# Patient Record
Sex: Female | Born: 1965 | Race: Black or African American | Hispanic: No | Marital: Single | Smoking: Never smoker
Health system: Southern US, Community
[De-identification: ages and names within clinical notes are randomized; demographics above are authoritative.]

## PROBLEM LIST (undated history)

## (undated) DIAGNOSIS — F319 Bipolar disorder, unspecified: Secondary | ICD-10-CM

## (undated) DIAGNOSIS — I10 Essential (primary) hypertension: Secondary | ICD-10-CM

## (undated) DIAGNOSIS — I639 Cerebral infarction, unspecified: Secondary | ICD-10-CM

## (undated) DIAGNOSIS — K922 Gastrointestinal hemorrhage, unspecified: Secondary | ICD-10-CM

## (undated) HISTORY — PX: DILATION AND CURETTAGE OF UTERUS: SHX78

---

## 2007-04-09 ENCOUNTER — Emergency Department: Payer: Self-pay | Admitting: Internal Medicine

## 2007-07-13 ENCOUNTER — Other Ambulatory Visit: Payer: Self-pay

## 2007-07-13 ENCOUNTER — Emergency Department: Payer: Self-pay | Admitting: Emergency Medicine

## 2007-07-30 ENCOUNTER — Emergency Department: Payer: Self-pay | Admitting: Emergency Medicine

## 2007-12-21 ENCOUNTER — Emergency Department: Payer: Self-pay | Admitting: Internal Medicine

## 2007-12-21 ENCOUNTER — Emergency Department: Payer: Self-pay | Admitting: Emergency Medicine

## 2008-08-12 ENCOUNTER — Emergency Department: Payer: Self-pay | Admitting: Emergency Medicine

## 2009-02-15 ENCOUNTER — Emergency Department: Payer: Self-pay | Admitting: Internal Medicine

## 2009-02-16 ENCOUNTER — Emergency Department: Payer: Self-pay | Admitting: Internal Medicine

## 2009-02-17 ENCOUNTER — Emergency Department: Payer: Self-pay | Admitting: Emergency Medicine

## 2009-02-18 ENCOUNTER — Emergency Department: Payer: Self-pay | Admitting: Unknown Physician Specialty

## 2010-04-05 ENCOUNTER — Emergency Department: Payer: Self-pay | Admitting: Internal Medicine

## 2012-05-24 ENCOUNTER — Emergency Department: Payer: Self-pay | Admitting: Emergency Medicine

## 2012-05-24 LAB — URINALYSIS, COMPLETE
Bilirubin,UR: NEGATIVE
Glucose,UR: NEGATIVE mg/dL (ref 0–75)
Ph: 6 (ref 4.5–8.0)
RBC,UR: 4 /HPF (ref 0–5)
Specific Gravity: 1.023 (ref 1.003–1.030)
Squamous Epithelial: 13

## 2012-09-12 ENCOUNTER — Emergency Department: Payer: Self-pay | Admitting: Emergency Medicine

## 2012-10-18 ENCOUNTER — Emergency Department: Payer: Self-pay | Admitting: Emergency Medicine

## 2012-12-12 ENCOUNTER — Emergency Department: Payer: Self-pay | Admitting: Emergency Medicine

## 2012-12-12 LAB — URINALYSIS, COMPLETE
Blood: NEGATIVE
Glucose,UR: NEGATIVE mg/dL (ref 0–75)
Nitrite: NEGATIVE
Protein: NEGATIVE
RBC,UR: 3 /HPF (ref 0–5)
Specific Gravity: 1.02 (ref 1.003–1.030)
WBC UR: 7 /HPF (ref 0–5)

## 2012-12-12 LAB — COMPREHENSIVE METABOLIC PANEL
Alkaline Phosphatase: 69 U/L (ref 50–136)
BUN: 10 mg/dL (ref 7–18)
Calcium, Total: 9.2 mg/dL (ref 8.5–10.1)
Chloride: 103 mmol/L (ref 98–107)
Creatinine: 0.85 mg/dL (ref 0.60–1.30)
Glucose: 129 mg/dL — ABNORMAL HIGH (ref 65–99)
SGPT (ALT): 26 U/L (ref 12–78)
Sodium: 135 mmol/L — ABNORMAL LOW (ref 136–145)
Total Protein: 7.4 g/dL (ref 6.4–8.2)

## 2012-12-12 LAB — CBC
HCT: 40.5 % (ref 35.0–47.0)
MCH: 29.9 pg (ref 26.0–34.0)
MCHC: 33.8 g/dL (ref 32.0–36.0)
RBC: 4.57 10*6/uL (ref 3.80–5.20)

## 2012-12-12 LAB — PREGNANCY, URINE: Pregnancy Test, Urine: NEGATIVE m[IU]/mL

## 2012-12-12 LAB — LIPASE, BLOOD: Lipase: 119 U/L (ref 73–393)

## 2013-06-05 ENCOUNTER — Emergency Department: Payer: Self-pay | Admitting: Internal Medicine

## 2013-06-05 LAB — COMPREHENSIVE METABOLIC PANEL
Albumin: 3.9 g/dL (ref 3.4–5.0)
Anion Gap: 5 — ABNORMAL LOW (ref 7–16)
BUN: 7 mg/dL (ref 7–18)
Bilirubin,Total: 0.8 mg/dL (ref 0.2–1.0)
Calcium, Total: 9 mg/dL (ref 8.5–10.1)
Co2: 28 mmol/L (ref 21–32)
Creatinine: 0.86 mg/dL (ref 0.60–1.30)
EGFR (African American): >60
Glucose: 100 mg/dL — ABNORMAL HIGH (ref 65–99)
Osmolality: 270 (ref 275–301)
SGOT(AST): 30 U/L (ref 15–37)
Sodium: 136 mmol/L (ref 136–145)
Total Protein: 7.5 g/dL (ref 6.4–8.2)

## 2013-06-05 LAB — CBC
HCT: 39.8 % (ref 35.0–47.0)
HGB: 13.7 g/dL (ref 12.0–16.0)
MCH: 30.6 pg (ref 26.0–34.0)
MCHC: 34.4 g/dL (ref 32.0–36.0)
MCV: 89 fL (ref 80–100)
RBC: 4.47 10*6/uL (ref 3.80–5.20)
RDW: 15.1 % — ABNORMAL HIGH (ref 11.5–14.5)
WBC: 7.8 10*3/uL (ref 3.6–11.0)

## 2013-06-05 LAB — TROPONIN I: Troponin-I: <0.02 ng/mL

## 2013-09-08 ENCOUNTER — Emergency Department: Payer: Self-pay | Admitting: Emergency Medicine

## 2013-09-08 LAB — CBC WITH DIFFERENTIAL/PLATELET
Basophil #: 0.1 x10 3/mm 3
Basophil %: 1.2 %
Eosinophil #: 0.2 x10 3/mm 3
Eosinophil %: 2.6 %
HCT: 36.5 %
HGB: 12.3 g/dL
Lymphocyte %: 37 %
Lymphs Abs: 2.4 x10 3/mm 3
MCH: 31 pg
MCHC: 33.8 g/dL
MCV: 92 fL
Monocyte #: 0.4 "x10 3/mm "
Monocyte %: 5.8 %
Neutrophil #: 3.5 x10 3/mm 3
Neutrophil %: 53.4 %
Platelet: 288 x10 3/mm 3
RBC: 3.98 X10 6/mm 3
RDW: 15 % — ABNORMAL HIGH
WBC: 6.5 x10 3/mm 3

## 2013-09-08 LAB — BASIC METABOLIC PANEL WITH GFR
Anion Gap: 4 — ABNORMAL LOW
BUN: 13 mg/dL
Calcium, Total: 8.7 mg/dL
Chloride: 106 mmol/L
Co2: 29 mmol/L
Creatinine: 1.16 mg/dL
EGFR (African American): >60
EGFR (Non-African Amer.): 56 — ABNORMAL LOW
Glucose: 100 mg/dL — ABNORMAL HIGH
Osmolality: 278
Potassium: 3.4 mmol/L — ABNORMAL LOW
Sodium: 139 mmol/L

## 2013-10-01 ENCOUNTER — Emergency Department: Payer: Self-pay | Admitting: Emergency Medicine

## 2014-01-29 ENCOUNTER — Emergency Department: Payer: Self-pay | Admitting: Emergency Medicine

## 2014-01-31 ENCOUNTER — Emergency Department: Payer: Self-pay | Admitting: Emergency Medicine

## 2014-08-24 ENCOUNTER — Emergency Department: Payer: Self-pay | Admitting: Emergency Medicine

## 2014-08-24 LAB — TROPONIN I

## 2014-08-24 LAB — COMPREHENSIVE METABOLIC PANEL
ANION GAP: 6 — AB (ref 7–16)
AST: 24 U/L (ref 15–37)
Albumin: 3.6 g/dL (ref 3.4–5.0)
Alkaline Phosphatase: 58 U/L
BILIRUBIN TOTAL: 0.7 mg/dL (ref 0.2–1.0)
BUN: 10 mg/dL (ref 7–18)
CHLORIDE: 104 mmol/L (ref 98–107)
CREATININE: 0.85 mg/dL (ref 0.60–1.30)
Calcium, Total: 8.6 mg/dL (ref 8.5–10.1)
Co2: 27 mmol/L (ref 21–32)
EGFR (African American): >60
EGFR (Non-African Amer.): >60
Glucose: 115 mg/dL — ABNORMAL HIGH (ref 65–99)
Osmolality: 274 (ref 275–301)
POTASSIUM: 3.2 mmol/L — AB (ref 3.5–5.1)
SGPT (ALT): 32 U/L
SODIUM: 137 mmol/L (ref 136–145)
TOTAL PROTEIN: 6.9 g/dL (ref 6.4–8.2)

## 2014-08-24 LAB — CBC
HCT: 40.5 % (ref 35.0–47.0)
HGB: 13.5 g/dL (ref 12.0–16.0)
MCH: 30.7 pg (ref 26.0–34.0)
MCHC: 33.3 g/dL (ref 32.0–36.0)
MCV: 92 fL (ref 80–100)
Platelet: 347 10*3/uL (ref 150–440)
RBC: 4.4 10*6/uL (ref 3.80–5.20)
RDW: 14.4 % (ref 11.5–14.5)
WBC: 8.2 10*3/uL (ref 3.6–11.0)

## 2014-08-24 IMAGING — CR DG CHEST 1V PORT
1 series · 1 of 1 positions shown · non-contrast
Comparison: [DATE].

CLINICAL DATA: Pt states she has had a cough for a month now,
states about 2 weeks ago she started feeling pain in her chest,
hurts with movement, states the pain is sharp and concerning.

EXAM:
PORTABLE CHEST - 1 VIEW

[ap]
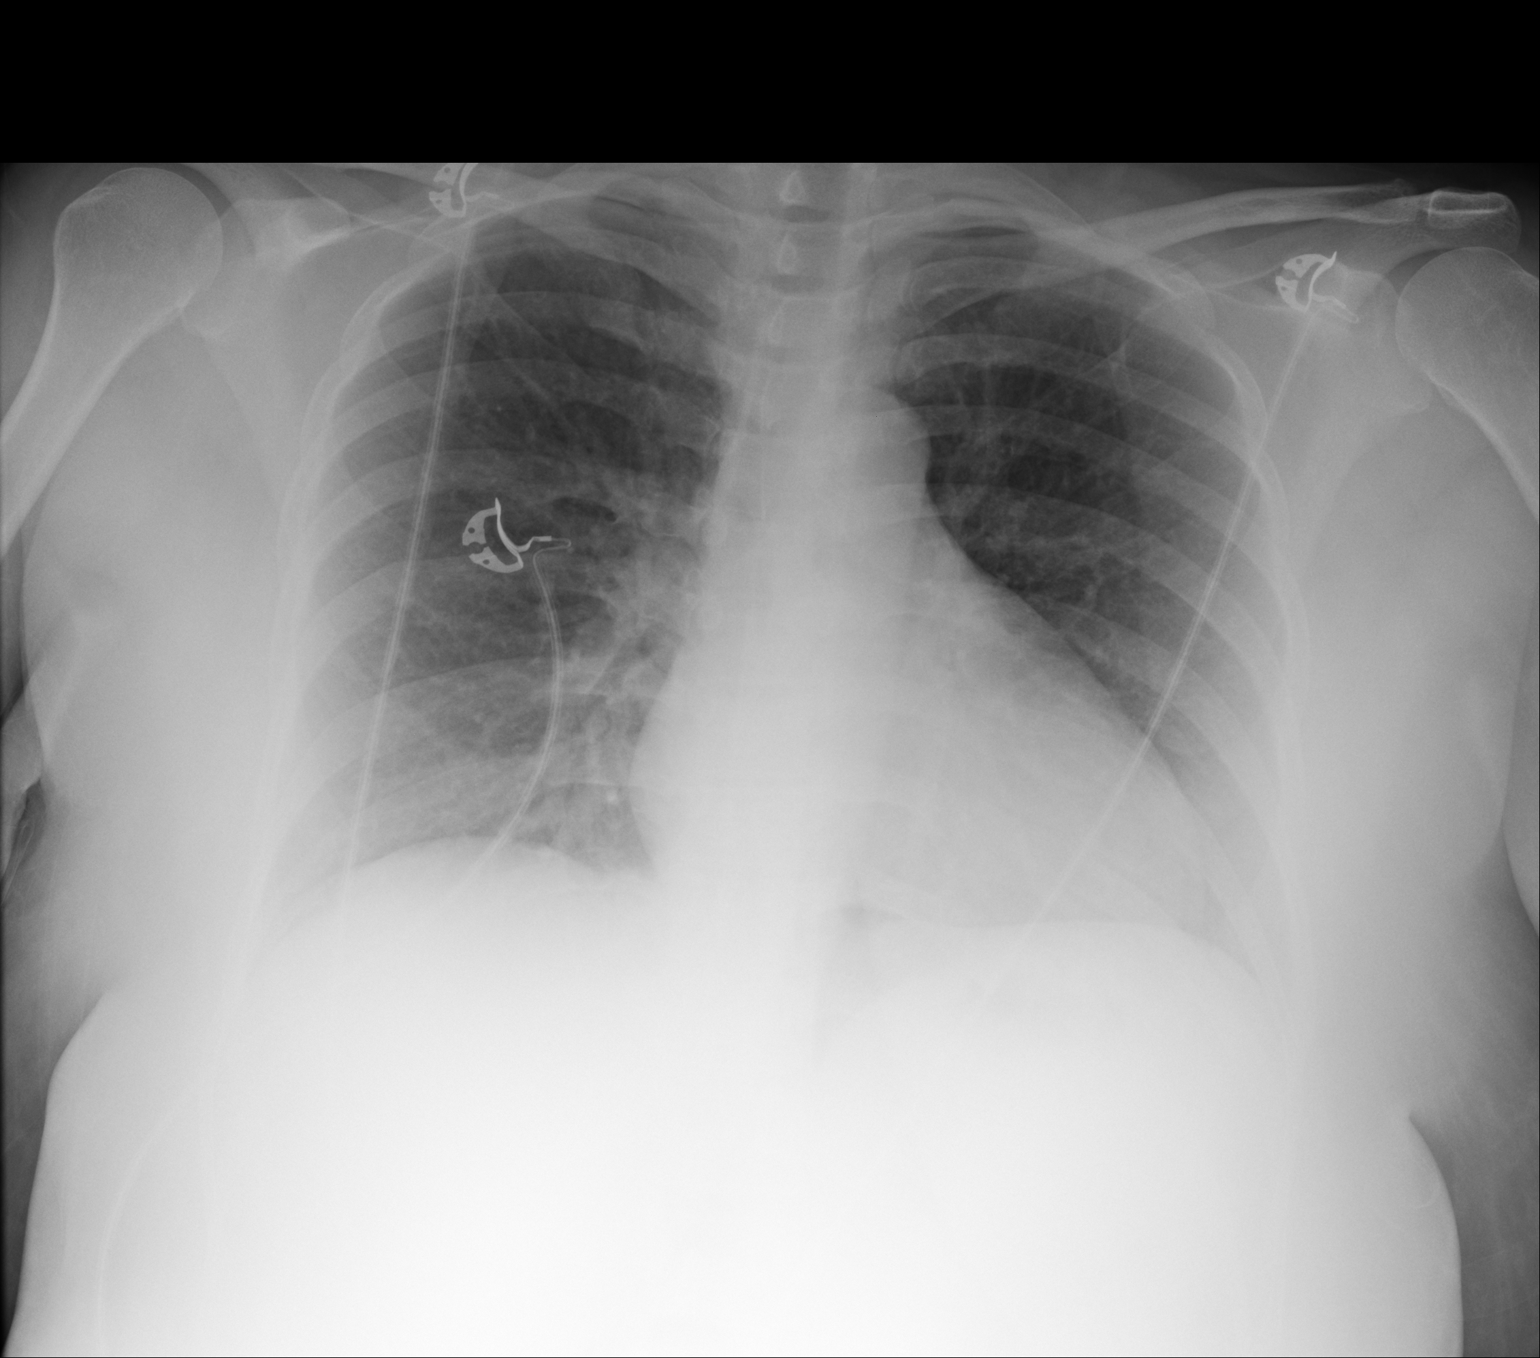

[1 of 1 positions shown; findings below may reference images not displayed]

FINDINGS: The cardiac silhouette remains borderline enlarged. The lungs remain
clear with stable mildly prominent pulmonary vasculature. No pleural
fluid. Unremarkable bones.
IMPRESSION: Stable borderline cardiomegaly and mild pulmonary vascular
congestion. No acute abnormality.

## 2014-12-26 LAB — CBC AND DIFFERENTIAL
HCT: 43 % (ref 36–46)
Hemoglobin: 14.5 g/dL (ref 12.0–16.0)
Neutrophils Absolute: 57 /uL
PLATELETS: 413 10*3/uL — AB (ref 150–399)
WBC: 6 10^3/mL

## 2014-12-26 LAB — LIPID PANEL
CHOLESTEROL: 242 mg/dL — AB (ref 0–200)
HDL: 46 mg/dL (ref 35–70)
LDL Cholesterol: 180 mg/dL
LDL/HDL RATIO: 3.9
Triglycerides: 82 mg/dL (ref 40–160)

## 2014-12-26 LAB — HEPATIC FUNCTION PANEL
ALK PHOS: 67 U/L (ref 25–125)
ALT: 21 U/L (ref 7–35)
AST: 19 U/L (ref 13–35)

## 2014-12-26 LAB — BASIC METABOLIC PANEL
BUN: 11 mg/dL (ref 4–21)
Creatinine: 0.8 mg/dL (ref <=1.1)
Glucose: 99 mg/dL
Potassium: 3.7 mmol/L (ref 3.4–5.3)
Sodium: 140 mmol/L (ref 137–147)

## 2014-12-26 LAB — TSH: TSH: 0.86 u[IU]/mL (ref <=5.90)

## 2015-01-14 DIAGNOSIS — F419 Anxiety disorder, unspecified: Secondary | ICD-10-CM

## 2015-01-14 DIAGNOSIS — E669 Obesity, unspecified: Secondary | ICD-10-CM | POA: Insufficient documentation

## 2015-01-14 DIAGNOSIS — I10 Essential (primary) hypertension: Secondary | ICD-10-CM | POA: Insufficient documentation

## 2015-01-14 DIAGNOSIS — F329 Major depressive disorder, single episode, unspecified: Secondary | ICD-10-CM | POA: Insufficient documentation

## 2015-01-14 DIAGNOSIS — K219 Gastro-esophageal reflux disease without esophagitis: Secondary | ICD-10-CM | POA: Insufficient documentation

## 2015-01-21 ENCOUNTER — Ambulatory Visit: Payer: Self-pay | Admitting: Family Medicine

## 2015-05-30 ENCOUNTER — Emergency Department: Payer: No Typology Code available for payment source

## 2015-05-30 ENCOUNTER — Emergency Department
Admission: EM | Admit: 2015-05-30 | Discharge: 2015-05-30 | Disposition: A | Discharge status: Home / Self Care | Payer: No Typology Code available for payment source | Attending: Student | Admitting: Student

## 2015-05-30 DIAGNOSIS — I10 Essential (primary) hypertension: Secondary | ICD-10-CM | POA: Diagnosis not present

## 2015-05-30 DIAGNOSIS — Y998 Other external cause status: Secondary | ICD-10-CM | POA: Insufficient documentation

## 2015-05-30 DIAGNOSIS — Y9389 Activity, other specified: Secondary | ICD-10-CM | POA: Insufficient documentation

## 2015-05-30 DIAGNOSIS — S8001XA Contusion of right knee, initial encounter: Secondary | ICD-10-CM | POA: Insufficient documentation

## 2015-05-30 DIAGNOSIS — Z79899 Other long term (current) drug therapy: Secondary | ICD-10-CM | POA: Diagnosis not present

## 2015-05-30 DIAGNOSIS — Y92410 Unspecified street and highway as the place of occurrence of the external cause: Secondary | ICD-10-CM | POA: Insufficient documentation

## 2015-05-30 DIAGNOSIS — S8991XA Unspecified injury of right lower leg, initial encounter: Secondary | ICD-10-CM | POA: Diagnosis present

## 2015-05-30 HISTORY — DX: Essential (primary) hypertension: I10

## 2015-05-30 HISTORY — DX: Bipolar disorder, unspecified: F31.9

## 2015-05-30 MED ORDER — HYDROCODONE-ACETAMINOPHEN 5-325 MG PO TABS
1.0000 | ORAL_TABLET | ORAL | Status: AC
  Administered 2015-05-30: 1 via ORAL
  Filled 2015-05-30: qty 1

## 2015-05-30 MED ORDER — MELOXICAM 15 MG PO TABS: 15.0000 mg | ORAL_TABLET | Freq: Every day | ORAL | Status: DC

## 2015-05-30 MED ORDER — IBUPROFEN 800 MG PO TABS
800.0000 mg | ORAL_TABLET | Freq: Once | ORAL | Status: AC
  Administered 2015-05-30: 800 mg via ORAL

## 2015-05-30 MED ORDER — IBUPROFEN 800 MG PO TABS
ORAL_TABLET | ORAL | Status: AC
  Administered 2015-05-30: 800 mg via ORAL
  Filled 2015-05-30: qty 1

## 2015-05-30 NOTE — Discharge Instructions (Signed)
Contusion °A contusion is a deep bruise. Contusions are the result of a blunt injury to tissues and muscle fibers under the skin. The injury causes bleeding under the skin. The skin overlying the contusion may turn blue, purple, or yellow. Minor injuries will give you a painless contusion, but more severe contusions may stay painful and swollen for a few weeks.  °CAUSES  °This condition is usually caused by a blow, trauma, or direct force to an area of the body. °SYMPTOMS  °Symptoms of this condition include: °· Swelling of the injured area. °· Pain and tenderness in the injured area. °· Discoloration. The area may have redness and then turn blue, purple, or yellow. °DIAGNOSIS  °This condition is diagnosed based on a physical exam and medical history. An X-ray, CT scan, or MRI may be needed to determine if there are any associated injuries, such as broken bones (fractures). °TREATMENT  °Specific treatment for this condition depends on what area of the body was injured. In general, the best treatment for a contusion is resting, icing, applying pressure to (compression), and elevating the injured area. This is often called the RICE strategy. Over-the-counter anti-inflammatory medicines may also be recommended for pain control.  °HOME CARE INSTRUCTIONS  °· Rest the injured area. °· If directed, apply ice to the injured area: °· Put ice in a plastic bag. °· Place a towel between your skin and the bag. °· Leave the ice on for 20 minutes, 2-3 times per day. °· If directed, apply light compression to the injured area using an elastic bandage. Make sure the bandage is not wrapped too tightly. Remove and reapply the bandage as directed by your health care provider. °· If possible, raise (elevate) the injured area above the level of your heart while you are sitting or lying down. °· Take over-the-counter and prescription medicines only as told by your health care provider. °SEEK MEDICAL CARE IF: °· Your symptoms do not  improve after several days of treatment. °· Your symptoms get worse. °· You have difficulty moving the injured area. °SEEK IMMEDIATE MEDICAL CARE IF:  °· You have severe pain. °· You have numbness in a hand or foot. °· Your hand or foot turns pale or cold. °  °This information is not intended to replace advice given to you by your health care provider. Make sure you discuss any questions you have with your health care provider. °  °Document Released: 05/05/2005 Document Revised: 04/16/2015 Document Reviewed: 12/11/2014 °Elsevier Interactive Patient Education ©2016 [employer] ° °Cryotherapy °Cryotherapy means treatment with cold. Ice or gel packs can be used to reduce both pain and swelling. Ice is the most helpful within the first 24 to 48 hours after an injury or flare-up from overusing a muscle or joint. Sprains, strains, spasms, burning pain, shooting pain, and aches can all be eased with ice. Ice can also be used when recovering from surgery. Ice is effective, has very few side effects, and is safe for most people to use. °PRECAUTIONS  °Ice is not a safe treatment option for people with: °· Raynaud phenomenon. This is a condition affecting small blood vessels in the extremities. Exposure to cold may cause your problems to return. °· Cold hypersensitivity. There are many forms of cold hypersensitivity, including: °¨ Cold urticaria. Red, itchy hives appear on the skin when the tissues begin to warm after being iced. °¨ Cold erythema. This is a red, itchy rash caused by exposure to cold. °¨ Cold hemoglobinuria. Red blood cells   break down when the tissues begin to warm after being iced. The hemoglobin that carry oxygen are passed into the urine because they cannot combine with blood proteins fast enough. °· Numbness or altered sensitivity in the area being iced. °If you have any of the following conditions, do not use ice until you have discussed cryotherapy with your caregiver: °· Heart conditions, such as  arrhythmia, angina, or chronic heart disease. °· High blood pressure. °· Healing wounds or open skin in the area being iced. °· Current infections. °· Rheumatoid arthritis. °· Poor circulation. °· Diabetes. °Ice slows the blood flow in the region it is applied. This is beneficial when trying to stop inflamed tissues from spreading irritating chemicals to surrounding tissues. However, if you expose your skin to cold temperatures for too long or without the proper protection, you can damage your skin or nerves. Watch for signs of skin damage due to cold. °HOME CARE INSTRUCTIONS °Follow these tips to use ice and cold packs safely. °· Place a dry or damp towel between the ice and skin. A damp towel will cool the skin more quickly, so you may need to shorten the time that the ice is used. °· For a more rapid response, add gentle compression to the ice. °· Ice for no more than 10 to 20 minutes at a time. The bonier the area you are icing, the less time it will take to get the benefits of ice. °· Check your skin after 5 minutes to make sure there are no signs of a poor response to cold or skin damage. °· Rest 20 minutes or more between uses. °· Once your skin is numb, you can end your treatment. You can test numbness by very lightly touching your skin. The touch should be so light that you do not see the skin dimple from the pressure of your fingertip. When using ice, most people will feel these normal sensations in this order: cold, burning, aching, and numbness. °· Do not use ice on someone who cannot communicate their responses to pain, such as small children or people with dementia. °HOW TO MAKE AN ICE PACK °Ice packs are the most common way to use ice therapy. Other methods include ice massage, ice baths, and cryosprays. Muscle creams that cause a cold, tingly feeling do not offer the same benefits that ice offers and should not be used as a substitute unless recommended by your caregiver. °To make an ice pack, do one  of the following: °· Place crushed ice or a bag of frozen vegetables in a sealable plastic bag. Squeeze out the excess air. Place this bag inside another plastic bag. Slide the bag into a pillowcase or place a damp towel between your skin and the bag. °· Mix 3 parts water with 1 part rubbing alcohol. Freeze the mixture in a sealable plastic bag. When you remove the mixture from the freezer, it will be slushy. Squeeze out the excess air. Place this bag inside another plastic bag. Slide the bag into a pillowcase or place a damp towel between your skin and the bag. °SEEK MEDICAL CARE IF: °· You develop white spots on your skin. This may give the skin a blotchy (mottled) appearance. °· Your skin turns blue or pale. °· Your skin becomes waxy or hard. °· Your swelling gets worse. °MAKE SURE YOU:  °· Understand these instructions. °· Will watch your condition. °· Will get help right away if you are not doing well or get worse. °  °  This information is not intended to replace advice given to you by your health care provider. Make sure you discuss any questions you have with your health care provider. °  °Document Released: 03/22/2011 Document Revised: 08/16/2014 Document Reviewed: 03/22/2011 °Elsevier Interactive Patient Education ©2016 Elsevier Inc. ° °
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
## 2016-03-31 ENCOUNTER — Encounter: Payer: Self-pay | Admitting: Emergency Medicine

## 2016-03-31 DIAGNOSIS — F419 Anxiety disorder, unspecified: Secondary | ICD-10-CM | POA: Insufficient documentation

## 2016-03-31 DIAGNOSIS — K8010 Calculus of gallbladder with chronic cholecystitis without obstruction: Principal | ICD-10-CM | POA: Insufficient documentation

## 2016-03-31 DIAGNOSIS — F319 Bipolar disorder, unspecified: Secondary | ICD-10-CM | POA: Insufficient documentation

## 2016-03-31 DIAGNOSIS — I10 Essential (primary) hypertension: Secondary | ICD-10-CM | POA: Insufficient documentation

## 2016-03-31 DIAGNOSIS — Z6829 Body mass index (BMI) 29.0-29.9, adult: Secondary | ICD-10-CM | POA: Insufficient documentation

## 2016-03-31 DIAGNOSIS — K219 Gastro-esophageal reflux disease without esophagitis: Secondary | ICD-10-CM | POA: Insufficient documentation

## 2016-03-31 DIAGNOSIS — E669 Obesity, unspecified: Secondary | ICD-10-CM | POA: Insufficient documentation

## 2016-03-31 LAB — CBC
HCT: 40 % (ref 35.0–47.0)
HEMOGLOBIN: 14.2 g/dL (ref 12.0–16.0)
MCH: 31.7 pg (ref 26.0–34.0)
MCHC: 35.4 g/dL (ref 32.0–36.0)
MCV: 89.6 fL (ref 80.0–100.0)
PLATELETS: 334 10*3/uL (ref 150–440)
RBC: 4.47 MIL/uL (ref 3.80–5.20)
RDW: 14.7 % — ABNORMAL HIGH (ref 11.5–14.5)
WBC: 7.6 10*3/uL (ref 3.6–11.0)

## 2016-03-31 LAB — COMPREHENSIVE METABOLIC PANEL
ALK PHOS: 57 U/L (ref 38–126)
ALT: 18 U/L (ref 14–54)
ANION GAP: 7 (ref 5–15)
AST: 22 U/L (ref 15–41)
Albumin: 4.4 g/dL (ref 3.5–5.0)
BILIRUBIN TOTAL: 0.9 mg/dL (ref 0.3–1.2)
BUN: 10 mg/dL (ref 6–20)
CALCIUM: 9.2 mg/dL (ref 8.9–10.3)
CO2: 28 mmol/L (ref 22–32)
CREATININE: 0.99 mg/dL (ref 0.44–1.00)
Chloride: 104 mmol/L (ref 101–111)
Glucose, Bld: 95 mg/dL (ref 65–99)
Potassium: 3.4 mmol/L — ABNORMAL LOW (ref 3.5–5.1)
SODIUM: 139 mmol/L (ref 135–145)
TOTAL PROTEIN: 7.4 g/dL (ref 6.5–8.1)

## 2016-03-31 LAB — LIPASE, BLOOD: Lipase: 21 U/L (ref 11–51)

## 2016-03-31 LAB — URINALYSIS COMPLETE WITH MICROSCOPIC (ARMC ONLY)
Bacteria, UA: NONE SEEN
Bilirubin Urine: NEGATIVE
Glucose, UA: NEGATIVE mg/dL
HGB URINE DIPSTICK: NEGATIVE
NITRITE: NEGATIVE
PROTEIN: 30 mg/dL — AB
SPECIFIC GRAVITY, URINE: 1.025 (ref 1.005–1.030)
pH: 6 (ref 5.0–8.0)

## 2016-03-31 NOTE — ED Triage Notes (Signed)
Pt presents to ED with c/o mid abdominal pain and epigastric pain for the past 3-4 months, states pain has increased in the last 3 days. Pt reports PCP dx 3 months ago with acid reflux, was given Ranitidine, states is not relieving symptoms. Pt has hx of HTN states did not take blood pressure medicine due to pain. Pt denies chest pain or shortness of breath, denies urinary symptoms. Reports diarrhea for the past 3 days. Pt alert and oriented x 4, no increased work in breathing noted.

## 2016-03-31 NOTE — ED Notes (Signed)
POC urine preg results -NEGATIVE

## 2016-04-01 ENCOUNTER — Observation Stay
Admission: EM | Admit: 2016-04-01 | Discharge: 2016-04-03 | Disposition: A | Discharge status: Home / Self Care | Payer: Self-pay | Attending: Surgery | Admitting: Surgery

## 2016-04-01 ENCOUNTER — Emergency Department: Payer: Self-pay

## 2016-04-01 DIAGNOSIS — R1011 Right upper quadrant pain: Secondary | ICD-10-CM

## 2016-04-01 DIAGNOSIS — K8000 Calculus of gallbladder with acute cholecystitis without obstruction: Secondary | ICD-10-CM

## 2016-04-01 DIAGNOSIS — K810 Acute cholecystitis: Secondary | ICD-10-CM

## 2016-04-01 LAB — COMPREHENSIVE METABOLIC PANEL
ALK PHOS: 55 U/L (ref 38–126)
ALT: 18 U/L (ref 14–54)
ANION GAP: 6 (ref 5–15)
AST: 20 U/L (ref 15–41)
Albumin: 4.2 g/dL (ref 3.5–5.0)
BUN: 12 mg/dL (ref 6–20)
CALCIUM: 9.1 mg/dL (ref 8.9–10.3)
CHLORIDE: 104 mmol/L (ref 101–111)
CO2: 31 mmol/L (ref 22–32)
CREATININE: 0.85 mg/dL (ref 0.44–1.00)
Glucose, Bld: 90 mg/dL (ref 65–99)
Potassium: 3.3 mmol/L — ABNORMAL LOW (ref 3.5–5.1)
SODIUM: 141 mmol/L (ref 135–145)
Total Bilirubin: 0.9 mg/dL (ref 0.3–1.2)
Total Protein: 7.4 g/dL (ref 6.5–8.1)

## 2016-04-01 LAB — CBC
HCT: 40.9 % (ref 35.0–47.0)
HEMOGLOBIN: 13.9 g/dL (ref 12.0–16.0)
MCH: 31.1 pg (ref 26.0–34.0)
MCHC: 33.9 g/dL (ref 32.0–36.0)
MCV: 91.7 fL (ref 80.0–100.0)
PLATELETS: 306 10*3/uL (ref 150–440)
RBC: 4.46 MIL/uL (ref 3.80–5.20)
RDW: 14.8 % — AB (ref 11.5–14.5)
WBC: 7.5 10*3/uL (ref 3.6–11.0)

## 2016-04-01 LAB — SURGICAL PCR SCREEN
MRSA, PCR: NEGATIVE
Staphylococcus aureus: NEGATIVE

## 2016-04-01 LAB — POCT PREGNANCY, URINE: Preg Test, Ur: NEGATIVE

## 2016-04-01 MED ORDER — HYDROCODONE-ACETAMINOPHEN 5-325 MG PO TABS
1.0000 | ORAL_TABLET | ORAL | Status: DC | PRN
  Administered 2016-04-01: 1 via ORAL
  Administered 2016-04-02: 2 via ORAL
  Administered 2016-04-02: 1 via ORAL
  Administered 2016-04-03 (×2): 2 via ORAL
  Filled 2016-04-01: qty 2
  Filled 2016-04-01 (×2): qty 1
  Filled 2016-04-01 (×2): qty 2

## 2016-04-01 MED ORDER — HYDRALAZINE HCL 20 MG/ML IJ SOLN
10.0000 mg | Freq: Four times a day (QID) | INTRAMUSCULAR | Status: DC | PRN
  Administered 2016-04-01 – 2016-04-03 (×2): 10 mg via INTRAVENOUS
  Filled 2016-04-01 (×2): qty 1

## 2016-04-01 MED ORDER — VENLAFAXINE HCL ER 75 MG PO CP24
75.0000 mg | ORAL_CAPSULE | Freq: Every day | ORAL | Status: DC
  Administered 2016-04-01 – 2016-04-03 (×2): 75 mg via ORAL
  Filled 2016-04-01 (×3): qty 1

## 2016-04-01 MED ORDER — LISINOPRIL 10 MG PO TABS
10.0000 mg | ORAL_TABLET | Freq: Every day | ORAL | Status: DC
  Administered 2016-04-01 – 2016-04-03 (×2): 10 mg via ORAL
  Filled 2016-04-01 (×2): qty 1

## 2016-04-01 MED ORDER — CEFAZOLIN IN D5W 1 GM/50ML IV SOLN
1.0000 g | Freq: Four times a day (QID) | INTRAVENOUS | Status: DC
  Administered 2016-04-01 – 2016-04-03 (×9): 1 g via INTRAVENOUS
  Filled 2016-04-01 (×12): qty 50

## 2016-04-01 MED ORDER — ONDANSETRON HCL 4 MG/2ML IJ SOLN: 4.0000 mg | Freq: Four times a day (QID) | INTRAMUSCULAR | Status: DC | PRN

## 2016-04-01 MED ORDER — ONDANSETRON HCL 4 MG/2ML IJ SOLN
4.0000 mg | Freq: Once | INTRAMUSCULAR | Status: AC
  Administered 2016-04-01: 4 mg via INTRAVENOUS
  Filled 2016-04-01: qty 2

## 2016-04-01 MED ORDER — ACETAMINOPHEN 325 MG PO TABS
650.0000 mg | ORAL_TABLET | Freq: Four times a day (QID) | ORAL | Status: DC | PRN
  Administered 2016-04-01: 650 mg via ORAL
  Filled 2016-04-01: qty 2

## 2016-04-01 MED ORDER — LISINOPRIL-HYDROCHLOROTHIAZIDE 10-12.5 MG PO TABS: 1.0000 | ORAL_TABLET | Freq: Every day | ORAL | Status: DC

## 2016-04-01 MED ORDER — HYDROCHLOROTHIAZIDE 12.5 MG PO CAPS
12.5000 mg | ORAL_CAPSULE | Freq: Every day | ORAL | Status: DC
  Administered 2016-04-01 – 2016-04-03 (×2): 12.5 mg via ORAL
  Filled 2016-04-01 (×2): qty 1

## 2016-04-01 MED ORDER — LACTATED RINGERS IV SOLN
INTRAVENOUS | Status: DC
  Administered 2016-04-01 – 2016-04-03 (×6): via INTRAVENOUS

## 2016-04-01 MED ORDER — HEPARIN SODIUM (PORCINE) 5000 UNIT/ML IJ SOLN
5000.0000 [IU] | Freq: Three times a day (TID) | INTRAMUSCULAR | Status: DC
  Administered 2016-04-01 – 2016-04-03 (×7): 5000 [IU] via SUBCUTANEOUS
  Filled 2016-04-01 (×7): qty 1

## 2016-04-01 MED ORDER — MORPHINE SULFATE (PF) 2 MG/ML IV SOLN
2.0000 mg | INTRAVENOUS | Status: DC | PRN
  Administered 2016-04-01 – 2016-04-03 (×9): 2 mg via INTRAVENOUS
  Filled 2016-04-01 (×9): qty 1

## 2016-04-01 MED ORDER — MORPHINE SULFATE (PF) 2 MG/ML IV SOLN
2.0000 mg | Freq: Once | INTRAVENOUS | Status: AC
  Administered 2016-04-01: 2 mg via INTRAVENOUS
  Filled 2016-04-01: qty 1

## 2016-04-01 MED ORDER — ONDANSETRON HCL 4 MG PO TABS: 4.0000 mg | ORAL_TABLET | Freq: Four times a day (QID) | ORAL | Status: DC | PRN

## 2016-04-01 NOTE — Progress Notes (Signed)
Admitted for acute cholecystitis, still having pain for lap chole in am D./w the pt in detail about the procedure, risks and benefits and she agrees. May have clears

## 2016-04-01 NOTE — Care Management (Addendum)
Patient presented with abdominal pain.  Had been treated by her PCP for reflux.  She is diagnosed with acute cholecystitis.  She is scheduled for lap chole 8/25.  She is not employed (looking for a job) and does not have insurance.   Lives with her fiancee who receives disability.  She says she does not have any reason to pursue a disability application.  has been seen by Development worker, community.  Discussed possible medication needs at discharge:  pain med -  usually inexpensive; antibiotic- if needed have physician consider the [facility] generic list; medication management clinic.  Provided application.   Patient  with hypertension and on blood pressure medication   prior to admission . States she has no  dme and no issues accessing medical care [facility]- Dr Rosanna Randy)  , obtaining medications  or with transportation.

## 2016-04-01 NOTE — ED Notes (Signed)
Transporting to room 208-2C

## 2016-04-01 NOTE — ED Notes (Addendum)
Pt states 3-4 months ago she was dx with reflux, started Ranitidine 150mg . States that she eats spicy foods and drinks alcohol occasionally. States "I don't think it's reflux." Pt states pain worse after eating. Pt points to epigastric region when asked where pain is. States she feels burning and will taste food again like it's coming up. States last 3-4  Days pain worse and diarrhea 4x daily. Denies vomiting or fevers. Pt states hx HTN but hasn't taken meds recently.

## 2016-04-01 NOTE — H&P (Signed)
Rachael Aguilar is an 50 y.o. female.    Chief Complaint: Right upper quadrant pain  HPI: This patient with right upper quadrant pain that has been bothering her for 3 months but over the last 3 or 4 days it's been considerably worse and is associated fatty food intolerance but now it is unrelenting and she's having nausea and vomiting no fevers or chills no jaundice or acholic stools. Workup in the emergency room suggest acute cholecystitis  She does not work does not smoke and rarely drinks alcohol Family history is noncontributory  Past Medical History:  Diagnosis Date  . Bipolar depression ([place])   . Hypertension     Past Surgical History:  Procedure Laterality Date  . DILATION AND CURETTAGE OF UTERUS      Family History  Problem Relation Age of Onset  . Ovarian cancer Mother   . Throat cancer Mother   . Hypertension Mother   . Lupus Maternal Grandmother   . Rheum arthritis Maternal Aunt    Social History:  reports that she has never smoked. She has never used smokeless tobacco. She reports that she drinks about 6.0 oz of alcohol per week . Her drug history is not on file.  Allergies: No Known Allergies   (Not in a hospital admission)   Review of Systems  Constitutional: Negative for chills and fever.  HENT: Negative.   Eyes: Negative.   Respiratory: Negative.   Cardiovascular: Negative.   Gastrointestinal: Positive for abdominal pain, nausea and vomiting. Negative for blood in stool, constipation, diarrhea, heartburn and melena.  Genitourinary: Negative.   Musculoskeletal: Negative.   Skin: Negative.   Neurological: Negative.   Endo/Heme/Allergies: Negative.   Psychiatric/Behavioral: Negative.      Physical Exam:  BP (!) 160/107   Pulse 64   Temp 98.3 F (36.8 C) (Oral)   Resp 18   Ht 5' 4"  (1.626 m)   Wt 171 lb (77.6 kg)   LMP 03/14/2016 (Within Weeks)   SpO2 100%   BMI 29.35 kg/m   Physical Exam  Constitutional: She is oriented to person, place,  and time and well-developed, well-nourished, and in no distress. No distress.  HENT:  Head: Normocephalic and atraumatic.  Eyes: Right eye exhibits no discharge. Left eye exhibits no discharge. No scleral icterus.  Neck: Normal range of motion.  Cardiovascular: Normal rate, regular rhythm and normal heart sounds.   Pulmonary/Chest: Effort normal and breath sounds normal. No respiratory distress. She has no wheezes. She has no rales.  Abdominal: Soft. She exhibits no distension. There is tenderness. There is no rebound and no guarding.  Positive Murphy sign  Musculoskeletal: Normal range of motion. She exhibits no edema or tenderness.  Lymphadenopathy:    She has no cervical adenopathy.  Neurological: She is alert and oriented to person, place, and time.  Skin: Skin is warm and dry. No rash noted. She is not diaphoretic. No erythema.  Psychiatric: Mood and affect normal.  Vitals reviewed.       Results for orders placed or performed during the hospital encounter of 04/01/16 (from the past 48 hour(s))  Lipase, blood     Status: None   Collection Time: 03/31/16 10:50 PM  Result Value Ref Range   Lipase 21 11 - 51 U/L  Comprehensive metabolic panel     Status: Abnormal   Collection Time: 03/31/16 10:50 PM  Result Value Ref Range   Sodium 139 135 - 145 mmol/L   Potassium 3.4 (L) 3.5 - 5.1  mmol/L   Chloride 104 101 - 111 mmol/L   CO2 28 22 - 32 mmol/L   Glucose, Bld 95 65 - 99 mg/dL   BUN 10 6 - 20 mg/dL   Creatinine, Ser 0.99 0.44 - 1.00 mg/dL   Calcium 9.2 8.9 - 10.3 mg/dL   Total Protein 7.4 6.5 - 8.1 g/dL   Albumin 4.4 3.5 - 5.0 g/dL   AST 22 15 - 41 U/L   ALT 18 14 - 54 U/L   Alkaline Phosphatase 57 38 - 126 U/L   Total Bilirubin 0.9 0.3 - 1.2 mg/dL   GFR calc non Af Amer >60 >60 mL/min   GFR calc Af Amer >60 >60 mL/min    Comment: (NOTE) The eGFR has been calculated using the CKD EPI equation. This calculation has not been validated in all clinical situations. eGFR's  persistently <60 mL/min signify possible Chronic Kidney Disease.    Anion gap 7 5 - 15  CBC     Status: Abnormal   Collection Time: 03/31/16 10:50 PM  Result Value Ref Range   WBC 7.6 3.6 - 11.0 K/uL   RBC 4.47 3.80 - 5.20 MIL/uL   Hemoglobin 14.2 12.0 - 16.0 g/dL   HCT 40.0 35.0 - 47.0 %   MCV 89.6 80.0 - 100.0 fL   MCH 31.7 26.0 - 34.0 pg   MCHC 35.4 32.0 - 36.0 g/dL   RDW 14.7 (H) 11.5 - 14.5 %   Platelets 334 150 - 440 K/uL  Urinalysis complete, with microscopic     Status: Abnormal   Collection Time: 03/31/16 10:51 PM  Result Value Ref Range   Color, Urine YELLOW (A) YELLOW   APPearance HAZY (A) CLEAR   Glucose, UA NEGATIVE NEGATIVE mg/dL   Bilirubin Urine NEGATIVE NEGATIVE   Ketones, ur TRACE (A) NEGATIVE mg/dL   Specific Gravity, Urine 1.025 1.005 - 1.030   Hgb urine dipstick NEGATIVE NEGATIVE   pH 6.0 5.0 - 8.0   Protein, ur 30 (A) NEGATIVE mg/dL   Nitrite NEGATIVE NEGATIVE   Leukocytes, UA 3+ (A) NEGATIVE   RBC / HPF 6-30 0 - 5 RBC/hpf   WBC, UA TOO NUMEROUS TO COUNT 0 - 5 WBC/hpf   Bacteria, UA NONE SEEN NONE SEEN   Squamous Epithelial / LPF 6-30 (A) NONE SEEN   US Abdomen Limited Ruq  Result Date: 04/01/2016 CLINICAL DATA:  Right upper quadrant pain. Pain for months, worse over the past 3 days. EXAM: US ABDOMEN LIMITED - RIGHT UPPER QUADRANT COMPARISON:  None. FINDINGS: Gallbladder: Physiologically distended. Multiple small shadowing gallstones. There is gallbladder wall thickening measuring 3-4 mm. A positive sonographic Murphy sign noted by sonographer with tenderness scanning over the gallbladder. Common bile duct: Diameter: 4.3 mm Liver: No focal lesion identified. Within normal limits in parenchymal echogenicity. Normal directional flow in the main portal vein. IMPRESSION: Cholelithiasis with gallbladder wall thickening. In conjunction with a positive sonographic Murphy sign, findings are concerning for acute cholecystitis. No biliary dilatation. Electronically  Signed   By: Jeb Levering M.D.   On: 04/01/2016 02:28     Assessment/Plan  Labs and ultrasound personally reviewed. Agree with the diagnosis of acute cholecystitis. Discussed with the patient and her family member the options of observation and the rationale for admission to the hospital starting IV antibiotics and planning surgery in the next 24-36 hours. This is all reviewed for her I also discussed with her with her the risks of bleeding infection recurrence of symptoms failure to  resolve her symptoms (procedure bile duct damage bile duct leak any of which could require further surgery and that Dr. Perrin Maltese would be performing her surgery when OR time became available she understood and agreed to proceed  Florene Glen, MD, FACS

## 2016-04-01 NOTE — ED Provider Notes (Signed)
[facility] Emergency Department Provider Note  ____________________________________________   First MD Initiated Contact with Patient 04/01/16 0134     (approximate)  I have reviewed the triage vital signs and the nursing notes.   HISTORY  Chief Complaint Abdominal Pain    HPI Rachael Aguilar is a 50 y.o. female presents with 3-4 month history of intermittent upper abdominal pain associated with nausea. Patient states over the past 3 days however pain has worsened unrelieved with ranitidine which was prescribed by her PCP. Patient states current pain score is 10 out of 10. She denies any fever no diarrhea and. Patient does however admit to urinary frequency and urgency.   Past Medical History:  Diagnosis Date  . Bipolar depression ([place])   . Hypertension     Patient Active Problem List   Diagnosis Date Noted  . Anxiety and depression 01/14/2015  . Gastro-esophageal reflux disease without esophagitis 01/14/2015  . BP (high blood pressure) 01/14/2015  . Adiposity 01/14/2015    Past Surgical History:  Procedure Laterality Date  . DILATION AND CURETTAGE OF UTERUS      Prior to Admission medications   Medication Sig Start Date End Date Taking? Authorizing Provider  lisinopril-hydrochlorothiazide (PRINZIDE,ZESTORETIC) 10-12.5 MG per tablet Take by mouth. 12/26/14   Historical Provider, MD  meloxicam (MOBIC) 15 MG tablet Take 1 tablet (15 mg total) by mouth daily. 05/30/15   Duanne Guess, PA-C  ranitidine (ZANTAC) 150 MG capsule Take by mouth. 12/26/14   Historical Provider, MD  venlafaxine XR (EFFEXOR-XR) 75 MG 24 hr capsule Take by mouth. 12/26/14   Historical Provider, MD    Allergies No known drug allergies  Family History  Problem Relation Age of Onset  . Ovarian cancer Mother   . Throat cancer Mother   . Hypertension Mother   . Lupus Maternal Grandmother   . Rheum arthritis Maternal Aunt     Social History Social History  Substance  Use Topics  . Smoking status: Never Smoker  . Smokeless tobacco: Never Used  . Alcohol use 6.0 oz/week    10 Shots of liquor per week    Review of Systems Constitutional: No fever/chills Eyes: No visual changes. ENT: No sore throat. Cardiovascular: Denies chest pain. Respiratory: Denies shortness of breath. Gastrointestinal: No abdominal pain.  No nausea, no vomiting.  No diarrhea.  No constipation. Genitourinary: Negative for dysuria. Musculoskeletal: Negative for back pain. Skin: Negative for rash. Neurological: Negative for headaches, focal weakness or numbness.  10-point ROS otherwise negative.  ____________________________________________   PHYSICAL EXAM:  VITAL SIGNS: ED Triage Vitals  Enc Vitals Group     BP 03/31/16 2245 (!) 165/102     Pulse Rate 03/31/16 2245 78     Resp 03/31/16 2245 18     Temp 03/31/16 2245 98.3 F (36.8 C)     Temp Source 03/31/16 2245 Oral     SpO2 03/31/16 2245 98 %     Weight 03/31/16 2246 171 lb (77.6 kg)     Height 03/31/16 2246 5\' 4"  (1.626 m)     Head Circumference --      Peak Flow --      Pain Score 03/31/16 2247 10     Pain Loc --      Pain Edu? --      Excl. in [place]? --     Constitutional: Alert and oriented. Well appearing and in no acute distress. Eyes: Conjunctivae are normal. PERRL. EOMI. Head: Atraumatic. Mouth/Throat: Mucous  membranes are moist.  Oropharynx non-erythematous. Neck: No stridor.  No meningeal signs.   Cardiovascular: Normal rate, regular rhythm. Good peripheral circulation. Grossly normal heart sounds. Respiratory: Normal respiratory effort.  No retractions. Lungs CTAB. Gastrointestinal: Soft and nontender. No distention.  Musculoskeletal: No lower extremity tenderness nor edema. No gross deformities of extremities. Neurologic:  Normal speech and language. No gross focal neurologic deficits are appreciated.  Skin:  Skin is warm, dry and intact. No rash noted. Psychiatric: Mood and affect are normal.  Speech and behavior are normal.  ____________________________________________   LABS (all labs ordered are listed, but only abnormal results are displayed)  Labs Reviewed  COMPREHENSIVE METABOLIC PANEL - Abnormal; Notable for the following:       Result Value   Potassium 3.4 (*)    All other components within normal limits  CBC - Abnormal; Notable for the following:    RDW 14.7 (*)    All other components within normal limits  URINALYSIS COMPLETEWITH MICROSCOPIC ([facility] ONLY) - Abnormal; Notable for the following:    Color, Urine YELLOW (*)    APPearance HAZY (*)    Ketones, ur TRACE (*)    Protein, ur 30 (*)    Leukocytes, UA 3+ (*)    Squamous Epithelial / LPF 6-30 (*)    All other components within normal limits  LIPASE, BLOOD  POC URINE PREG, ED    RADIOLOGY I, Sherrard N BROWN, personally viewed and evaluated these images (plain radiographs) as part of my medical decision making, as well as reviewing the written report by the radiologist.  US Abdomen Limited Ruq  Result Date: 04/01/2016 CLINICAL DATA:  Right upper quadrant pain. Pain for months, worse over the past 3 days. EXAM: US ABDOMEN LIMITED - RIGHT UPPER QUADRANT COMPARISON:  None. FINDINGS: Gallbladder: Physiologically distended. Multiple small shadowing gallstones. There is gallbladder wall thickening measuring 3-4 mm. A positive sonographic Murphy sign noted by sonographer with tenderness scanning over the gallbladder. Common bile duct: Diameter: 4.3 mm Liver: No focal lesion identified. Within normal limits in parenchymal echogenicity. Normal directional flow in the main portal vein. IMPRESSION: Cholelithiasis with gallbladder wall thickening. In conjunction with a positive sonographic Murphy sign, findings are concerning for acute cholecystitis. No biliary dilatation. Electronically Signed   By: Jeb Levering M.D.   On: 04/01/2016 02:28     Procedures     INITIAL IMPRESSION / ASSESSMENT AND PLAN / ED  COURSE  Pertinent labs & imaging results that were available during my care of the patient were reviewed by me and considered in my medical decision making (see chart for details).  History, physical exam and ultrasound findings consistent with possible acute cholecystitis as such patient discussed with Dr. Burt Knack general surgeon on call who evaluated the patient with plan for admission for further management.   Clinical Course    ____________________________________________  FINAL CLINICAL IMPRESSION(S) / ED DIAGNOSES  Final diagnoses:  Acute cholecystitis  Calculus of gallbladder with acute cholecystitis without obstruction     MEDICATIONS GIVEN DURING THIS VISIT:  Medications  morphine 2 MG/ML injection 2 mg (2 mg Intravenous Given 04/01/16 0321)  ondansetron (ZOFRAN) injection 4 mg (4 mg Intravenous Given 04/01/16 0321)     NEW OUTPATIENT MEDICATIONS STARTED DURING THIS VISIT:  New Prescriptions   No medications on file      Note:  This document was prepared using Dragon voice recognition software and may include unintentional dictation errors.    Gregor Hams, MD 04/01/16 0330

## 2016-04-02 ENCOUNTER — Observation Stay: Payer: Self-pay | Admitting: Anesthesiology

## 2016-04-02 ENCOUNTER — Encounter
Admission: EM | Disposition: A | Discharge status: Home / Self Care | Payer: Self-pay | Source: Home / Self Care | Attending: Emergency Medicine

## 2016-04-02 ENCOUNTER — Encounter: Payer: Self-pay | Admitting: Anesthesiology

## 2016-04-02 DIAGNOSIS — K810 Acute cholecystitis: Secondary | ICD-10-CM

## 2016-04-02 HISTORY — PX: CHOLECYSTECTOMY: SHX55

## 2016-04-02 SURGERY — LAPAROSCOPIC CHOLECYSTECTOMY
Anesthesia: General | Site: Abdomen | Wound class: Clean

## 2016-04-02 MED ORDER — MIDAZOLAM HCL 5 MG/5ML IJ SOLN
INTRAMUSCULAR | Status: DC | PRN
  Administered 2016-04-02: 2 mg via INTRAVENOUS

## 2016-04-02 MED ORDER — FENTANYL CITRATE (PF) 100 MCG/2ML IJ SOLN
INTRAMUSCULAR | Status: DC | PRN
  Administered 2016-04-02 (×2): 50 ug via INTRAVENOUS
  Administered 2016-04-02: 100 ug via INTRAVENOUS

## 2016-04-02 MED ORDER — ONDANSETRON HCL 4 MG/2ML IJ SOLN
INTRAMUSCULAR | Status: DC | PRN
  Administered 2016-04-02: 4 mg via INTRAVENOUS

## 2016-04-02 MED ORDER — DEXAMETHASONE SODIUM PHOSPHATE 10 MG/ML IJ SOLN
INTRAMUSCULAR | Status: DC | PRN
  Administered 2016-04-02: 10 mg via INTRAVENOUS

## 2016-04-02 MED ORDER — CHLORHEXIDINE GLUCONATE CLOTH 2 % EX PADS: 6.0000 | MEDICATED_PAD | Freq: Once | CUTANEOUS | Status: DC

## 2016-04-02 MED ORDER — ACETAMINOPHEN 10 MG/ML IV SOLN
INTRAVENOUS | Status: DC | PRN
  Administered 2016-04-02: 1000 mg via INTRAVENOUS

## 2016-04-02 MED ORDER — FENTANYL CITRATE (PF) 100 MCG/2ML IJ SOLN
25.0000 ug | INTRAMUSCULAR | Status: DC | PRN
  Administered 2016-04-02 (×4): 25 ug via INTRAVENOUS

## 2016-04-02 MED ORDER — PROPOFOL 10 MG/ML IV BOLUS
INTRAVENOUS | Status: DC | PRN
  Administered 2016-04-02: 170 mg via INTRAVENOUS

## 2016-04-02 MED ORDER — SUCCINYLCHOLINE CHLORIDE 20 MG/ML IJ SOLN
INTRAMUSCULAR | Status: DC | PRN
  Administered 2016-04-02: 120 mg via INTRAVENOUS

## 2016-04-02 MED ORDER — ROCURONIUM BROMIDE 100 MG/10ML IV SOLN
INTRAVENOUS | Status: DC | PRN
  Administered 2016-04-02: 20 mg via INTRAVENOUS
  Administered 2016-04-02: 10 mg via INTRAVENOUS

## 2016-04-02 MED ORDER — OXYCODONE HCL 5 MG/5ML PO SOLN
5.0000 mg | Freq: Once | ORAL | Status: DC | PRN
Start: 2016-04-02 — End: 2016-04-02

## 2016-04-02 MED ORDER — FENTANYL CITRATE (PF) 100 MCG/2ML IJ SOLN
INTRAMUSCULAR | Status: AC
  Administered 2016-04-02: 25 ug via INTRAVENOUS
  Filled 2016-04-02: qty 2

## 2016-04-02 MED ORDER — SUGAMMADEX SODIUM 200 MG/2ML IV SOLN
INTRAVENOUS | Status: DC | PRN
  Administered 2016-04-02: 160 mg via INTRAVENOUS

## 2016-04-02 MED ORDER — BUPIVACAINE-EPINEPHRINE 0.25% -1:200000 IJ SOLN
INTRAMUSCULAR | Status: DC | PRN
  Administered 2016-04-02: 30 mL

## 2016-04-02 MED ORDER — LIDOCAINE HCL (CARDIAC) 20 MG/ML IV SOLN
INTRAVENOUS | Status: DC | PRN
  Administered 2016-04-02: 60 mg via INTRAVENOUS

## 2016-04-02 MED ORDER — KETOROLAC TROMETHAMINE 30 MG/ML IJ SOLN
INTRAMUSCULAR | Status: DC | PRN
  Administered 2016-04-02: 30 mg via INTRAVENOUS

## 2016-04-02 MED ORDER — OXYCODONE HCL 5 MG PO TABS: 5.0000 mg | ORAL_TABLET | Freq: Once | ORAL | Status: DC | PRN

## 2016-04-02 SURGICAL SUPPLY — 44 items
APPLICATOR COTTON TIP 6IN STRL (MISCELLANEOUS) ×3 IMPLANT
APPLIER CLIP 5 13 M/L LIGAMAX5 (MISCELLANEOUS) ×3
BLADE SURG 15 STRL LF DISP TIS (BLADE) ×1 IMPLANT
BLADE SURG 15 STRL SS (BLADE) ×2
CANISTER SUCT 1200ML W/VALVE (MISCELLANEOUS) ×3 IMPLANT
CHLORAPREP W/TINT 26ML (MISCELLANEOUS) ×3 IMPLANT
CHOLANGIOGRAM CATH TAUT (CATHETERS) IMPLANT
CLEANER CAUTERY TIP 5X5 PAD (MISCELLANEOUS) ×1 IMPLANT
CLIP APPLIE 5 13 M/L LIGAMAX5 (MISCELLANEOUS) ×1 IMPLANT
DECANTER SPIKE VIAL GLASS SM (MISCELLANEOUS) ×6 IMPLANT
DEVICE TROCAR PUNCTURE CLOSURE (ENDOMECHANICALS) IMPLANT
DRAPE C-ARM XRAY 36X54 (DRAPES) ×3 IMPLANT
ELECT REM PT RETURN 9FT ADLT (ELECTROSURGICAL) ×3
ELECTRODE REM PT RTRN 9FT ADLT (ELECTROSURGICAL) ×1 IMPLANT
ENDOPOUCH RETRIEVER 10 (MISCELLANEOUS) ×3 IMPLANT
GLOVE BIO SURGEON STRL SZ7 (GLOVE) ×3 IMPLANT
GOWN STRL REUS W/ TWL LRG LVL3 (GOWN DISPOSABLE) ×3 IMPLANT
GOWN STRL REUS W/TWL LRG LVL3 (GOWN DISPOSABLE) ×6
IRRIGATION STRYKERFLOW (MISCELLANEOUS) ×1 IMPLANT
IRRIGATOR STRYKERFLOW (MISCELLANEOUS) ×3
IV CATH ANGIO 12GX3 LT BLUE (NEEDLE) ×3 IMPLANT
IV NS 1000ML (IV SOLUTION) ×2
IV NS 1000ML BAXH (IV SOLUTION) ×1 IMPLANT
L-HOOK LAP DISP 36CM (ELECTROSURGICAL) ×3
LHOOK LAP DISP 36CM (ELECTROSURGICAL) ×1 IMPLANT
LIQUID BAND (GAUZE/BANDAGES/DRESSINGS) ×3 IMPLANT
NEEDLE HYPO 22GX1.5 SAFETY (NEEDLE) ×3 IMPLANT
PACK LAP CHOLECYSTECTOMY (MISCELLANEOUS) ×3 IMPLANT
PAD CLEANER CAUTERY TIP 5X5 (MISCELLANEOUS) ×2
PENCIL ELECTRO HAND CTR (MISCELLANEOUS) ×3 IMPLANT
SCISSORS METZENBAUM CVD 33 (INSTRUMENTS) ×3 IMPLANT
SLEEVE ENDOPATH XCEL 5M (ENDOMECHANICALS) ×6 IMPLANT
SOL ANTI-FOG 6CC FOG-OUT (MISCELLANEOUS) ×1 IMPLANT
SOL FOG-OUT ANTI-FOG 6CC (MISCELLANEOUS) ×2
STOPCOCK 3 WAY  REPLAC (MISCELLANEOUS) IMPLANT
SUT ETHIBOND 0 MO6 C/R (SUTURE) IMPLANT
SUT MNCRL AB 4-0 PS2 18 (SUTURE) ×3 IMPLANT
SUT VIC AB 0 CT2 27 (SUTURE) IMPLANT
SUT VICRYL 0 AB UR-6 (SUTURE) ×6 IMPLANT
SYR 20CC LL (SYRINGE) ×3 IMPLANT
TROCAR XCEL BLUNT TIP 100MML (ENDOMECHANICALS) ×3 IMPLANT
TROCAR XCEL NON-BLD 5MMX100MML (ENDOMECHANICALS) ×3 IMPLANT
TUBING INSUFFLATOR HI FLOW (MISCELLANEOUS) ×3 IMPLANT
WATER STERILE IRR 1000ML POUR (IV SOLUTION) ×3 IMPLANT

## 2016-04-02 NOTE — Transfer of Care (Signed)
Immediate Anesthesia Transfer of Care Note  Patient: Rachael Aguilar  Procedure(s) Performed: Procedure(s): LAPAROSCOPIC CHOLECYSTECTOMY (N/A)  Patient Location: PACU  Anesthesia Type:General  Level of Consciousness: sedated  Airway & Oxygen Therapy: Patient Spontanous Breathing and Patient connected to face mask oxygen  Post-op Assessment: Report given to RN and Post -op Vital signs reviewed and stable  Post vital signs: Reviewed and stable  Last Vitals:  Vitals:   04/02/16 1004 04/02/16 1125  BP: (!) 161/105 138/86  Pulse: 62 96  Resp: 16 20  Temp: 36.4 C 36.3 C    Last Pain:  Vitals:   04/02/16 1125  TempSrc:   PainSc: Asleep      Patients Stated Pain Goal: 0 ([id number])  Complications: No apparent anesthesia complications

## 2016-04-02 NOTE — Anesthesia Preprocedure Evaluation (Signed)
Anesthesia Evaluation  Patient identified by MRN, date of birth, ID band Patient awake    Reviewed: Allergy & Precautions, H&P , NPO status , Patient's Chart, lab work & pertinent test results  History of Anesthesia Complications Negative for: history of anesthetic complications  Airway Mallampati: III  TM Distance: >3 FB Neck ROM: full    Dental no notable dental hx. (+) Poor Dentition, Chipped   Pulmonary neg pulmonary ROS, neg shortness of breath,    Pulmonary exam normal breath sounds clear to auscultation       Cardiovascular Exercise Tolerance: Good hypertension, (-) angina(-) Past MI and (-) DOE Normal cardiovascular exam Rhythm:regular Rate:Normal     Neuro/Psych PSYCHIATRIC DISORDERS Depression Bipolar Disorder negative neurological ROS     GI/Hepatic Neg liver ROS, GERD  Controlled,  Endo/Other  negative endocrine ROS  Renal/GU      Musculoskeletal   Abdominal   Peds  Hematology negative hematology ROS (+)   Anesthesia Other Findings Past Medical History: No date: Bipolar depression ([place]) No date: Hypertension  Past Surgical History: No date: DILATION AND CURETTAGE OF UTERUS  BMI    Body Mass Index:  29.13 kg/m      Reproductive/Obstetrics negative OB ROS                            Anesthesia Physical Anesthesia Plan  ASA: III  Anesthesia Plan: General ETT   Post-op Pain Management:    Induction:   Airway Management Planned:   Additional Equipment:   Intra-op Plan:   Post-operative Plan:   Informed Consent: I have reviewed the patients History and Physical, chart, labs and discussed the procedure including the risks, benefits and alternatives for the proposed anesthesia with the patient or authorized representative who has indicated his/her understanding and acceptance.     Plan Discussed with: Anesthesiologist, CRNA and Surgeon  Anesthesia Plan  Comments:         Anesthesia Quick Evaluation

## 2016-04-02 NOTE — Anesthesia Procedure Notes (Signed)
Procedure Name: Intubation Date/Time: 04/02/2016 10:30 AM Performed by: Dionne Bucy Pre-anesthesia Checklist: Patient identified, Patient being monitored, Timeout performed, Emergency Drugs available and Suction available Patient Re-evaluated:Patient Re-evaluated prior to inductionOxygen Delivery Method: Circle system utilized Preoxygenation: Pre-oxygenation with 100% oxygen Intubation Type: IV induction Ventilation: Mask ventilation without difficulty Laryngoscope Size: Mac and 3 Grade View: Grade I Tube type: Oral Tube size: 7.0 mm Number of attempts: 1 Airway Equipment and Method: Stylet Placement Confirmation: ETT inserted through vocal cords under direct vision,  positive ETCO2 and breath sounds checked- equal and bilateral Secured at: 21 cm Tube secured with: Tape Dental Injury: Teeth and Oropharynx as per pre-operative assessment

## 2016-04-02 NOTE — Progress Notes (Signed)
Seen and examined for lap chole today for cholecystitis. D/W the pt in detail. She wishes to proceed

## 2016-04-02 NOTE — Op Note (Addendum)
Laparoscopic Cholecystectomy  Pre-operative Diagnosis: Acute cholecystitis  Post-operative Diagnosis: Same  Procedure: Laparoscopic cholecystectomy  Surgeon: Caroleen Hamman, MD FACS  Anesthesia: Gen. with endotracheal tube   Findings: Mild Acute Cholecystitis   Estimated Blood Loss: 5cc         Drains: none         Specimens: Gallbladder           Complications: none   Procedure Details  The patient was seen again in the Holding Room. The benefits, complications, treatment options, and expected outcomes were discussed with the patient. The risks of bleeding, infection, recurrence of symptoms, failure to resolve symptoms, bile duct damage, bile duct leak, retained common bile duct stone, bowel injury, any of which could require further surgery and/or ERCP, stent, or papillotomy were reviewed with the patient. The likelihood of improving the patient's symptoms with return to their baseline status is good.  The patient and/or family concurred with the proposed plan, giving informed consent.  The patient was taken to Operating Room, identified as RAMSHA COLLER and the procedure verified as Laparoscopic Cholecystectomy.  A Time Out was held and the above information confirmed.  Prior to the induction of general anesthesia, antibiotic prophylaxis was administered. VTE prophylaxis was in place. General endotracheal anesthesia was then administered and tolerated well. After the induction, the abdomen was prepped with Chloraprep and draped in the sterile fashion. The patient was positioned in the supine position.  Local anesthetic  was injected into the skin near the umbilicus and an incision made. Cut down technique was used to enter the abdominal cavity and a Hasson trochar was placed after two vicryl stitches were anchored to the fascia. Pneumoperitoneum was then created with CO2 and tolerated well without any adverse changes in the patient's vital signs.  Three 5-mm ports were placed in the  right upper quadrant all under direct vision. All skin incisions  were infiltrated with a local anesthetic agent before making the incision and placing the trocars.   The patient was positioned  in reverse Trendelenburg, tilted slightly to the patient's left.  The gallbladder was identified, the fundus grasped and retracted cephalad. Adhesions were lysed bluntly. The infundibulum was grasped and retracted laterally, exposing the peritoneum overlying the triangle of Calot. This was then divided and exposed in a blunt fashion. An extended critical view of the cystic duct and cystic artery was obtained.  The cystic duct was clearly identified and bluntly dissected.   Artery and duct were double clipped and divided.  The gallbladder was taken from the gallbladder fossa in a retrograde fashion with the electrocautery. The gallbladder was removed and placed in an Endocatch bag. The liver bed was irrigated and inspected. Hemostasis was achieved with the electrocautery. Copious irrigation was utilized and was repeatedly aspirated until clear.  The gallbladder and Endocatch sac were then removed through the umbilical port site.    Inspection of the right upper quadrant was performed. No bleeding, bile duct injury or leak, or bowel injury was noted. Pneumoperitoneum was released.  The periumbilical port site was closed with figure-of-eight 0 Vicryl sutures. 4-0 subcuticular Monocryl was used to close the skin. Dermabond was  applied.  The patient was then extubated and brought to the recovery room in stable condition. Sponge, lap, and needle counts were correct at closure and at the conclusion of the case.               Caroleen Hamman, MD, FACS

## 2016-04-02 NOTE — Anesthesia Postprocedure Evaluation (Signed)
Anesthesia Post Note  Patient: Rachael Aguilar  Procedure(s) Performed: Procedure(s) (LRB): LAPAROSCOPIC CHOLECYSTECTOMY (N/A)  Patient location during evaluation: PACU Anesthesia Type: General Level of consciousness: awake and alert Pain management: pain level controlled Vital Signs Assessment: post-procedure vital signs reviewed and stable Respiratory status: spontaneous breathing, nonlabored ventilation, respiratory function stable and patient connected to nasal cannula oxygen Cardiovascular status: blood pressure returned to baseline and stable Postop Assessment: no signs of nausea or vomiting Anesthetic complications: no    Last Vitals:  Vitals:   04/02/16 1241 04/02/16 1321  BP: (!) 155/87 (!) 146/80  Pulse: 72 71  Resp: 16   Temp: 36.7 C 36.5 C    Last Pain:  Vitals:   04/02/16 1400  TempSrc:   PainSc: 9                  Pier Laux K Ehsan Corvin

## 2016-04-03 MED ORDER — HYDROCODONE-ACETAMINOPHEN 5-325 MG PO TABS: 1.0000 | ORAL_TABLET | ORAL | 0 refills | Status: DC | PRN

## 2016-04-03 NOTE — Progress Notes (Signed)
Discharge  Pt was able to participate with discharge teaching and receive discharge instructions without questions. PIV removed and pt able to dress independently. Pt had a pain level of 6/10 after receiving pain medication.

## 2016-04-03 NOTE — Discharge Summary (Signed)
  Patient ID: Rachael Aguilar MRN: [id number] DOB/AGE: 02-28-1966 50 y.o.  Admit date: 04/01/2016 Discharge date: 04/03/2016   Discharge Diagnoses:  Active Problems:   Acute cholecystitis   Procedures:lap chole  Hospital Course: 50 yo admitted for acute cholecystitis. Initially treated with antibiotics and had an unevenftull lap chole. She was kept overnight and did well. At DC she was ambulating, tolerating diet and her VSS. HEr exam showed a female in NAD, alert, Abd: soft, incisions c/d/i, no peritonitis or infection. Ext: well perfused w/o edema. Condition at DC is stble   Disposition: 01-Home or Self Care  Discharge Instructions    Call MD for:  difficulty breathing, headache or visual disturbances    Complete by:  As directed   Call MD for:  extreme fatigue    Complete by:  As directed   Call MD for:  hives    Complete by:  As directed   Call MD for:  persistant dizziness or light-headedness    Complete by:  As directed   Call MD for:  persistant nausea and vomiting    Complete by:  As directed   Call MD for:  redness, tenderness, or signs of infection (pain, swelling, redness, odor or green/yellow discharge around incision site)    Complete by:  As directed   Call MD for:  severe uncontrolled pain    Complete by:  As directed   Call MD for:  temperature >100.4    Complete by:  As directed   Diet - low sodium heart healthy    Complete by:  As directed   Discharge instructions    Complete by:  As directed   May shower In am, apply ice packs to incisions   Increase activity slowly    Complete by:  As directed   Lifting restrictions    Complete by:  As directed   20 lbs x 6 weeks   Other Restrictions    Complete by:  As directed   Please provide work excuse until F/U appt next week       Medication List    TAKE these medications   HYDROcodone-acetaminophen 5-325 MG tablet Commonly known as:  NORCO/VICODIN Take 1-2 tablets by mouth every 4 (four) hours as needed for moderate  pain.   lisinopril-hydrochlorothiazide 10-12.5 MG tablet Commonly known as:  PRINZIDE,ZESTORETIC Take 1 tablet by mouth daily.   ranitidine 150 MG capsule Commonly known as:  ZANTAC Take by mouth.      Follow-up Information    Jules Husbands, MD Follow up on 04/09/2016.   Specialty:  General Surgery Contact information: [place] [address] [phone]            Caroleen Hamman, MD FACS

## 2016-04-05 ENCOUNTER — Encounter: Payer: Self-pay | Admitting: Surgery

## 2016-04-05 LAB — SURGICAL PATHOLOGY

## 2016-04-09 ENCOUNTER — Encounter: Payer: Self-pay | Admitting: Surgery

## 2016-05-31 ENCOUNTER — Other Ambulatory Visit: Payer: Self-pay

## 2016-05-31 MED ORDER — LISINOPRIL-HYDROCHLOROTHIAZIDE 10-12.5 MG PO TABS: 1.0000 | ORAL_TABLET | Freq: Every day | ORAL | 0 refills | Status: DC

## 2016-05-31 MED ORDER — VENLAFAXINE HCL ER 75 MG PO CP24: 75.0000 mg | ORAL_CAPSULE | Freq: Every day | ORAL | 0 refills | Status: DC

## 2016-06-04 ENCOUNTER — Encounter: Payer: Self-pay | Admitting: Emergency Medicine

## 2016-06-04 ENCOUNTER — Emergency Department
Admission: EM | Admit: 2016-06-04 | Discharge: 2016-06-04 | Disposition: A | Discharge status: Home / Self Care | Payer: Self-pay | Attending: Emergency Medicine | Admitting: Emergency Medicine

## 2016-06-04 DIAGNOSIS — I10 Essential (primary) hypertension: Secondary | ICD-10-CM | POA: Insufficient documentation

## 2016-06-04 DIAGNOSIS — Z79899 Other long term (current) drug therapy: Secondary | ICD-10-CM | POA: Insufficient documentation

## 2016-06-04 DIAGNOSIS — K611 Rectal abscess: Secondary | ICD-10-CM | POA: Insufficient documentation

## 2016-06-04 DIAGNOSIS — Z5321 Procedure and treatment not carried out due to patient leaving prior to being seen by health care provider: Secondary | ICD-10-CM | POA: Insufficient documentation

## 2016-06-04 NOTE — ED Triage Notes (Signed)
Abscess to rectal area

## 2016-06-10 ENCOUNTER — Ambulatory Visit: Payer: Self-pay | Admitting: Family Medicine

## 2017-06-01 ENCOUNTER — Other Ambulatory Visit: Payer: Self-pay

## 2017-06-01 MED ORDER — VENLAFAXINE HCL ER 75 MG PO CP24: 75.0000 mg | ORAL_CAPSULE | Freq: Every day | ORAL | 0 refills | Status: DC

## 2020-11-09 ENCOUNTER — Inpatient Hospital Stay: Payer: Self-pay | Admitting: Anesthesiology

## 2020-11-09 ENCOUNTER — Emergency Department: Payer: Self-pay

## 2020-11-09 ENCOUNTER — Inpatient Hospital Stay
Admission: EM | Admit: 2020-11-09 | Discharge: 2020-11-11 | DRG: 377 | Disposition: A | Discharge status: Home / Self Care | Payer: Self-pay | Attending: Family Medicine | Admitting: Family Medicine

## 2020-11-09 ENCOUNTER — Encounter: Payer: Self-pay | Admitting: Emergency Medicine

## 2020-11-09 ENCOUNTER — Encounter
Admission: EM | Disposition: A | Discharge status: Home / Self Care | Payer: Self-pay | Source: Home / Self Care | Attending: Internal Medicine

## 2020-11-09 ENCOUNTER — Other Ambulatory Visit: Payer: Self-pay

## 2020-11-09 DIAGNOSIS — K254 Chronic or unspecified gastric ulcer with hemorrhage: Secondary | ICD-10-CM | POA: Diagnosis present

## 2020-11-09 DIAGNOSIS — K5790 Diverticulosis of intestine, part unspecified, without perforation or abscess without bleeding: Secondary | ICD-10-CM

## 2020-11-09 DIAGNOSIS — K2971 Gastritis, unspecified, with bleeding: Secondary | ICD-10-CM | POA: Diagnosis present

## 2020-11-09 DIAGNOSIS — I10 Essential (primary) hypertension: Secondary | ICD-10-CM | POA: Diagnosis present

## 2020-11-09 DIAGNOSIS — R55 Syncope and collapse: Secondary | ICD-10-CM | POA: Diagnosis present

## 2020-11-09 DIAGNOSIS — Z8249 Family history of ischemic heart disease and other diseases of the circulatory system: Secondary | ICD-10-CM

## 2020-11-09 DIAGNOSIS — K219 Gastro-esophageal reflux disease without esophagitis: Secondary | ICD-10-CM | POA: Diagnosis present

## 2020-11-09 DIAGNOSIS — K922 Gastrointestinal hemorrhage, unspecified: Secondary | ICD-10-CM | POA: Diagnosis present

## 2020-11-09 DIAGNOSIS — R571 Hypovolemic shock: Secondary | ICD-10-CM | POA: Diagnosis present

## 2020-11-09 DIAGNOSIS — D62 Acute posthemorrhagic anemia: Secondary | ICD-10-CM | POA: Diagnosis present

## 2020-11-09 DIAGNOSIS — Z20822 Contact with and (suspected) exposure to covid-19: Secondary | ICD-10-CM | POA: Diagnosis present

## 2020-11-09 DIAGNOSIS — F3130 Bipolar disorder, current episode depressed, mild or moderate severity, unspecified: Secondary | ICD-10-CM | POA: Diagnosis present

## 2020-11-09 DIAGNOSIS — Z79899 Other long term (current) drug therapy: Secondary | ICD-10-CM

## 2020-11-09 DIAGNOSIS — F419 Anxiety disorder, unspecified: Secondary | ICD-10-CM | POA: Diagnosis present

## 2020-11-09 DIAGNOSIS — E876 Hypokalemia: Secondary | ICD-10-CM | POA: Diagnosis present

## 2020-11-09 DIAGNOSIS — K5731 Diverticulosis of large intestine without perforation or abscess with bleeding: Principal | ICD-10-CM | POA: Diagnosis present

## 2020-11-09 DIAGNOSIS — R578 Other shock: Secondary | ICD-10-CM | POA: Diagnosis present

## 2020-11-09 HISTORY — PX: FLEXIBLE SIGMOIDOSCOPY: SHX5431

## 2020-11-09 HISTORY — PX: ESOPHAGOGASTRODUODENOSCOPY: SHX5428

## 2020-11-09 HISTORY — DX: Gastrointestinal hemorrhage, unspecified: K92.2

## 2020-11-09 LAB — COMPREHENSIVE METABOLIC PANEL
ALT: 12 U/L (ref 0–44)
AST: 17 U/L (ref 15–41)
Albumin: 3.5 g/dL (ref 3.5–5.0)
Alkaline Phosphatase: 42 U/L (ref 38–126)
Anion gap: 8 (ref 5–15)
BUN: 11 mg/dL (ref 6–20)
CO2: 23 mmol/L (ref 22–32)
Calcium: 8.4 mg/dL — ABNORMAL LOW (ref 8.9–10.3)
Chloride: 108 mmol/L (ref 98–111)
Creatinine, Ser: 0.75 mg/dL (ref 0.44–1.00)
GFR, Estimated: >60 mL/min (ref >60)
Glucose, Bld: 151 mg/dL — ABNORMAL HIGH (ref 70–99)
Potassium: 2.8 mmol/L — ABNORMAL LOW (ref 3.5–5.1)
Sodium: 139 mmol/L (ref 135–145)
Total Bilirubin: 0.7 mg/dL (ref 0.3–1.2)
Total Protein: 6 g/dL — ABNORMAL LOW (ref 6.5–8.1)

## 2020-11-09 LAB — HEMOGLOBIN AND HEMATOCRIT, BLOOD
HCT: 20.6 % — ABNORMAL LOW (ref 36.0–46.0)
HCT: 31.9 % — ABNORMAL LOW (ref 36.0–46.0)
Hemoglobin: 6.8 g/dL — ABNORMAL LOW (ref 12.0–15.0)
Hemoglobin: 10.8 g/dL — ABNORMAL LOW (ref 12.0–15.0)

## 2020-11-09 LAB — GLUCOSE, CAPILLARY: Glucose-Capillary: 106 mg/dL — ABNORMAL HIGH (ref 70–99)

## 2020-11-09 LAB — CBC
HCT: 24.5 % — ABNORMAL LOW (ref 36.0–46.0)
Hemoglobin: 8.1 g/dL — ABNORMAL LOW (ref 12.0–15.0)
MCH: 28.9 pg (ref 26.0–34.0)
MCHC: 33.1 g/dL (ref 30.0–36.0)
MCV: 87.5 fL (ref 80.0–100.0)
Platelets: 313 10*3/uL (ref 150–400)
RBC: 2.8 MIL/uL — ABNORMAL LOW (ref 3.87–5.11)
RDW: 16.8 % — ABNORMAL HIGH (ref 11.5–15.5)
WBC: 10.4 10*3/uL (ref 4.0–10.5)
nRBC: 0 % (ref 0.0–0.2)

## 2020-11-09 LAB — TYPE AND SCREEN
Unit division: 0
Unit division: 0

## 2020-11-09 LAB — MAGNESIUM: Magnesium: 1.6 mg/dL — ABNORMAL LOW (ref 1.7–2.4)

## 2020-11-09 LAB — ABO/RH: ABO/RH(D): O POS

## 2020-11-09 LAB — PROTIME-INR
INR: 1 (ref 0.8–1.2)
Prothrombin Time: 13.1 seconds (ref 11.4–15.2)

## 2020-11-09 LAB — HCG, QUANTITATIVE, PREGNANCY: hCG, Beta Chain, Quant, S: 1 m[IU]/mL (ref <5)

## 2020-11-09 LAB — RESP PANEL BY RT-PCR (FLU A&B, COVID) ARPGX2
Influenza A by PCR: NEGATIVE
Influenza B by PCR: NEGATIVE
SARS Coronavirus 2 by RT PCR: NEGATIVE

## 2020-11-09 LAB — APTT: aPTT: 26 seconds (ref 24–36)

## 2020-11-09 SURGERY — EGD (ESOPHAGOGASTRODUODENOSCOPY)
Anesthesia: General

## 2020-11-09 MED ORDER — POLYETHYLENE GLYCOL 3350 17 G PO PACK: 17.0000 g | PACK | Freq: Every day | ORAL | Status: DC | PRN

## 2020-11-09 MED ORDER — LACTATED RINGERS IV SOLN: INTRAVENOUS | Status: AC

## 2020-11-09 MED ORDER — LACTATED RINGERS IV SOLN: INTRAVENOUS | Status: DC | PRN

## 2020-11-09 MED ORDER — IOHEXOL 350 MG/ML SOLN
100.0000 mL | Freq: Once | INTRAVENOUS | Status: AC | PRN
  Administered 2020-11-09: 100 mL via INTRAVENOUS

## 2020-11-09 MED ORDER — DEXAMETHASONE SODIUM PHOSPHATE 10 MG/ML IJ SOLN
INTRAMUSCULAR | Status: DC | PRN
  Administered 2020-11-09: 10 mg via INTRAVENOUS

## 2020-11-09 MED ORDER — FENTANYL CITRATE (PF) 100 MCG/2ML IJ SOLN
INTRAMUSCULAR | Status: AC
  Filled 2020-11-09: qty 2

## 2020-11-09 MED ORDER — SUCCINYLCHOLINE CHLORIDE 20 MG/ML IJ SOLN
INTRAMUSCULAR | Status: DC | PRN
  Administered 2020-11-09: 120 mg via INTRAVENOUS

## 2020-11-09 MED ORDER — SODIUM CHLORIDE 0.9 % IV BOLUS
500.0000 mL | Freq: Once | INTRAVENOUS | Status: AC
  Administered 2020-11-09: 500 mL via INTRAVENOUS

## 2020-11-09 MED ORDER — SODIUM CHLORIDE 0.9 % IV SOLN
10.0000 mL/h | Freq: Once | INTRAVENOUS | Status: AC
  Administered 2020-11-09: 10 mL/h via INTRAVENOUS

## 2020-11-09 MED ORDER — IOHEXOL 300 MG/ML  SOLN: 100.0000 mL | Freq: Once | INTRAMUSCULAR | Status: DC | PRN

## 2020-11-09 MED ORDER — PHENYLEPHRINE HCL (PRESSORS) 10 MG/ML IV SOLN
INTRAVENOUS | Status: DC | PRN
  Administered 2020-11-09: 100 ug via INTRAVENOUS

## 2020-11-09 MED ORDER — LIDOCAINE HCL (CARDIAC) PF 100 MG/5ML IV SOSY
PREFILLED_SYRINGE | INTRAVENOUS | Status: DC | PRN
  Administered 2020-11-09: 100 mg via INTRAVENOUS

## 2020-11-09 MED ORDER — SODIUM CHLORIDE 0.9 % IV SOLN
80.0000 mg | Freq: Once | INTRAVENOUS | Status: DC
  Filled 2020-11-09: qty 80

## 2020-11-09 MED ORDER — DOCUSATE SODIUM 100 MG PO CAPS: 100.0000 mg | ORAL_CAPSULE | Freq: Two times a day (BID) | ORAL | Status: DC | PRN

## 2020-11-09 MED ORDER — PANTOPRAZOLE SODIUM 40 MG IV SOLR
40.0000 mg | Freq: Once | INTRAVENOUS | Status: AC
  Administered 2020-11-09: 40 mg via INTRAVENOUS
  Filled 2020-11-09: qty 40

## 2020-11-09 MED ORDER — VENLAFAXINE HCL ER 75 MG PO CP24
75.0000 mg | ORAL_CAPSULE | Freq: Every day | ORAL | Status: DC
  Administered 2020-11-10 – 2020-11-11 (×2): 75 mg via ORAL
  Filled 2020-11-09 (×2): qty 1

## 2020-11-09 MED ORDER — PROPOFOL 10 MG/ML IV BOLUS
INTRAVENOUS | Status: AC
  Filled 2020-11-09: qty 40

## 2020-11-09 MED ORDER — ONDANSETRON HCL 4 MG/2ML IJ SOLN
INTRAMUSCULAR | Status: DC | PRN
  Administered 2020-11-09: 4 mg via INTRAVENOUS

## 2020-11-09 MED ORDER — PANTOPRAZOLE SODIUM 40 MG IV SOLR: 40.0000 mg | Freq: Two times a day (BID) | INTRAVENOUS | Status: DC

## 2020-11-09 MED ORDER — PROPOFOL 10 MG/ML IV BOLUS
INTRAVENOUS | Status: DC | PRN
  Administered 2020-11-09: 120 mg via INTRAVENOUS

## 2020-11-09 MED ORDER — POTASSIUM CHLORIDE 10 MEQ/100ML IV SOLN
10.0000 meq | INTRAVENOUS | Status: AC
  Administered 2020-11-09: 10 meq via INTRAVENOUS
  Filled 2020-11-09: qty 100

## 2020-11-09 MED ORDER — MAGNESIUM SULFATE 2 GM/50ML IV SOLN
2.0000 g | Freq: Once | INTRAVENOUS | Status: AC
  Administered 2020-11-09: 2 g via INTRAVENOUS
  Filled 2020-11-09: qty 50

## 2020-11-09 MED ORDER — FENTANYL CITRATE (PF) 100 MCG/2ML IJ SOLN
INTRAMUSCULAR | Status: DC | PRN
  Administered 2020-11-09 (×4): 25 ug via INTRAVENOUS

## 2020-11-09 MED ORDER — ONDANSETRON HCL 4 MG/2ML IJ SOLN
4.0000 mg | Freq: Once | INTRAMUSCULAR | Status: AC
  Administered 2020-11-09: 4 mg via INTRAVENOUS
  Filled 2020-11-09: qty 2

## 2020-11-09 MED ORDER — SODIUM CHLORIDE 0.9 % IV BOLUS
1000.0000 mL | Freq: Once | INTRAVENOUS | Status: AC
  Administered 2020-11-09: 1000 mL via INTRAVENOUS

## 2020-11-09 MED ORDER — SODIUM CHLORIDE 0.9 % IV SOLN: 8.0000 mg/h | INTRAVENOUS | Status: DC

## 2020-11-09 NOTE — Op Note (Signed)
[facility]astroenterology Patient Name: Rachael Aguilar Procedure Date: 11/09/2020 8:35 PM MRN: [id number] Account #: [id number] Date of Birth: [birth year] Admit Type: Outpatient Age: 55 Room: [facility] ENDO ROOM 4 Gender: Female Note Status: Finalized Procedure:             Flexible Sigmoidoscopy Indications:           Hematochezia, Acute post hemorrhagic anemia Providers:             Benay Pike. Alice Reichert MD, MD Referring MD:          No Local Md, MD (Referring MD) Medicines:             Propofol per Anesthesia Complications:         No immediate complications. Estimated blood loss:                         Minimal. Procedure:             Pre-Anesthesia Assessment:                        - The risks and benefits of the procedure and the                         sedation options and risks were discussed with the                         patient. All questions were answered and informed                         consent was obtained.                        - Patient identification and proposed procedure were                         verified prior to the procedure by the nurse. The                         procedure was verified in the procedure room.                        - ASA Grade Assessment: III - A patient with severe                         systemic disease.                        - After reviewing the risks and benefits, the patient                         was deemed in satisfactory condition to undergo the                         procedure.                        After obtaining informed consent, the scope was passed                         under direct vision. The Colonoscope  was introduced                         through the anus and advanced to the the left                         transverse colon. The flexible sigmoidoscopy was                         somewhat difficult due to poor endoscopic                         visualization. Successful completion of the procedure                          was aided by performing the maneuvers documented                         (below) in this report. The quality of the bowel                         preparation was unprepped. Findings:      The perianal and digital rectal examinations were normal. Pertinent       negatives include normal sphincter tone and no palpable rectal lesions.      The mid transverse colon appeared normal.      There is no endoscopic evidence of bleeding, erythema, inflammation,       mass or polyps in the descending colon and in the transverse colon.      Red blood was found in the sigmoid colon. Lavage of the area was       performed using a large amount of sterile water, resulting in clearance       with adequate visualization. Estimated blood loss was minimal.      A few small-mouthed diverticula were found in the sigmoid colon.      I repeatedly irrigated all segments of the sigmoid colon that contained       blood contents, though I could identify no actively bleeding       diverticulum or mucosal abnormality. I was able to discern that no       active bleeding was noted after repeated irrigation.      Retroflexed examination in the rectum revealed no ulcerations,       significant hemorrhoids or other mucosal abnormalities. Impression:            - The mid transverse colon is normal.                        - Blood in the sigmoid colon.                        - Diverticulosis in the sigmoid colon.                        - No specimens collected.                        - The exam was suboptimal due to patient preparation.                        -  Based upon findings above, I suspect patient has a                         sigmoid colon bleed, either from vascular ectasia                         and/or sigmoid diverticulum. No active bleeding is                         noted at the time of the examination despite repeated                         'to and fro' evaluation of the sigmoid and  left colon. Recommendation:        - Observe patient in ICU until patient is stable.                        - Clear liquid diet.                        - Use Protonix (pantoprazole) 40 mg IV BID.                        - Serial H/H, Serial exams                        - Check H pylori stool Ag to evaluate pre-pyloric                         ulcer noted on EGD, also performed this evening.                        - GI service will continue to follow the patient's                         progress and closely observe for any changes in                         clinical status.                        - The findings and recommendations were discussed with                         the patient and their family. Procedure Code(s):     --- Professional ---                        [id number], Sigmoidoscopy, flexible; diagnostic, including                         collection of specimen(s) by brushing or washing, when                         performed (separate procedure) Diagnosis Code(s):     --- Professional ---                        K57.30, Diverticulosis of large intestine without  perforation or abscess without bleeding                        D62, Acute posthemorrhagic anemia                        K92.1, Melena (includes Hematochezia)                        K92.2, Gastrointestinal hemorrhage, unspecified CPT copyright 2019 [employer] Medical Association. All rights reserved. The codes documented in this report are preliminary and upon coder review may  be revised to meet current compliance requirements. [name] MD, MD 11/09/2020 9:22:07 PM This report has been signed electronically. Number of Addenda: 0 Note Initiated On: 11/09/2020 8:35 PM Total Procedure Duration: 0 hours 19 minutes 39 seconds  Estimated Blood Loss:  Estimated blood loss was minimal.      [facility]

## 2020-11-09 NOTE — ED Notes (Signed)
Patient taken to OR at this time by OR team

## 2020-11-09 NOTE — ED Notes (Signed)
Provider assisted with pelvic and rectal exam at this time. Hemoccult positive for blood in stool. Gross red blood with blood clots noted in rectum by provider.

## 2020-11-09 NOTE — ED Notes (Signed)
Pt to ct 

## 2020-11-09 NOTE — ED Notes (Signed)
First unit of blood complete.  

## 2020-11-09 NOTE — ED Triage Notes (Signed)
Pt via POV from home. Pt c/o rectal bleeding for 3 days. Blood is dark red. Pt states when she uses the restroom room it is painful in her lower abdomen. Pt does states that she is more weak and fatigues that normal. Denies NV. Denies fever. Pt is A&Ox4 but tearful during triage.

## 2020-11-09 NOTE — Consult Note (Signed)
PHARMACY CONSULT NOTE - FOLLOW UP  Pharmacy Consult for Electrolyte Monitoring and Replacement   Recent Labs: Potassium (mmol/L)  Date Value  11/09/2020 2.8 (L)  08/24/2014 3.2 (L)   Magnesium (mg/dL)  Date Value  11/09/2020 1.6 (L)   Calcium (mg/dL)  Date Value  11/09/2020 8.4 (L)   Calcium, Total (mg/dL)  Date Value  08/24/2014 8.6   Albumin (g/dL)  Date Value  11/09/2020 3.5  08/24/2014 3.6   Sodium (mmol/L)  Date Value  11/09/2020 139  12/26/2014 140  08/24/2014 137     Assessment: 55 y/o female presented with rectal bleeding x 3 days and syncopal episode. S/p 3 units pRBC in ED. Pharmacy has been consulted for electrolyte monitor and replacement as needed  Hypokalemia:  K 2.8 on admission  4 x 10 mEq KCL IV ordered   Hypocalcemia:  Ca 8.4  Monitor for hypocalcemia given transfusions   Goal of Therapy:  electrolytes WNL  Plan:   4 x 10 mEq KCL IV ordered  Recheck K+ 1 hour post last infusion (11/10/20 0000)  Re-check all other labs with AM labs   Dorothe Pea ,PharmD, BCPS Clinical Pharmacist 11/09/2020 7:23 PM

## 2020-11-09 NOTE — Anesthesia Preprocedure Evaluation (Addendum)
Anesthesia Evaluation  Patient identified by MRN, date of birth, ID band Patient awake    Reviewed: Allergy & Precautions, H&P , NPO status , Patient's Chart, lab work & pertinent test results  History of Anesthesia Complications Negative for: history of anesthetic complications  Airway Mallampati: III  TM Distance: >3 FB Neck ROM: full    Dental no notable dental hx. (+) Poor Dentition, Chipped   Pulmonary neg pulmonary ROS, neg shortness of breath,    Pulmonary exam normal breath sounds clear to auscultation       Cardiovascular Exercise Tolerance: Good hypertension, (-) angina(-) Past MI and (-) DOE Normal cardiovascular exam Rhythm:regular Rate:Normal     Neuro/Psych PSYCHIATRIC DISORDERS Anxiety Depression Bipolar Disorder negative neurological ROS     GI/Hepatic Neg liver ROS, GERD  Controlled,  Endo/Other  negative endocrine ROS  Renal/GU      Musculoskeletal   Abdominal   Peds  Hematology negative hematology ROS (+)   Anesthesia Other Findings Past Medical History: No date: Bipolar depression ([place]) No date: Hypertension  Past Surgical History: No date: DILATION AND CURETTAGE OF UTERUS  BMI    Body Mass Index:  29.13 kg/m      Reproductive/Obstetrics negative OB ROS                             Anesthesia Physical  Anesthesia Plan  ASA: III and emergent  Anesthesia Plan: General   Post-op Pain Management:    Induction: Intravenous, Rapid sequence and Cricoid pressure planned  PONV Risk Score and Plan:   Airway Management Planned: Oral ETT  Additional Equipment:   Intra-op Plan:   Post-operative Plan: Extubation in OR and Possible Post-op intubation/ventilation  Informed Consent: I have reviewed the patients History and Physical, chart, labs and discussed the procedure including the risks, benefits and alternatives for the proposed anesthesia with the patient  or authorized representative who has indicated his/her understanding and acceptance.     Dental advisory given  Plan Discussed with: Anesthesiologist, CRNA and Surgeon  Anesthesia Plan Comments: (K+ is 2.8 , but the case dictates emergent status.)       Anesthesia Quick Evaluation

## 2020-11-09 NOTE — H&P (Addendum)
NAME:  Rachael Aguilar, MRN:  [id number], DOB:  1965/08/14, LOS: 0 ADMISSION DATE:  11/09/2020, CONSULTATION DATE:  11/09/2020 REFERRING MD:  Dr. Jacqualine Code, CHIEF COMPLAINT:  Acute GI bleed  Brief History   55 y.o female presenting with acute rectal bleeding and multiple syncopal episodes.  History of present illness   55 y.o female with significant PMH as below presenting to [facility] ED today with chief complaints of rectal bleeding since Friday.  Patient report that she had 3 large bloody stools on Friday followed by a syncopal episode. She does not recall how long she was on the floor or if she hit her head. Denies associated symptoms preceding the syncope of nausea and vomiting, feeling cold or clammy, visual auras or blurry vision, palpitations shortness of breath or chest pain. She denies loss of bladder control or tongue biting.  Patient states that she went to bed and woke up the next morning with cramping abdominal pain. She did not have further episodes of rectal bleeding on Saturday however she report that when she wiped her rectum, there was blood on the tissue paper. Patient had two more episodes today which prompted her to come the ED for further evaluation. Denies use of over the counter NSAIDs, BC powder or alcohol use. She has hx of GERD but denies any recent worsening symptoms or hematemesis.   On arrival to the ED, he was afebrile with blood pressure 163/110 mm Hg and pulse rate 140 beats/min. Bp dropped to 83/50 mm Hg, RR 32. There were no focal neurological deficits; she was alert and oriented x4, and he did not demonstrate any memory deficits.  She got up to use the bathroom and had another witness syncopal episode in the ED. She was noted to have another large pool of blood from what she stated was coming from her rectum. Initial labs revealed K+ 2.8, mag 1.6, Hgb 8.1 down form baseline 13.9. CT head and CTabdomen /pelvis ordered and pending. PCCM consulted for admission.  Past Medical  History  Anxiety and Depression Hypertension Bipolar Disorder GERD Cholecystitis s/p cholecystectomy  Significant Hospital Events   11/09/2020: Admitted to PCCM service with massive GI Bleed  Consults:  General Surgery GI  Procedures:  None  Significant Diagnostic Tests:  11/09/2020: CT Abdomen and Pelvis 1. No evidence of abdominal aortic aneurysm or dissection. No focal source of active bleeding is identified. 2. Diffuse colonic wall thickening with mural edema and submucosal fat deposition. Changes may represent infectious or inflammatory colitis. Pseudomembranous colitis possible but less likely. 3. Marked enlargement of the uterus consistent with large fibroids. 4. Fluid distends the vagina.  11/09/2020: CT head> Negative for acute intracranial abnormality  Micro Data:  None  Antimicrobials:  n/a  Objective   Blood pressure (!) 145/90, pulse (!) 124, temperature 98.2 F (36.8 C), temperature source Oral, resp. rate (!) 22, height 5\' 4"  (1.626 m), weight 76.7 kg, last menstrual period 10/13/2020, SpO2 100 %.       No intake or output data in the 24 hours ending 11/09/20 1843 Filed Weights   11/09/20 1639  Weight: 76.7 kg    Physical Examination  GENERAL: 55 year-old patient lying in the bed with no acute distress.  EYES: Pupils equal, round, reactive to light and accommodation. No scleral icterus. Extraocular muscles intact.  HEENT: Head atraumatic, normocephalic. Oropharynx and nasopharynx clear.  NECK:  Supple, no jugular venous distention. No thyroid enlargement, no tenderness.  LUNGS: Normal breath sounds bilaterally, no wheezing, rales,rhonchi  or crepitation. No use of accessory muscles of respiration.  CARDIOVASCULAR: S1, S2 normal. No murmurs, rubs, or gallops.  ABDOMEN: Soft, nontender, nondistended. Bowel sounds present. No organomegaly or mass.  EXTREMITIES: No pedal edema, cyanosis, or clubbing.  NEUROLOGIC: Cranial nerves II through XII are intact.  Except speech is mildly garbled but comprehensible. Muscle strength 5/5 in all extremities. Sensation intact. Gait not checked.  PSYCHIATRIC: The patient is alert and oriented x 3.  SKIN: No obvious rash, lesion, or ulcer.   Resolved Hospital Problem list   none  Assessment & Plan:   Massive Acute Lower GI Bleed with evidence of hemodynamic instability Hemorrhagic shock Suspected causes for Lower GI: Diverticulosis, AVM, malignancy, hemorrhoids Hgb on admission 8.1 s/p 3 units PRBCs Massive Transfusion Protocol  Initiated by EDP with option for empiric FFP+Platelets if needing more than 2-4 units PRBC - Admit to ICU - At least 2x IV access, 18 gauge or larger - IVF resuscitation to maintain MAP>65 - H&H monitoring q6h -Transfuse PRN Hgb<7 - Pantoprazole 80mg  IV x1 then gtt 8mg /hr - Avoid hypothermia - NPO for pending endoscopy - GI Consult for EGD/Colonoscopy - CT angio to isolate source  - Consider vascular Consult for embolization pending CT Angio  - General surgery consult, following  - Hold NSAIDs, steroids, ASA - Helicobacter pylori Ab + stool Ag.  If either positive, treat   Syncope and Collapse? Vasovagal ? Orthostatic in the setting of massive GI bleed with hemorraghic shock - Treat acute blood loss as above - Review Electrolytes - EKG - Telemetry + Pulse Ox monitoring - Blood sugar QAC/HS - Follow H&H - CT head pending   Hypokalemia Hypomagnesium - Monitor I&O's / urinary output - Follow BMP - Replace electrolytes as indicated   Hypertension- Now hypotensive in the setting of acute blood loss - Hold BP meds, will resume once BP stabilized - IVFs  Anxiety and Depression Hx: Bipolar Disorder - Continue Venlafaxine XR   Best practice:  Diet: NPO Pain/Anxiety/Delirium protocol (if indicated): NO VAP protocol (if indicated): N/A DVT prophylaxis: Contraindicated GI prophylaxis: PPI Glucose control: SSI Mobility: Bedrest Code Status: FULL Family  Communication: Daughter at bedside Disposition: ICU  Labs   CBC: Recent Labs  Lab 11/09/20 1641 11/09/20 1813  WBC 10.4  --   HGB 8.1* 6.8*  HCT 24.5* 20.6*  MCV 87.5  --   PLT 313  --     Basic Metabolic Panel: Recent Labs  Lab 11/09/20 1641  NA 139  K 2.8*  CL 108  CO2 23  GLUCOSE 151*  BUN 11  CREATININE 0.75  CALCIUM 8.4*  MG 1.6*   GFR: Estimated Creatinine Clearance: 80.6 mL/min (by C-G formula based on SCr of 0.75 mg/dL). Recent Labs  Lab 11/09/20 1641  WBC 10.4    Liver Function Tests: Recent Labs  Lab 11/09/20 1641  AST 17  ALT 12  ALKPHOS 42  BILITOT 0.7  PROT 6.0*  ALBUMIN 3.5   No results for input(s): LIPASE, AMYLASE in the last 168 hours. No results for input(s): AMMONIA in the last 168 hours.  ABG No results found for: PHART, PCO2ART, PO2ART, HCO3, TCO2, ACIDBASEDEF, O2SAT   Coagulation Profile: Recent Labs  Lab 11/09/20 1641  INR 1.0    Cardiac Enzymes: No results for input(s): CKTOTAL, CKMB, CKMBINDEX, TROPONINI in the last 168 hours.  HbA1C: No results found for: HGBA1C  CBG: No results for input(s): GLUCAP in the last 168 hours.  Review of Systems:  Review of Systems  Constitutional: Negative for chills, diaphoresis, fever, malaise/fatigue and weight loss.  HENT: Negative.   Eyes: Negative.   Respiratory: Negative.  Negative for cough, hemoptysis and shortness of breath.   Cardiovascular: Negative.  Negative for chest pain and palpitations.  Gastrointestinal: Positive for abdominal pain and blood in stool. Negative for diarrhea, nausea and vomiting.  Genitourinary: Negative.   Musculoskeletal: Negative.   Skin: Negative.   Neurological: Positive for dizziness.  Endo/Heme/Allergies: Negative.   Psychiatric/Behavioral: Positive for depression. The patient is nervous/anxious.     Past Medical History  She,  has a past medical history of Bipolar depression ([place]) and Hypertension.   Surgical History    Past  Surgical History:  Procedure Laterality Date  . CHOLECYSTECTOMY N/A 04/02/2016   Procedure: LAPAROSCOPIC CHOLECYSTECTOMY;  Surgeon: Jules Husbands, MD;  Location: [facility] ORS;  Service: General;  Laterality: N/A;  . DILATION AND CURETTAGE OF UTERUS       Social History   reports that she has never smoked. She has never used smokeless tobacco. She reports current alcohol use of about 10.0 standard drinks of alcohol per week.   Family History   Her family history includes Hypertension in her mother; Lupus in her maternal grandmother; Ovarian cancer in her mother; Rheum arthritis in her maternal aunt; Throat cancer in her mother.   Allergies No Known Allergies   Home Medications  Prior to Admission medications   Medication Sig Start Date End Date Taking? Authorizing Provider  HYDROcodone-acetaminophen (NORCO/VICODIN) 5-325 MG tablet Take 1-2 tablets by mouth every 4 (four) hours as needed for moderate pain. 04/03/16   Pabon, Diego F, MD  lisinopril-hydrochlorothiazide (PRINZIDE,ZESTORETIC) 10-12.5 MG tablet Take 1 tablet by mouth daily. 05/31/16   [name], MD  ranitidine (ZANTAC) 150 MG capsule Take by mouth. 12/26/14   [provider]  venlafaxine XR (EFFEXOR XR) 75 MG 24 hr capsule Take 1 capsule (75 mg total) by mouth daily with breakfast. 06/01/17   [name], MD     Critical care time: 26       . [name], DNP, CCRN, FNP-C, AGACNP-BC Acute Care Nurse Practitioner  [facility] Pager: [phone] [place] at [facility]

## 2020-11-09 NOTE — Anesthesia Procedure Notes (Signed)
Procedure Name: Intubation Performed by: Hedda Slade, CRNA Pre-anesthesia Checklist: Patient identified, Patient being monitored, Timeout performed, Emergency Drugs available and Suction available Patient Re-evaluated:Patient Re-evaluated prior to induction Oxygen Delivery Method: Circle system utilized Preoxygenation: Pre-oxygenation with 100% oxygen Induction Type: IV induction, Rapid sequence and Cricoid Pressure applied Laryngoscope Size: 3 and McGraph Grade View: Grade I Tube type: Oral Tube size: 7.0 mm Number of attempts: 1 Airway Equipment and Method: Stylet Placement Confirmation: ETT inserted through vocal cords under direct vision,  positive ETCO2 and breath sounds checked- equal and bilateral Secured at: 21 cm Tube secured with: Tape Dental Injury: Teeth and Oropharynx as per pre-operative assessment  Comments: Oropharynx clear during DL

## 2020-11-09 NOTE — ED Notes (Signed)
Pt disoriented after syncopal episode, this rn unable to consent for blood. Dr Jacqualine Code ordered for emergency blood

## 2020-11-09 NOTE — ED Notes (Signed)
Dr Jacqualine Code at bedside to insert central line

## 2020-11-09 NOTE — ED Notes (Signed)
OR team at bedside to take patient for emergent procedure. Belongings given to daughter at this time. Consent not witnessed by this RN, will be completed by OR team.

## 2020-11-09 NOTE — Consult Note (Signed)
GI Inpatient Consult Note  Reason for Consult: Acute post hemorrhagic anemia, active GI bleed   Attending Requesting Consult: Dr. Delman Kitten, MD  History of Present Illness: Rachael Aguilar is a 55 y.o. female seen for evaluation of acute post hemorrhagic anemia 2/2 hematochezia at the request of ED physician - Dr. Delman Kitten. Pt has a PMH of HTN, Bipolar depression, GERD without esophagitis, and s/p cholecystectomy 2017. She presented to the EMS this afternoon for chief complaint of hematochezia. She reports symptoms started Friday night when she had BRBPR with associated syncopal episode. She denies hitting her head. She did not seek medical attention at this time. She then proceeded to spend the whole next day in the bed. No hematochezia episodes Saturday. She reports she had another painless hematochezia episode this morning with a lot of bright red blood and dark red blood in the toilet bowl. She had her cousin drop her off in the ED. Upon presentation to the ED, she was tachycardic with HR 120-140 and hypertensive 163/110 and otherwise stable vital signs. Blood work was significant for hemoglobin 8.1 with no recent hemoglobin for comparison. While in the ED around 5 PM today patient had a syncopal episode while she was on the toilet and passed a large amount of BRBPR with clots. She then became hypotensive 83/50, respirations/min 32, and was urgently transfused 3 units pRBCs and massive transfusion protocol was initiated. She received IV fluid resuscitation as well and became much more stable. Repeat hemoglobin was 6.8. Patient was then rushed for STAT CTA which showed no obvious acute GI bleeding, large uterine fibroids, and fluid in the vagina. Pelvic exam was performed which showed no bleeding. Digital rectal exam performed by myself revealed gross hematochezia with dark colored clots. GI was consulted for active GI bleeding.   Patient seen and examined in ED room tonight. Daughter is present bedside.  Patient is alert and oriented, but reports she doesn't remember her bleeding episode in the ED. She denies any previous history of GI bleeding. She denies any previous endoscopic procedures. She denies any fever, nausea, vomiting, dysphagia, indigestion, abdominal pain, or rectal pain. She denies any frequent alcohol consumption or illicit drug use. Per daughter, she has been known to use BC powders for headaches and minor aches and pains. Daughter reports her mother doesn't like to go to the hospital or seek out medical care. No recent OTC supplements or dietary changes.   Past Medical History:  Past Medical History:  Diagnosis Date  . Bipolar depression ([place])   . Hypertension     Problem List: Patient Active Problem List   Diagnosis Date Noted  . Acute GI bleeding 11/09/2020  . Acute cholecystitis 04/01/2016  . RUQ pain   . Anxiety and depression 01/14/2015  . Gastro-esophageal reflux disease without esophagitis 01/14/2015  . BP (high blood pressure) 01/14/2015  . Adiposity 01/14/2015    Past Surgical History: Past Surgical History:  Procedure Laterality Date  . CHOLECYSTECTOMY N/A 04/02/2016   Procedure: LAPAROSCOPIC CHOLECYSTECTOMY;  Surgeon: Jules Husbands, MD;  Location: [facility] ORS;  Service: General;  Laterality: N/A;  . DILATION AND CURETTAGE OF UTERUS      Allergies: No Known Allergies  Home Medications: (Not in a hospital admission)  Home medication reconciliation was completed with the patient.   Scheduled Inpatient Medications:   . [START ON 11/13/2020] pantoprazole  40 mg Intravenous Q12H    Continuous Inpatient Infusions:   . sodium chloride Stopped (11/09/20 1751)  . pantoprazole (  PROTONIX) 80 mg IVPB    . potassium chloride      PRN Inpatient Medications:  docusate sodium, polyethylene glycol  Family History: family history includes Hypertension in her mother; Lupus in her maternal grandmother; Ovarian cancer in her mother; Rheum arthritis in her maternal  aunt; Throat cancer in her mother.  The patient's family history is negative for inflammatory bowel disorders, GI malignancy, or solid organ transplantation.  Social History:   reports that she has never smoked. She has never used smokeless tobacco. She reports current alcohol use of about 10.0 standard drinks of alcohol per week. The patient denies ETOH, tobacco, or drug use.   Review of Systems: Constitutional: Weight is stable.  Eyes: No changes in vision. ENT: No oral lesions, sore throat.  GI: see HPI.  Heme/Lymph: No easy bruising.  CV: No chest pain.  GU: No hematuria.  Integumentary: No rashes.  Neuro: No headaches.  Psych: No depression/anxiety.  Endocrine: No heat/cold intolerance.  Allergic/Immunologic: No urticaria.  Resp: No cough, SOB.  Musculoskeletal: No joint swelling.    Physical Examination: BP (!) 146/88   Pulse 95   Temp 98.2 F (36.8 C) (Oral)   Resp 15   Ht 5\' 4"  (1.626 m)   Wt 76.7 kg   LMP 10/13/2020   SpO2 99%   BMI 29.01 kg/m   Ill-appearing female lying in ED stretcher. Daughter present in room and helps to answer questions. Patient is also able to answer all questions.  Gen: NAD, alert and oriented x 4 HEENT: PEERLA, EOMI, Neck: supple, no JVD or thyromegaly Chest: CTA bilaterally, no wheezes, crackles, or other adventitious sounds CV: RRR, no m/g/c/r Abd: soft, NT, ND, +BS in all four quadrants; no HSM, guarding, ridigity, or rebound tenderness Rectal: external exam without any thrombosed hemorrhoids or lesions, internal exam tender with no obvious mass palpated, gross hematochezia with dark colored clot on finger.  Ext: no edema, well perfused with 2+ pulses, Skin: no rash or lesions noted Lymph: no LAD  Data: Lab Results  Component Value Date   WBC 10.4 11/09/2020   HGB 6.8 (L) 11/09/2020   HCT 20.6 (L) 11/09/2020   MCV 87.5 11/09/2020   PLT 313 11/09/2020   Recent Labs  Lab 11/09/20 1641 11/09/20 1813  HGB 8.1* 6.8*   Lab  Results  Component Value Date   NA 139 11/09/2020   K 2.8 (L) 11/09/2020   CL 108 11/09/2020   CO2 23 11/09/2020   BUN 11 11/09/2020   CREATININE 0.75 11/09/2020   GLU 99 12/26/2014   Lab Results  Component Value Date   ALT 12 11/09/2020   AST 17 11/09/2020   ALKPHOS 42 11/09/2020   BILITOT 0.7 11/09/2020   Recent Labs  Lab 11/09/20 1641  APTT 26  INR 1.0   CTA abd/pelvis with and without contrast 11/09/2020: IMPRESSION: 1. No evidence of abdominal aortic aneurysm or dissection. No focal source of active bleeding is identified. 2. Diffuse colonic wall thickening with mural edema and submucosal fat deposition. Changes may represent infectious or inflammatory colitis. Pseudomembranous colitis possible but less likely. 3. Marked enlargement of the uterus consistent with large fibroids. 4. Fluid distends the vagina.  Assessment/Plan:  55 y/o AA female with a PMH of HTN, Bipolar depression, GERD without esophagitis, s/p cholecystectomy 2017 presented to the ED today for 3-day history of hematochezia with associated syncopal episode  1. Active GI Bleeding  2. Acute post-hemorrhagic anemia  3. Hematochezia  - Differential includes diverticular  bleeding, AVMs, colon polyps, malignancy, small bowel bleeding source, possible UGI source like PUD, gastritis, duodenitis, esophagitis, etc - Clinical presentation is consistent with active GI bleeding. No further hematochezia since 1800. Patient is much more stable s/p 3 units pRBCs and IV fluid resuscitation.  - CTA reviewed and negative for active bleeding, but did commend on diffuse colonic wall thickening with mural edema and submucosal fat deposition  Recommendations:  - 2 large bore IVs for intravenous access  - She is s/p 3 units pRBCs. Continue to monitor H&H very closely. Transfuse for hemoglobin <7.0. - Protonix bolus then gtt 8 mg/hr for gastric protection - Strictly NPO - Discussed plan of care with ED physician Dr. Jacqualine Code,  and Dr. Lysle Pearl of General Surgery - CTA reviewed and negative for active bleeding. Given her significant observed bleeding episode with syncopal episode in the ED, advise luminal evaluation urgent tonight with EGD and unprepped flexible sigmoidoscopy.  - Discussed plan of care with patient and daughter and they are in agreement - Plan for EGD and unprepped flex sig with Dr. Alice Reichert tonight - See procedure notes for findings and further recommendations  I reviewed the risks (including bleeding, perforation, infection, anesthesia complications, cardiac/respiratory complications), benefits and alternatives of EGD and unprepped flex sig with patient and daughter. Patient consents to proceed.    Thank you for the consult. Please call with questions or concerns.  Reeves Forth [facility] [phone] (Cell)

## 2020-11-09 NOTE — ED Notes (Signed)
See triage note, pt reports bright red blood with BM since Friday and one syncopal episode on Friday. Pt alert and oriented, NAD noted Call bell within reach, stretcher locked in lowest position

## 2020-11-09 NOTE — Consult Note (Signed)
Subjective:   CC: GI bleed  HPI:  Rachael Aguilar is a 55 y.o. female who was consulted by [facility] for issue above.  Symptoms were first noted several days ago. No pain, massive BRBPR with syncopal episodes.    In ED, another melena noted with syncope, tachycardic, hypotensive.  S/p 3u pRBC , rapid infused.  Fluid resuscitation in progress.  Per family, she use BC powder on a daily basis.    Past Medical History:  has a past medical history of Bipolar depression ([place]) and Hypertension.  Past Surgical History:  Past Surgical History:  Procedure Laterality Date  . CHOLECYSTECTOMY N/A 04/02/2016   Procedure: LAPAROSCOPIC CHOLECYSTECTOMY;  Surgeon: Jules Husbands, MD;  Location: [facility] ORS;  Service: General;  Laterality: N/A;  . DILATION AND CURETTAGE OF UTERUS      Family History: family history includes Hypertension in her mother; Lupus in her maternal grandmother; Ovarian cancer in her mother; Rheum arthritis in her maternal aunt; Throat cancer in her mother.  Social History:  reports that she has never smoked. She has never used smokeless tobacco. She reports current alcohol use of about 10.0 standard drinks of alcohol per week. No history on file for drug use.  Current Medications:  Prior to Admission medications   Medication Sig Start Date End Date Taking? Authorizing Provider  HYDROcodone-acetaminophen (NORCO/VICODIN) 5-325 MG tablet Take 1-2 tablets by mouth every 4 (four) hours as needed for moderate pain. 04/03/16   Pabon, Diego F, MD  lisinopril-hydrochlorothiazide (PRINZIDE,ZESTORETIC) 10-12.5 MG tablet Take 1 tablet by mouth daily. 05/31/16   [name], MD  ranitidine (ZANTAC) 150 MG capsule Take by mouth. 12/26/14   [provider]  venlafaxine XR (EFFEXOR XR) 75 MG 24 hr capsule Take 1 capsule (75 mg total) by mouth daily with breakfast. 06/01/17   [name], MD    Allergies:  Allergies as of 11/09/2020  . (No Known Allergies)    ROS:   General: Denies weight loss, weight gain, fatigue, fevers, chills, and night sweats. Eyes: Denies blurry vision, double vision, eye pain, itchy eyes, and tearing. Ears: Denies hearing loss, earache, and ringing in ears. Nose: Denies sinus pain, congestion, infections, runny nose, and nosebleeds. Mouth/throat: Denies hoarseness, sore throat, bleeding gums, and difficulty swallowing. Heart: Denies chest pain, palpitations, racing heart, irregular heartbeat, leg pain or swelling, and decreased activity tolerance. Respiratory: Denies breathing difficulty, shortness of breath, wheezing, cough, and sputum. GI: Denies change in appetite, heartburn, nausea, vomiting, constipation, diarrhea, and blood in stool. GU: Denies difficulty urinating, pain with urinating, urgency, frequency, blood in urine. Musculoskeletal: Denies joint stiffness, pain, swelling, muscle weakness. Skin: Denies rash, itching, mass, tumors, sores, and boils Neurologic: Denies headache, fainting, dizziness, seizures, numbness, and tingling. Psychiatric: Denies depression, anxiety, difficulty sleeping, and memory loss. Endocrine: Denies heat or cold intolerance, and increased thirst or urination. Blood/lymph: Denies easy bruising, easy bruising, and swollen glands     Objective:     BP (!) 145/90   Pulse (!) 124   Temp 98.2 F (36.8 C) (Oral)   Resp (!) 22   Ht 5\' 4"  (1.626 m)   Wt 76.7 kg   LMP 10/13/2020   SpO2 100%   BMI 29.01 kg/m   Constitutional :  alert, cooperative, appears stated age and no distress  Lymphatics/Throat:  no asymmetry, masses, or scars  Respiratory:  clear to auscultation bilaterally  Cardiovascular:  tachy rate  Gastrointestinal: soft, non-tender; bowel sounds normal; no masses,  no organomegaly.   Musculoskeletal: Steady movement  Skin: Cool and moist  Psychiatric: Normal affect, non-agitated, not confused  Rectal: Gross blood on exam.    LABS:  CMP Latest Ref Rng & Units 11/09/2020  04/01/2016 03/31/2016  Glucose 70 - 99 mg/dL 151(H) 90 95  BUN 6 - 20 mg/dL 11 12 10   Creatinine 0.44 - 1.00 mg/dL 0.75 0.85 0.99  Sodium 135 - 145 mmol/L 139 141 139  Potassium 3.5 - 5.1 mmol/L 2.8(L) 3.3(L) 3.4(L)  Chloride 98 - 111 mmol/L 108 104 104  CO2 22 - 32 mmol/L 23 31 28   Calcium 8.9 - 10.3 mg/dL 8.4(L) 9.1 9.2  Total Protein 6.5 - 8.1 g/dL 6.0(L) 7.4 7.4  Total Bilirubin 0.3 - 1.2 mg/dL 0.7 0.9 0.9  Alkaline Phos 38 - 126 U/L 42 55 57  AST 15 - 41 U/L 17 20 22   ALT 0 - 44 U/L 12 18 18    CBC Latest Ref Rng & Units 11/09/2020 04/01/2016 03/31/2016  WBC 4.0 - 10.5 K/uL 10.4 7.5 7.6  Hemoglobin 12.0 - 15.0 g/dL 8.1(L) 13.9 14.2  Hematocrit 36.0 - 46.0 % 24.5(L) 40.9 40.0  Platelets 150 - 400 K/uL 313 306 334    RADS: CLINICAL DATA:  Rectal bleeding for 3 days with dark red blood. Lower abdominal pain. Weakness and fatigue.  EXAM: CTA ABDOMEN AND PELVIS WITHOUT AND WITH CONTRAST  TECHNIQUE: Multidetector CT imaging of the abdomen and pelvis was performed using the standard protocol during bolus administration of intravenous contrast. Multiplanar reconstructed images and MIPs were obtained and reviewed to evaluate the vascular anatomy.  CONTRAST:  121mL OMNIPAQUE IOHEXOL 350 MG/ML SOLN  COMPARISON:  None.  FINDINGS: VASCULAR  Aorta: Unenhanced images demonstrate minimal calcification in the aorta. Calcified phleboliths in the pelvis. Arterial phase contrast-enhanced images demonstrate normal caliber abdominal aorta with full patency. No significant stenosis or occlusion. No aneurysm or dissection.  Celiac: Patent without evidence of aneurysm, dissection, vasculitis or significant stenosis.  SMA: Patent without evidence of aneurysm, dissection, vasculitis or significant stenosis.  Renals: Both renal arteries are patent without evidence of aneurysm, dissection, vasculitis, fibromuscular dysplasia or significant stenosis.  IMA: Patent without evidence  of aneurysm, dissection, vasculitis or significant stenosis.  Inflow: Patent without evidence of aneurysm, dissection, vasculitis or significant stenosis.  Proximal Outflow: Bilateral common femoral and visualized portions of the superficial and profunda femoral arteries are patent without evidence of aneurysm, dissection, vasculitis or significant stenosis.  Veins: No obvious venous abnormality within the limitations of this arterial phase study.  Review of the MIP images confirms the above findings.  NON-VASCULAR  Lower chest: Lung bases are clear.  Hepatobiliary: No focal liver abnormality is seen. Status post cholecystectomy. No biliary dilatation.  Pancreas: Unremarkable. No pancreatic ductal dilatation or surrounding inflammatory changes.  Spleen: Normal in size without focal abnormality.  Adrenals/Urinary Tract: Adrenal glands are unremarkable. Kidneys are normal, without renal calculi, focal lesion, or hydronephrosis. Bladder is unremarkable.  Stomach/Bowel: Stomach, small bowel, and colon are not abnormally distended. Decompression limits evaluation but there appears to be wall thickening throughout the colon with some mural edema and submucosal fat deposition. No significant pericolonic stranding. Changes may represent infectious or inflammatory colitis. Pseudomembranous colitis possible but less likely. No focal intraluminal contrast pooling is identified. No focal source of active bleeding is identified.  Lymphatic: No significant lymphadenopathy.  Reproductive: Marked enlargement of the uterus consistent with large fibroids, measuring up to 9.5 x 12.1 cm. Fluid distends the vagina. No adnexal  masses are identified.  Other: No free air or free fluid in the abdomen. Abdominal wall musculature appears intact.  Musculoskeletal: No acute or significant osseous findings.  IMPRESSION: 1. No evidence of abdominal aortic aneurysm or dissection.  No focal source of active bleeding is identified. 2. Diffuse colonic wall thickening with mural edema and submucosal fat deposition. Changes may represent infectious or inflammatory colitis. Pseudomembranous colitis possible but less likely. 3. Marked enlargement of the uterus consistent with large fibroids. 4. Fluid distends the vagina.   Electronically Signed   By: Lucienne Capers M.D.   On: 11/09/2020 19:21  Assessment:   Massive lower GI bleed.    Plan:   No further episodes since initial notification at 1806.  CTA as noted above.  GI onboard, so will defer next steps to them for now.  Supportive care per ICU team.  Surgery will be on standby as needed.

## 2020-11-09 NOTE — ED Notes (Signed)
Per quale stop all fluids

## 2020-11-09 NOTE — ED Notes (Signed)
Pelvic exam setup at bedside at this time

## 2020-11-09 NOTE — ED Notes (Addendum)
Pt assisted to toilet, alert and oriented, clear speech. Pt refused to use bed pan, states she is able to use toilet in room. Pt steady gait to toilet, alert and oriented. RN in room while toileting, pt fell off toilet, had syncopal episode. Large amount of red blood to toilet, pt passing large amount of red blood after fall while assisting pt to stretcher. Pt noted to vomit x1.  Dr Jacqualine Code at bedside, Charge RN at bedside Loss of 1 iv access. Levada Dy RN in room to assist with access.  NT to bloodbank to retrieve emergency blood per Dr Jacqualine Code.  Daughter at bedside

## 2020-11-09 NOTE — ED Notes (Signed)
3rd unit of blood complete with rapid infuser

## 2020-11-09 NOTE — Op Note (Signed)
[facility] Gastroenterology Patient Name: Rachael Aguilar Procedure Date: 11/09/2020 8:36 PM MRN: [id number] Account #: [id number] Date of Birth: June 24, 1966 Admit Type: Inpatient Age: 55 Room: [facility] ENDO ROOM 4 Gender: Female Note Status: Finalized Procedure:             Upper GI endoscopy Indications:           Acute post hemorrhagic anemia, Hematochezia Providers:             Benay Pike. Alice Reichert MD, MD Referring MD:          No Local Md, MD (Referring MD) Medicines:             Propofol per Anesthesia Complications:         No immediate complications. Procedure:             Pre-Anesthesia Assessment:                        - The risks and benefits of the procedure and the                         sedation options and risks were discussed with the                         patient. All questions were answered and informed                         consent was obtained.                        - Patient identification and proposed procedure were                         verified prior to the procedure by the nurse. The                         procedure was verified in the procedure room.                        - ASA Grade Assessment: III - A patient with severe                         systemic disease.                        - After reviewing the risks and benefits, the patient                         was deemed in satisfactory condition to undergo the                         procedure.                        After obtaining informed consent, the endoscope was                         passed under direct vision. Throughout the procedure,                         the patient's  blood pressure, pulse, and oxygen                         saturations were monitored continuously. The Endoscope                         was introduced through the mouth, and advanced to the                         third part of duodenum. The upper GI endoscopy was                         accomplished  without difficulty. The patient tolerated                         the procedure well. Findings:      The esophagus was normal.      One non-bleeding cratered gastric ulcer with no stigmata of bleeding was       found in the prepyloric region of the stomach. The lesion was 8 mm in       largest dimension.      Diffuse minimal inflammation characterized by erythema was found in the       entire examined stomach.      The examined duodenum was normal.      The exam was otherwise without abnormality. Impression:            - Normal esophagus.                        - Non-bleeding gastric ulcer with no stigmata of                         bleeding.                        - Gastritis.                        - Normal examined duodenum.                        - The examination was otherwise normal.                        - No specimens collected. Recommendation:        - Proceed with colonoscopy Procedure Code(s):     --- Professional ---                        [phone], Esophagogastroduodenoscopy, flexible,                         transoral; diagnostic, including collection of                         specimen(s) by brushing or washing, when performed                         (separate procedure) Diagnosis Code(s):     --- Professional ---  K92.1, Melena (includes Hematochezia)                        D62, Acute posthemorrhagic anemia                        K29.70, Gastritis, unspecified, without bleeding                        K25.9, Gastric ulcer, unspecified as acute or chronic,                         without hemorrhage or perforation CPT copyright 2019 [employer]. All rights reserved. The codes documented in this report are preliminary and upon coder review may  be revised to meet current compliance requirements. [name] MD, MD 11/09/2020 8:46:38 PM This report has been signed electronically. Number of Addenda: 0 Note Initiated On: 11/09/2020  8:36 PM Estimated Blood Loss:  Estimated blood loss: none.      [facility]

## 2020-11-09 NOTE — Transfer of Care (Signed)
Immediate Anesthesia Transfer of Care Note  Patient: Rachael Aguilar  Procedure(s) Performed: ESOPHAGOGASTRODUODENOSCOPY (EGD) (N/A ) FLEXIBLE SIGMOIDOSCOPY (N/A )  Patient Location: PACU  Anesthesia Type:General  Level of Consciousness: sedated  Airway & Oxygen Therapy: Patient Spontanous Breathing and Patient connected to face mask oxygen  Post-op Assessment: Report given to RN and Post -op Vital signs reviewed and stable  Post vital signs: Reviewed and stable  Last Vitals:  Vitals Value Taken Time  BP 130/83 11/09/20 2123  Temp    Pulse 98 11/09/20 2123  Resp 33 11/09/20 2123  SpO2 100 % 11/09/20 2123  Vitals shown include unvalidated device data.  Last Pain:  Vitals:   11/09/20 1639  TempSrc:   PainSc: 0-No pain         Complications: No complications documented.

## 2020-11-09 NOTE — ED Notes (Signed)
Pt vomiting after fall, pt moaning Rachael Aguilar aware

## 2020-11-09 NOTE — ED Notes (Signed)
Hospitalist at bedside at this time 

## 2020-11-09 NOTE — ED Notes (Signed)
High fall risk bracelet on pt, stretcher locked in lowest position with call bell in reach

## 2020-11-09 NOTE — ED Provider Notes (Signed)
[facility] Emergency Department Provider Note ____________________________________________   Event Date/Time   First MD Initiated Contact with Patient 11/09/20 1712     (approximate)  I have reviewed the triage vital signs and the nursing notes.   HISTORY  Chief Complaint Rectal Bleeding  HPI Rachael Aguilar is a 55 y.o. female here for evaluation of bleeding per rectum  Patient reports she got up Friday had a bowel movement and then immediately after felt like she passed out briefly.  No injury.  She noticed a lot of blood in the toilet.  She did not have another bowel movement, but felt weak and just spent the whole day in bed.  Then she told her family about the encouraged her she probably need to come to the hospital, and then again this morning she had another bowel movement that she reports was very bloody she took a picture of it actually shows me demonstrates obvious blood in the toilet with what appears to be blood-tinged or dark stool though it is little hard to tell if this is actually melena or hematochezia from the photo  She denies being in any pain or discomfort at this time.  It was painful when she had the large bowel movement but no ongoing pain no fevers no chills.  Denies pregnancy no vaginal bleeding.  She is never had any bleeding problems in the past takes no medications has no known bleeding disorder  Previous cholecystectomy   Past Medical History:  Diagnosis Date  . Bipolar depression ([place])   . Hypertension     Patient Active Problem List   Diagnosis Date Noted  . Acute GI bleeding 11/09/2020  . Acute cholecystitis 04/01/2016  . RUQ pain   . Anxiety and depression 01/14/2015  . Gastro-esophageal reflux disease without esophagitis 01/14/2015  . BP (high blood pressure) 01/14/2015  . Adiposity 01/14/2015    Past Surgical History:  Procedure Laterality Date  . CHOLECYSTECTOMY N/A 04/02/2016   Procedure: LAPAROSCOPIC  CHOLECYSTECTOMY;  Surgeon: Jules Husbands, MD;  Location: [facility] ORS;  Service: General;  Laterality: N/A;  . DILATION AND CURETTAGE OF UTERUS      Prior to Admission medications   Medication Sig Start Date End Date Taking? Authorizing Provider  HYDROcodone-acetaminophen (NORCO/VICODIN) 5-325 MG tablet Take 1-2 tablets by mouth every 4 (four) hours as needed for moderate pain. 04/03/16   Pabon, Diego F, MD  lisinopril-hydrochlorothiazide (PRINZIDE,ZESTORETIC) 10-12.5 MG tablet Take 1 tablet by mouth daily. 05/31/16   [name], MD  ranitidine (ZANTAC) 150 MG capsule Take by mouth. 12/26/14   [provider]  venlafaxine XR (EFFEXOR XR) 75 MG 24 hr capsule Take 1 capsule (75 mg total) by mouth daily with breakfast. 06/01/17   [name], MD    Allergies Patient has no known allergies.  Family History  Problem Relation Age of Onset  . Ovarian cancer Mother   . Throat cancer Mother   . Hypertension Mother   . Lupus Maternal Grandmother   . Rheum arthritis Maternal Aunt     Social History Social History   Tobacco Use  . Smoking status: Never Smoker  . Smokeless tobacco: Never Used  Substance Use Topics  . Alcohol use: Yes    Alcohol/week: 10.0 standard drinks    Types: 10 Shots of liquor per week    Review of Systems Constitutional: No fever/chills Eyes: No visual changes. Cardiovascular: Denies chest pain. Respiratory: Denies shortness of breath. Gastrointestinal: No  abdominal pain.  See HPI, only time she experienced pain is when she is having the bloody bowel movement Genitourinary: Negative for dysuria. Musculoskeletal: Negative for back pain. Skin: Negative for rash. Neurological: Negative for headaches, areas of focal weakness or numbness.    ____________________________________________   PHYSICAL EXAM:  VITAL SIGNS: ED Triage Vitals  Enc Vitals Group     BP 11/09/20 1638 (S) (!) 163/110     Pulse Rate 11/09/20 1638 (!) 140      Resp 11/09/20 1638 20     Temp 11/09/20 1638 98.2 F (36.8 C)     Temp Source 11/09/20 1638 Oral     SpO2 11/09/20 1638 100 %     Weight 11/09/20 1639 169 lb (76.7 kg)     Height 11/09/20 1639 5\' 4"  (1.626 m)     Head Circumference --      Peak Flow --      Pain Score 11/09/20 1639 0     Pain Loc --      Pain Edu? --      Excl. in [place]? --     Constitutional: Alert and oriented. Well appearing and in no acute distress. Eyes: Conjunctivae are normal. Head: Atraumatic. Nose: No congestion/rhinnorhea. Mouth/Throat: Mucous membranes are moist. Neck: No stridor.  Cardiovascular: Tachycardic rate, regular rhythm. Grossly normal heart sounds.  Good peripheral circulation. Respiratory: Normal respiratory effort.  No retractions. Lungs CTAB. Gastrointestinal: Soft and nontender. No distention.  Denies pain to palpation in any quadrant.  No rebound or guarding.  Previous old cholecystectomy scars Musculoskeletal: No lower extremity tenderness nor edema. Neurologic:  Normal speech and language. No gross focal neurologic deficits are appreciated.  Skin:  Skin is warm, dry and intact. No rash noted. Psychiatric: Mood and affect are normal. Speech and behavior are normal.  ____________________________________________   LABS (all labs ordered are listed, but only abnormal results are displayed)  Labs Reviewed  COMPREHENSIVE METABOLIC PANEL - Abnormal; Notable for the following components:      Result Value   Potassium 2.8 (*)    Glucose, Bld 151 (*)    Calcium 8.4 (*)    Total Protein 6.0 (*)    All other components within normal limits  CBC - Abnormal; Notable for the following components:   RBC 2.80 (*)    Hemoglobin 8.1 (*)    HCT 24.5 (*)    RDW 16.8 (*)    All other components within normal limits  MAGNESIUM - Abnormal; Notable for the following components:   Magnesium 1.6 (*)    All other components within normal limits  HEMOGLOBIN AND HEMATOCRIT, BLOOD - Abnormal; Notable for  the following components:   Hemoglobin 6.8 (*)    HCT 20.6 (*)    All other components within normal limits  RESP PANEL BY RT-PCR (FLU A&B, COVID) ARPGX2  HCG, QUANTITATIVE, PREGNANCY  PROTIME-INR  APTT  DIC (DISSEMINATED INTRAVASCULAR COAGULATION)PANEL  DIC (DISSEMINATED INTRAVASCULAR COAGULATION)PANEL  DIC (DISSEMINATED INTRAVASCULAR COAGULATION)PANEL  DIC (DISSEMINATED INTRAVASCULAR COAGULATION)PANEL  DIC (DISSEMINATED INTRAVASCULAR COAGULATION)PANEL  HEMOGLOBIN AND HEMATOCRIT, BLOOD  HEMOGLOBIN AND HEMATOCRIT, BLOOD  HEMOGLOBIN AND HEMATOCRIT, BLOOD  HEMOGLOBIN AND HEMATOCRIT, BLOOD  HIV ANTIBODY (ROUTINE TESTING W REFLEX)  BLOOD GAS, ARTERIAL  CBC  BASIC METABOLIC PANEL  MAGNESIUM  POC OCCULT BLOOD, ED  POC URINE PREG, ED  TYPE AND SCREEN  PREPARE RBC (CROSSMATCH)  ABO/RH  MASSIVE TRANSFUSION PROTOCOL ORDER (BLOOD BANK NOTIFICATION)   ____________________________________________  EKG  Reviewed interpreted at 1645 Heart rate  135 QRS 70 QTc 440 Sinus tachycardia ____________________________________________  RADIOLOGY  CT Head Wo Contrast  Result Date: 11/09/2020 CLINICAL DATA:  Head trauma.  Syncope. EXAM: CT HEAD WITHOUT CONTRAST TECHNIQUE: Contiguous axial images were obtained from the base of the skull through the vertex without intravenous contrast. COMPARISON:  None. FINDINGS: Brain: No evidence of acute large vascular territory infarction, hemorrhage, hydrocephalus, extra-axial collection or mass lesion/mass effect. Age advanced patchy white matter hypoattenuation, slightly progressed from prior. Vascular: No hyperdense vessel. Skull: No acute fracture. Sinuses/Orbits: Left sphenoid sinus mucosal thickening. Other: No mastoid effusions. IMPRESSION: 1. No evidence of acute intracranial abnormality. 2. Chronic age-advanced patchy white matter hypoattenuation, most likely secondary to chronic microvascular ischemic disease but nonspecific. An MRI could further  characterize if clinically indicated. 3. Left sphenoid sinus mucosal thickening Electronically Signed   By: [name] MD   On: 11/09/2020 18:56   CT Angio Abd/Pel W and/or Wo Contrast  Result Date: 11/09/2020 CLINICAL DATA:  Rectal bleeding for 3 days with dark red blood. Lower abdominal pain. Weakness and fatigue. EXAM: CTA ABDOMEN AND PELVIS WITHOUT AND WITH CONTRAST TECHNIQUE: Multidetector CT imaging of the abdomen and pelvis was performed using the standard protocol during bolus administration of intravenous contrast. Multiplanar reconstructed images and MIPs were obtained and reviewed to evaluate the vascular anatomy. CONTRAST:  140mL OMNIPAQUE IOHEXOL 350 MG/ML SOLN COMPARISON:  None. FINDINGS: VASCULAR Aorta: Unenhanced images demonstrate minimal calcification in the aorta. Calcified phleboliths in the pelvis. Arterial phase contrast-enhanced images demonstrate normal caliber abdominal aorta with full patency. No significant stenosis or occlusion. No aneurysm or dissection. Celiac: Patent without evidence of aneurysm, dissection, vasculitis or significant stenosis. SMA: Patent without evidence of aneurysm, dissection, vasculitis or significant stenosis. Renals: Both renal arteries are patent without evidence of aneurysm, dissection, vasculitis, fibromuscular dysplasia or significant stenosis. IMA: Patent without evidence of aneurysm, dissection, vasculitis or significant stenosis. Inflow: Patent without evidence of aneurysm, dissection, vasculitis or significant stenosis. Proximal Outflow: Bilateral common femoral and visualized portions of the superficial and profunda femoral arteries are patent without evidence of aneurysm, dissection, vasculitis or significant stenosis. Veins: No obvious venous abnormality within the limitations of this arterial phase study. Review of the MIP images confirms the above findings. NON-VASCULAR Lower chest: Lung bases are clear. Hepatobiliary: No focal liver  abnormality is seen. Status post cholecystectomy. No biliary dilatation. Pancreas: Unremarkable. No pancreatic ductal dilatation or surrounding inflammatory changes. Spleen: Normal in size without focal abnormality. Adrenals/Urinary Tract: Adrenal glands are unremarkable. Kidneys are normal, without renal calculi, focal lesion, or hydronephrosis. Bladder is unremarkable. Stomach/Bowel: Stomach, small bowel, and colon are not abnormally distended. Decompression limits evaluation but there appears to be wall thickening throughout the colon with some mural edema and submucosal fat deposition. No significant pericolonic stranding. Changes may represent infectious or inflammatory colitis. Pseudomembranous colitis possible but less likely. No focal intraluminal contrast pooling is identified. No focal source of active bleeding is identified. Lymphatic: No significant lymphadenopathy. Reproductive: Marked enlargement of the uterus consistent with large fibroids, measuring up to 9.5 x 12.1 cm. Fluid distends the vagina. No adnexal masses are identified. Other: No free air or free fluid in the abdomen. Abdominal wall musculature appears intact. Musculoskeletal: No acute or significant osseous findings. IMPRESSION: 1. No evidence of abdominal aortic aneurysm or dissection. No focal source of active bleeding is identified. 2. Diffuse colonic wall thickening with mural edema and submucosal fat deposition. Changes may represent infectious or inflammatory colitis. Pseudomembranous colitis possible but less likely. 3.  Marked enlargement of the uterus consistent with large fibroids. 4. Fluid distends the vagina. Electronically Signed   By: Lucienne Capers M.D.   On: 11/09/2020 19:21    CT imaging reviewed, discussed directly with Dr. Lysle Pearl as well as GI team.  Personally viewed by me as well.  Findings as above  CT of the head negative for acute intracranial  hemorrhage ____________________________________________   PROCEDURES  Procedure(s) performed: angiocath  Angiocath insertion Performed by: Delman Kitten  Consent: Verbal consent obtained. Risks and benefits: risks, benefits and alternatives were discussed Time out: Immediately prior to procedure a "time out" was called to verify the correct patient, procedure, equipment, support staff and site/side marked as required.  Preparation: Patient was prepped and draped in the usual sterile fashion.  Vein Location: Right antecubital  Ultrasound Guided  Gauge: 18  Normal blood return and flush without difficulty Patient tolerance: Patient tolerated the procedure well with no immediate complications.     Procedures  Critical Care performed: Yes, see critical care note(s)  CRITICAL CARE Performed by: Delman Kitten   Total critical care time: 60 minutes  Critical care time was exclusive of separately billable procedures and treating other patients.  Critical care was necessary to treat or prevent imminent or life-threatening deterioration.  Critical care was time spent personally by me on the following activities: development of treatment plan with patient and/or surrogate as well as nursing, discussions with consultants, evaluation of patient's response to treatment, examination of patient, obtaining history from patient or surrogate, ordering and performing treatments and interventions, ordering and review of laboratory studies, ordering and review of radiographic studies, pulse oximetry and re-evaluation of patient's condition.  CRITICAL CARE  Added critical care time due to acuity, patient with hemorrhagic shock, requiring significant bedside time and direction of care including consultation with GI and surgery development of plan, consultation with ICU team.  Performed by: Delman Kitten   Total critical care time: 35 minutes  Critical care time was exclusive of separately  billable procedures and treating other patients.  Critical care was necessary to treat or prevent imminent or life-threatening deterioration.  Critical care was time spent personally by me on the following activities: development of treatment plan with patient and/or surrogate as well as nursing, discussions with consultants, evaluation of patient's response to treatment, examination of patient, obtaining history from patient or surrogate, ordering and performing treatments and interventions, ordering and review of laboratory studies, ordering and review of radiographic studies, pulse oximetry and re-evaluation of patient's condition.  ____________________________________________   INITIAL IMPRESSION / ASSESSMENT AND PLAN / ED COURSE  Pertinent labs & imaging results that were available during my care of the patient were reviewed by me and considered in my medical decision making (see chart for details).   Patient presents for what appears to be obvious rectal bleeding, appears to be primarily painless in nature.  Differential diagnosis remains broad, however seems to focus likely on lower GI bleeding possibly of diverticular AVM or tumor type cause.  Multiple other etiology of course considered.  The patient is quite tachycardic even at rest had a syncopal episode on Friday.  Thankfully she is come to the ER for further evaluation today.  I discussed with her risks and benefits of blood transfusion, strongly recommend we initiate blood transfusion.  Additionally have discussed the case and care with Dr. Alice Reichert of our GI physician, and he advises to obtain a tagged red blood cell study which I think is quite appropriate at this  time and have ordered.  Clinical Course as of 11/09/20 1936  Nancy Fetter Nov 09, 2020  1721 Patient is tachycardic, discussed risks and benefits of blood transfusion and recommended initiation of blood transfusion to the patient.  She understands this and is leaning towards getting  a blood transfusion but wishes to talk to her aunt who is here supporting her and currently in the lobby [MQ]  97 Dr. Lysle Pearl consulted, stat. Coming to assist now [MQ]  1829 Case discussed and accepted for admission to critical care medicine service Dr. Mortimer Fries [MQ]  [phone] Patient alert, escorted by nurse.  Speculum exam performed to evaluate for gross blood, no vaginal bleeding.  GI team performed rectal examination, advises clots present on rectal exam.  The source of bleeding at this point strongly suggested to be GI in nature.  Patient admitted to ICU [MQ]    Clinical Course User Index [MQ] Delman Kitten, MD   Patient denies use of any anticoagulants.  No upper abdominal pain no vomiting.  Does not appear to be an obvious upper GI type of issue.  No fever and no leukocytosis arguing against acute infectious etiology.  Additionally the patient's potassium is slightly low at 2.8 which we will replete.  Patient as well as her daughter who is at the bedside both acknowledged and verbally consented to receiving blood product after the patient had a syncopal episode associated with ongoing significant GI bleeding.  Nursing attending to the patient, patient had a syncopal episode became significantly hypotensive and required emergent blood product release for what appears to be obvious and apparent life-threatening hemorrhagic shock from suspected GI blood loss.  Patient denies any vaginal bleeding.  GI team including physician assistant Cornelia Copa, as well as general surgery Dr. Lysle Pearl arrived to the ER promptly to assist with care and resuscitation.  ----------------------------------------- 6:38 PM on 11/09/2020 -----------------------------------------  ICU team, NP Benjamine Mola, now at [place] for admit and ongoing care management.  ----------------------------------------- 7:37 PM on 11/09/2020 -----------------------------------------  Patient is alert and oriented.  Much improved from her condition  previous.  Speculum exam performed with nurse.  Patient denies being in any pain or discomfort at this time.  Resting comfortably without distress.  Hemodynamics much improved.  Stable for admission to the intensive care unit for further care under the ICU team as well as consults to GI and surgery were currently in the ER continuing their evaluation of the patient ____________________________________________   FINAL CLINICAL IMPRESSION(S) / ED DIAGNOSES  Final diagnoses:  Gastrointestinal hemorrhage, unspecified gastrointestinal hemorrhage type  Acute blood loss anemia  Hypokalemia        Note:  This document was prepared using Dragon voice recognition software and may include unintentional dictation errors       Delman Kitten, MD 11/09/20 [phone]

## 2020-11-09 NOTE — ED Notes (Signed)
Warm fluids started

## 2020-11-10 ENCOUNTER — Encounter: Payer: Self-pay | Admitting: Internal Medicine

## 2020-11-10 DIAGNOSIS — K810 Acute cholecystitis: Secondary | ICD-10-CM

## 2020-11-10 DIAGNOSIS — K922 Gastrointestinal hemorrhage, unspecified: Secondary | ICD-10-CM

## 2020-11-10 DIAGNOSIS — K219 Gastro-esophageal reflux disease without esophagitis: Secondary | ICD-10-CM

## 2020-11-10 DIAGNOSIS — D62 Acute posthemorrhagic anemia: Secondary | ICD-10-CM

## 2020-11-10 LAB — BASIC METABOLIC PANEL
Anion gap: 7 (ref 5–15)
BUN: 7 mg/dL (ref 6–20)
CO2: 20 mmol/L — ABNORMAL LOW (ref 22–32)
Calcium: 7.8 mg/dL — ABNORMAL LOW (ref 8.9–10.3)
Chloride: 111 mmol/L (ref 98–111)
Creatinine, Ser: 0.72 mg/dL (ref 0.44–1.00)
GFR, Estimated: >60 mL/min (ref >60)
Glucose, Bld: 122 mg/dL — ABNORMAL HIGH (ref 70–99)
Potassium: 3.7 mmol/L (ref 3.5–5.1)
Sodium: 138 mmol/L (ref 135–145)

## 2020-11-10 LAB — BPAM FFP
Blood Product Expiration Date: 202204082359
Blood Product Expiration Date: 202204082359
Blood Product Expiration Date: 202204082359
Blood Product Expiration Date: 202204082359
Unit Type and Rh: 5100
Unit Type and Rh: 5100
Unit Type and Rh: 5100
Unit Type and Rh: 5100

## 2020-11-10 LAB — DIC (DISSEMINATED INTRAVASCULAR COAGULATION)PANEL
D-Dimer, Quant: 0.34 ug/mL-FEU (ref 0.00–0.50)
D-Dimer, Quant: 0.35 ug/mL-FEU (ref 0.00–0.50)
Fibrinogen: 209 mg/dL — ABNORMAL LOW (ref 210–475)
Fibrinogen: 250 mg/dL (ref 210–475)
INR: 1.2 (ref 0.8–1.2)
INR: 1.1 (ref 0.8–1.2)
Platelets: 239 10*3/uL (ref 150–400)
Platelets: 179 10*3/uL (ref 150–400)
Prothrombin Time: 15.1 seconds (ref 11.4–15.2)
Prothrombin Time: 13.5 seconds (ref 11.4–15.2)
Smear Review: NONE SEEN
aPTT: 29 seconds (ref 24–36)
aPTT: 29 seconds (ref 24–36)

## 2020-11-10 LAB — MAGNESIUM: Magnesium: 2 mg/dL (ref 1.7–2.4)

## 2020-11-10 LAB — GLUCOSE, CAPILLARY
Glucose-Capillary: 186 mg/dL — ABNORMAL HIGH (ref 70–99)
Glucose-Capillary: 118 mg/dL — ABNORMAL HIGH (ref 70–99)
Glucose-Capillary: 139 mg/dL — ABNORMAL HIGH (ref 70–99)
Glucose-Capillary: 102 mg/dL — ABNORMAL HIGH (ref 70–99)
Glucose-Capillary: 98 mg/dL (ref 70–99)

## 2020-11-10 LAB — HEMOGLOBIN AND HEMATOCRIT, BLOOD
HCT: 28.9 % — ABNORMAL LOW (ref 36.0–46.0)
HCT: 27.3 % — ABNORMAL LOW (ref 36.0–46.0)
HCT: 27 % — ABNORMAL LOW (ref 36.0–46.0)
Hemoglobin: 10.1 g/dL — ABNORMAL LOW (ref 12.0–15.0)
Hemoglobin: 9.3 g/dL — ABNORMAL LOW (ref 12.0–15.0)
Hemoglobin: 9.5 g/dL — ABNORMAL LOW (ref 12.0–15.0)

## 2020-11-10 LAB — POTASSIUM: Potassium: 3.7 mmol/L (ref 3.5–5.1)

## 2020-11-10 LAB — TYPE AND SCREEN
ABO/RH(D): O POS
Antibody Screen: NEGATIVE
Unit division: 0
Unit division: 0

## 2020-11-10 LAB — HIV ANTIBODY (ROUTINE TESTING W REFLEX): HIV Screen 4th Generation wRfx: NONREACTIVE

## 2020-11-10 LAB — CBC
HCT: 29.9 % — ABNORMAL LOW (ref 36.0–46.0)
Hemoglobin: 10.1 g/dL — ABNORMAL LOW (ref 12.0–15.0)
MCH: 28.7 pg (ref 26.0–34.0)
MCHC: 33.8 g/dL (ref 30.0–36.0)
MCV: 84.9 fL (ref 80.0–100.0)
Platelets: 196 10*3/uL (ref 150–400)
RBC: 3.52 MIL/uL — ABNORMAL LOW (ref 3.87–5.11)
RDW: 15.4 % (ref 11.5–15.5)
WBC: 14.6 10*3/uL — ABNORMAL HIGH (ref 4.0–10.5)
nRBC: 0 % (ref 0.0–0.2)

## 2020-11-10 LAB — PREPARE FRESH FROZEN PLASMA
Unit division: 0
Unit division: 0
Unit division: 0
Unit division: 0

## 2020-11-10 LAB — PHOSPHORUS: Phosphorus: 2.8 mg/dL (ref 2.5–4.6)

## 2020-11-10 LAB — MRSA PCR SCREENING: MRSA by PCR: NEGATIVE

## 2020-11-10 LAB — MASSIVE TRANSFUSION PROTOCOL ORDER (BLOOD BANK NOTIFICATION)

## 2020-11-10 MED ORDER — LIDOCAINE VISCOUS HCL 2 % MT SOLN
15.0000 mL | Freq: Once | OROMUCOSAL | Status: AC
  Administered 2020-11-10: 15 mL via ORAL
  Filled 2020-11-10: qty 15

## 2020-11-10 MED ORDER — HYDROCHLOROTHIAZIDE 12.5 MG PO CAPS
12.5000 mg | ORAL_CAPSULE | Freq: Every day | ORAL | Status: DC
  Administered 2020-11-10 – 2020-11-11 (×2): 12.5 mg via ORAL
  Filled 2020-11-10 (×2): qty 1

## 2020-11-10 MED ORDER — ALUM & MAG HYDROXIDE-SIMETH 200-200-20 MG/5ML PO SUSP
30.0000 mL | Freq: Once | ORAL | Status: AC
  Administered 2020-11-10: 30 mL via ORAL
  Filled 2020-11-10: qty 30

## 2020-11-10 MED ORDER — ACETAMINOPHEN 325 MG PO TABS: 650.0000 mg | ORAL_TABLET | ORAL | Status: DC | PRN

## 2020-11-10 MED ORDER — MELATONIN 5 MG PO TABS: 2.5000 mg | ORAL_TABLET | Freq: Every day | ORAL | Status: DC

## 2020-11-10 MED ORDER — LISINOPRIL-HYDROCHLOROTHIAZIDE 10-12.5 MG PO TABS: 1.0000 | ORAL_TABLET | Freq: Every day | ORAL | Status: DC

## 2020-11-10 MED ORDER — CHLORHEXIDINE GLUCONATE CLOTH 2 % EX PADS
6.0000 | MEDICATED_PAD | Freq: Every day | CUTANEOUS | Status: DC
  Administered 2020-11-10: 6 via TOPICAL

## 2020-11-10 MED ORDER — LISINOPRIL 10 MG PO TABS
10.0000 mg | ORAL_TABLET | Freq: Every day | ORAL | Status: DC
  Administered 2020-11-10 – 2020-11-11 (×2): 10 mg via ORAL
  Filled 2020-11-10: qty 2
  Filled 2020-11-10: qty 1

## 2020-11-10 NOTE — Consult Note (Signed)
PHARMACY CONSULT NOTE - FOLLOW UP  Pharmacy Consult for Electrolyte Monitoring and Replacement   Recent Labs: Potassium (mmol/L)  Date Value  11/10/2020 3.7  11/10/2020 3.7  08/24/2014 3.2 (L)   Magnesium (mg/dL)  Date Value  11/10/2020 2.0   Calcium (mg/dL)  Date Value  11/10/2020 7.8 (L)   Calcium, Total (mg/dL)  Date Value  08/24/2014 8.6   Albumin (g/dL)  Date Value  11/09/2020 3.5  08/24/2014 3.6   Phosphorus (mg/dL)  Date Value  11/10/2020 2.8   Sodium (mmol/L)  Date Value  11/10/2020 138  12/26/2014 140  08/24/2014 137   Assessment: 55 y/o female presented with rectal bleeding x 3 days and syncopal episode. S/p 3 units pRBC in ED. Pharmacy has been consulted for electrolyte monitor and replacement as needed.   Goal of Therapy:  Electrolytes WNL  Plan:   Ca 7.8 - continue to monitor  Monitor electrolytes with AM labs  Benn Moulder, PharmD Pharmacy Resident  11/10/2020 3:03 PM

## 2020-11-10 NOTE — Anesthesia Postprocedure Evaluation (Signed)
Anesthesia Post Note  Patient: Rachael Aguilar  Procedure(s) Performed: ESOPHAGOGASTRODUODENOSCOPY (EGD) (N/A ) FLEXIBLE SIGMOIDOSCOPY (N/A )  Patient location during evaluation: SICU Anesthesia Type: General Level of consciousness: awake and alert and oriented Pain management: pain level controlled Vital Signs Assessment: post-procedure vital signs reviewed and stable Respiratory status: spontaneous breathing Cardiovascular status: stable Postop Assessment: no apparent nausea or vomiting Anesthetic complications: no   No complications documented.   Last Vitals:  Vitals:   11/10/20 1100 11/10/20 1200  BP: (!) 141/82 (!) 154/127  Pulse: 86 95  Resp: 18 18  Temp:    SpO2: 100% 100%    Last Pain:  Vitals:   11/10/20 0800  TempSrc:   PainSc: 0-No pain                 Rachael Aguilar [name]

## 2020-11-10 NOTE — Progress Notes (Signed)
NAME:  Rachael Aguilar, MRN:  [id number], DOB:  [birth year], LOS: 1 ADMISSION DATE:  11/09/2020, CONSULTATION DATE:  11/09/2020 REFERRING MD:  Dr. Jacqualine Code, CHIEF COMPLAINT:  Acute GI bleed  Brief History   55 y.o female presenting with acute rectal bleeding and multiple syncopal episodes.  History of present illness   55 y.o female with significant PMH as below presenting to [facility] ED today with chief complaints of rectal bleeding since Friday.  Patient report that she had 3 large bloody stools on Friday followed by a syncopal episode. She does not recall how long she was on the floor or if she hit her head. Denies associated symptoms preceding the syncope of nausea and vomiting, feeling cold or clammy, visual auras or blurry vision, palpitations shortness of breath or chest pain. She denies loss of bladder control or tongue biting.  Patient states that she went to bed and woke up the next morning with cramping abdominal pain. She did not have further episodes of rectal bleeding on Saturday however she report that when she wiped her rectum, there was blood on the tissue paper. Patient had two more episodes today which prompted her to come the ED for further evaluation. Denies use of over the counter NSAIDs, BC powder or alcohol use. She has hx of GERD but denies any recent worsening symptoms or hematemesis.   On arrival to the ED, he was afebrile with blood pressure 163/110 mm Hg and pulse rate 140 beats/min. Bp dropped to 83/50 mm Hg, RR 32. There were no focal neurological deficits; she was alert and oriented x4, and he did not demonstrate any memory deficits.  She got up to use the bathroom and had another witness syncopal episode in the ED. She was noted to have another large pool of blood from what she stated was coming from her rectum. Initial labs revealed K+ 2.8, mag 1.6, Hgb 8.1 down form baseline 13.9. CT head and CTabdomen /pelvis ordered and pending. PCCM consulted for admission.  Past Medical  History  Anxiety and Depression Hypertension Bipolar Disorder GERD Cholecystitis s/p cholecystectomy  Significant Hospital Events   11/09/2020: Admitted to PCCM service with massive GI Bleed 11/09/2020: Underwent EGD and Colonoscopy~ found to have sigmoid diverticulosis 11/10/2020: No reported bleeding, Hemodynamically stable, Hgb stable, likely can transfer out of ICU  Consults:  General Surgery GI  Procedures:  11/09/2020: EGD>>The esophagus was normal.      One non-bleeding cratered gastric ulcer with no stigmata of bleeding was       found in the prepyloric region of the stomach. The lesion was 8 mm in       largest dimension.      Diffuse minimal inflammation characterized by erythema was found in the       entire examined stomach.      The examined duodenum was normal.      The exam was otherwise without abnormality. 11/09/2020: Colonoscopy>> - The mid transverse colon is normal.                        - Blood in the sigmoid colon.                        - Diverticulosis in the sigmoid colon.                        - No specimens collected.                        -  The exam was suboptimal due to patient preparation.                        - Based upon findings above, I suspect patient has a                         sigmoid colon bleed, either from vascular ectasia                         and/or sigmoid diverticulum. No active bleeding is                         noted at the time of the examination despite repeated                         'to and fro' evaluation of the sigmoid and left colon.  Significant Diagnostic Tests:  11/09/2020: CT Abdomen and Pelvis>> 1. No evidence of abdominal aortic aneurysm or dissection. No focal source of active bleeding is identified. 2. Diffuse colonic wall thickening with mural edema and submucosal fat deposition. Changes may represent infectious or inflammatory colitis. Pseudomembranous colitis possible but less likely. 3. Marked enlargement of the  uterus consistent with large fibroids. 4. Fluid distends the vagina. 11/09/2020: CT head> Negative for acute intracranial abnormality  Micro Data:  11/09/2020: SARS-CoV-2 PCR>> negative 11/09/2020: Influenza PCR>>negative 11/09/2020: HIV screen>> nonreactive  Antimicrobials:  n/a  Objective   Blood pressure 122/77, pulse 94, temperature 99.1 F (37.3 C), temperature source Oral, resp. rate 17, height 5\' 4"  (1.626 m), weight 70.3 kg, last menstrual period 10/13/2020, SpO2 99 %.        Intake/Output Summary (Last 24 hours) at 11/10/2020 1006 Last data filed at 11/10/2020 4401 Gross per 24 hour  Intake 2069.36 ml  Output 1400 ml  Net 669.36 ml   Filed Weights   11/09/20 1639 11/09/20 2300 11/10/20 0341  Weight: 76.7 kg 70.3 kg 70.3 kg    Physical Examination  GENERAL:  Acutely ill-appearing female, sitting in bed, on room air, no acute distress EYES: Pupils PERRLA, no scleral icterus HEENT: Atraumatic, normocephalic, neck supple, no JVD, no thyromegaly.  LUNGS: Clear breath sounds bilaterally, no wheezing or rales noted, normal effort, no accessory muscle use CARDIOVASCULAR: Regular rate and rhythm, S1-S2, no murmurs, rubs, gallops, 2+ distal pulses ABDOMEN: Soft, nontender, nondistended, no guarding rebound tenderness, bowel sounds positive x4 EXTREMITIES: No deformities, no edema, no clubbing NEUROLOGIC: Awake, alert and oriented x4, follows commands, no focal deficits, speech clear SKIN: Warm and dry.  No obvious rashes, lesions, ulcerations  Resolved Hospital Problem list   Hemorrhagic shock  Assessment & Plan:   Hemorrhagic shock due to Acute Lower GI Bleed>>resolved PMHx of Hypertension -Continuous cardiac monitoring -Maintain MAP >65 -IV fluids -Transfusions as indicated -Vasopressors if needed, currently not requiring  -Will restart home Lisinopril/HCTZ   Acute Lower GI Bleed Acute Blood Loss Anemia -Monitor for S/Sx of bleeding -Trend CBC (H&H q6h) -SCD's for VTE  Prophylaxis (no chemical prophylaxis due to GI bleed) -Transfuse for Hgb <8 -Protonix 40 mg IV BID -GI following and General Surgery following, appreciate input -S/p EGD & Colonoscopy on 11/10/20 ~ EGD: Normal esophagus. Non-bleeding gastric ulcer with no stigmata of bleeding. Gastritis. Normal examined duodenum.  Colonoscopy:  The mid transverse colon is normal. Blood in the sigmoid colon. Diverticulosis in the sigmoid  colon. No specimens collected.The exam was suboptimal due to patient preparation. Based upon findings above, I suspect patient has a sigmoid colon bleed, either from vascular ectasia and/or sigmoid diverticulum. No active bleeding is noted at the time of the examination despite repeated  'to and fro' evaluation of the sigmoid and left colon. -Clear liquid diet -H. Pylori antigen + stool Ag pending -Avoid NSAIDS, Steroids, ASA   Syncope, suspect  Vasovagal  Vs. ? Orthostatic in the setting of massive GI bleed with hemorraghic shock - Treat acute blood loss as above - Review Electrolytes - EKG - Telemetry + Pulse Ox monitoring - Blood sugar QAC/HS - Follow H&H - CT head pending>>negative   Hypokalemia>>resolved Hypomagnesium -Monitor I&O's / urinary output -Follow BMP -Ensure adequate renal perfusion -Avoid nephrotoxic agents as able -Replace electrolytes as indicated   Anxiety and Depression PMHx: Bipolar Disorder - Continue Venlafaxine XR   Best practice:  Diet: Clear liquids Pain/Anxiety/Delirium protocol (if indicated): NO VAP protocol (if indicated): N/A DVT prophylaxis: Contraindicated, SCD's GI prophylaxis: PPI Glucose control: SSI Mobility: Bedrest Code Status: FULL Family Communication: Updated pt at bedside 11/10/20 Disposition: Downgrade to Med-Surg  Labs   CBC: Recent Labs  Lab 11/09/20 1641 11/09/20 1813 11/09/20 1913 11/09/20 2246 11/10/20 0633 11/10/20 0943  WBC 10.4  --   --   --  14.6*  --   HGB 8.1* 6.8*  --  10.8* 10.1* 10.1*   HCT 24.5* 20.6*  --  31.9* 29.9* 28.9*  MCV 87.5  --   --   --  84.9  --   PLT 313  --  239 179 196  --     Basic Metabolic Panel: Recent Labs  Lab 11/09/20 1641 11/10/20 0059  NA 139 138  K 2.8* 3.7  3.7  CL 108 111  CO2 23 20*  GLUCOSE 151* 122*  BUN 11 7  CREATININE 0.75 0.72  CALCIUM 8.4* 7.8*  MG 1.6* 2.0  PHOS  --  2.8   GFR: Estimated Creatinine Clearance: 77.3 mL/min (by C-G formula based on SCr of 0.72 mg/dL). Recent Labs  Lab 11/09/20 1641 11/10/20 0633  WBC 10.4 14.6*    Liver Function Tests: Recent Labs  Lab 11/09/20 1641  AST 17  ALT 12  ALKPHOS 42  BILITOT 0.7  PROT 6.0*  ALBUMIN 3.5   No results for input(s): LIPASE, AMYLASE in the last 168 hours. No results for input(s): AMMONIA in the last 168 hours.  ABG No results found for: PHART, PCO2ART, PO2ART, HCO3, TCO2, ACIDBASEDEF, O2SAT   Coagulation Profile: Recent Labs  Lab 11/09/20 1641 11/09/20 1913 11/09/20 2246  INR 1.0 1.2 1.1    Cardiac Enzymes: No results for input(s): CKTOTAL, CKMB, CKMBINDEX, TROPONINI in the last 168 hours.  HbA1C: No results found for: HGBA1C  CBG: Recent Labs  Lab 11/09/20 2343 11/10/20 0345 11/10/20 0733  GLUCAP 106* 186* 118*    Review of Systems:   Positives in BOLD: Gen: Denies fever, chills, weight change, fatigue, night sweats HEENT: Denies blurred vision, double vision, hearing loss, tinnitus, sinus congestion, rhinorrhea, sore throat, neck stiffness, dysphagia PULM: Denies shortness of breath, cough, sputum production, hemoptysis, wheezing CV: Denies chest pain, edema, orthopnea, paroxysmal nocturnal dyspnea, palpitations GI: Denies abdominal pain, "adominal burning",nausea, vomiting, diarrhea, hematochezia, melena, constipation, change in bowel habits GU: Denies dysuria, hematuria, polyuria, oliguria, urethral discharge Endocrine: Denies hot or cold intolerance, polyuria, polyphagia or appetite change Derm: Denies rash, dry skin,  scaling or peeling skin change Heme:  Denies easy bruising, bleeding, bleeding gums Neuro: Denies headache, numbness, weakness, slurred speech, loss of memory or consciousness   Past Medical History  She,  has a past medical history of Bipolar depression ([place]), Hypertension, and Lower GI bleed.   Surgical History    Past Surgical History:  Procedure Laterality Date  . CHOLECYSTECTOMY N/A 04/02/2016   Procedure: LAPAROSCOPIC CHOLECYSTECTOMY;  Surgeon: Jules Husbands, MD;  Location: [facility] ORS;  Service: General;  Laterality: N/A;  . DILATION AND CURETTAGE OF UTERUS    . ESOPHAGOGASTRODUODENOSCOPY N/A 11/09/2020   Procedure: ESOPHAGOGASTRODUODENOSCOPY (EGD);  Surgeon: Toledo, Benay Pike, MD;  Location: [facility] ENDOSCOPY;  Service: Gastroenterology;  Laterality: N/A;  . FLEXIBLE SIGMOIDOSCOPY N/A 11/09/2020   Procedure: FLEXIBLE SIGMOIDOSCOPY;  Surgeon: Toledo, Benay Pike, MD;  Location: [facility] ENDOSCOPY;  Service: Gastroenterology;  Laterality: N/A;     Social History   reports that she has never smoked. She has never used smokeless tobacco. She reports current alcohol use of about 10.0 standard drinks of alcohol per week.   Family History   Her family history includes Hypertension in her mother; Lupus in her maternal grandmother; Ovarian cancer in her mother; Rheum arthritis in her maternal aunt; Throat cancer in her mother.   Allergies No Known Allergies   Home Medications  Prior to Admission medications   Medication Sig Start Date End Date Taking? Authorizing Provider  HYDROcodone-acetaminophen (NORCO/VICODIN) 5-325 MG tablet Take 1-2 tablets by mouth every 4 (four) hours as needed for moderate pain. 04/03/16   Pabon, Diego F, MD  lisinopril-hydrochlorothiazide (PRINZIDE,ZESTORETIC) 10-12.5 MG tablet Take 1 tablet by mouth daily. 05/31/16   [name], MD  ranitidine (ZANTAC) 150 MG capsule Take by mouth. 12/26/14   [provider]  venlafaxine XR (EFFEXOR XR) 75 MG 24 hr  capsule Take 1 capsule (75 mg total) by mouth daily with breakfast. 06/01/17   [name], MD     Critical care time: 35 minutes     Darel Hong, [facility] [place] [facility] Pager: [phone]

## 2020-11-10 NOTE — Plan of Care (Signed)
Discussed with patient plan of care for the shift, pain management and setting up pure wick with some teach back displayed.  Problem: Pain Managment: Goal: General experience of comfort will improve Outcome: Progressing   Problem: Education: Goal: Ability to identify signs and symptoms of gastrointestinal bleeding will improve Outcome: Progressing

## 2020-11-10 NOTE — Plan of Care (Signed)
  Problem: Activity: Goal: Risk for activity intolerance will decrease Outcome: Progressing   Problem: Elimination: Goal: Will not experience complications related to urinary retention Outcome: Progressing   Problem: Pain Managment: Goal: General experience of comfort will improve Outcome: Progressing   Problem: Safety: Goal: Ability to remain free from injury will improve Outcome: Progressing   Problem: Bowel/Gastric: Goal: Will show no signs and symptoms of gastrointestinal bleeding Outcome: Progressing

## 2020-11-10 NOTE — Progress Notes (Signed)
Ok to advance diet per [place], [place].

## 2020-11-10 NOTE — Progress Notes (Signed)
[facility] Gastroenterology Inpatient Progress Note  Subjective: Patient has H/H stable. Not in bed.   Objective: Vital signs in last 24 hours: Temp:  [96.9 F (36.1 C)-99.1 F (37.3 C)] 98.3 F (36.8 C) (04/04 1646) Pulse Rate:  [77-124] 94 (04/04 1646) Resp:  [11-32] 21 (04/04 1608) BP: (83-175)/(50-127) 168/93 (04/04 1646) SpO2:  [97 %-100 %] 100 % (04/04 1646) Weight:  [70.3 kg] 70.3 kg (04/04 0341) Blood pressure (!) 168/93, pulse 94, temperature 98.3 F (36.8 C), temperature source Oral, resp. rate (!) 21, height 5\' 4"  (1.626 m), weight 70.3 kg, last menstrual period 10/13/2020, SpO2 100 %.    Intake/Output from previous day: 04/03 0701 - 04/04 0700 In: 1979.4 [P.O.:390; I.V.:1382.8; IV Piggyback:206.6] Out: 1400 [Urine:1400]  Intake/Output this shift: Total I/O In: 752.4 [P.O.:290; I.V.:462.4] Out: -     Lab Results: Results for orders placed or performed during the hospital encounter of 11/09/20 (from the past 24 hour(s))  Prepare RBC (crossmatch)     Status: None   Collection Time: 11/09/20  5:42 PM  Result Value Ref Range   Order Confirmation      ORDER PROCESSED BY BLOOD BANK Performed at [facility], [address] [place], [place] [address]   Resp Panel by RT-PCR (Flu A&B, Covid) Nasopharyngeal Swab     Status: None   Collection Time: 11/09/20  5:45 PM   Specimen: Nasopharyngeal Swab; Nasopharyngeal(NP) swabs in vial transport medium  Result Value Ref Range   SARS Coronavirus 2 by RT PCR NEGATIVE NEGATIVE   Influenza A by PCR NEGATIVE NEGATIVE   Influenza B by PCR NEGATIVE NEGATIVE  ABO/Rh     Status: None   Collection Time: 11/09/20  5:49 PM  Result Value Ref Range   ABO/RH(D)      O POS Performed at [facility], [address] [place], [place] [address]   Hemoglobin and hematocrit, blood (STAT)     Status: Abnormal   Collection Time: 11/09/20  6:13 PM  Result Value Ref Range   Hemoglobin 6.8 (L) 12.0 - 15.0 g/dL    HCT 20.6 (L) 36.0 - 46.0 %  Initiate MTP (Blood Bank Notification)     Status: None   Collection Time: 11/09/20  6:13 PM  Result Value Ref Range   Initiate Massive Transfusion Protocol      STARTED AT 1818 ON 11/09/20 ENDED AT 2217 ON 11/09/20 MTP ORDER RECEIVED Performed at [facility], [address] [place], [place] [address]   Prepare fresh frozen plasma     Status: None   Collection Time: 11/09/20  6:16 PM  Result Value Ref Range   Unit Number [id number]    Blood Component Type THW PLS APHR    Unit division 00    Status of Unit REL FROM [facility]    Transfusion Status OK TO TRANSFUSE    Unit Number [id number]    Blood Component Type THW PLS APHR    Unit division 00    Status of Unit REL FROM [facility]    Transfusion Status OK TO TRANSFUSE    Unit Number [id number]    Blood Component Type THW PLS APHR    Unit division 00    Status of Unit REL FROM [facility]    Transfusion Status OK TO TRANSFUSE    Unit Number [id number]    Blood Component Type THW PLS APHR    Unit division 00    Status of Unit REL FROM [facility]    Transfusion  Status OK TO TRANSFUSE   DIC Panel now then every 30 minutes     Status: Abnormal   Collection Time: 11/09/20  7:13 PM  Result Value Ref Range   Prothrombin Time 15.1 11.4 - 15.2 seconds   INR 1.2 0.8 - 1.2   aPTT 29 24 - 36 seconds   Fibrinogen 209 (L) 210 - 475 mg/dL   D-Dimer, Quant 0.34 0.00 - 0.50 ug/mL-FEU   Platelets 239 150 - 400 K/uL  DIC Panel now then every 30 minutes     Status: None   Collection Time: 11/09/20 10:46 PM  Result Value Ref Range   Prothrombin Time 13.5 11.4 - 15.2 seconds   INR 1.1 0.8 - 1.2   aPTT 29 24 - 36 seconds   Fibrinogen 250 210 - 475 mg/dL   D-Dimer, Quant 0.35 0.00 - 0.50 ug/mL-FEU   Platelets 179 150 - 400 K/uL   Smear Review NO SCHISTOCYTES SEEN   Hemoglobin and hematocrit, blood (STAT)     Status: Abnormal   Collection Time: 11/09/20 10:46 PM  Result Value Ref Range   Hemoglobin 10.8 (L)  12.0 - 15.0 g/dL   HCT 31.9 (L) 36.0 - 46.0 %  HIV Antibody (routine testing w rflx)     Status: None   Collection Time: 11/09/20 10:46 PM  Result Value Ref Range   HIV Screen 4th Generation wRfx Non Reactive Non Reactive  Glucose, capillary     Status: Abnormal   Collection Time: 11/09/20 11:43 PM  Result Value Ref Range   Glucose-Capillary 106 (H) 70 - 99 mg/dL  MRSA PCR Screening     Status: None   Collection Time: 11/10/20 12:19 AM   Specimen: Nasal Mucosa; Nasopharyngeal  Result Value Ref Range   MRSA by PCR NEGATIVE NEGATIVE  Potassium     Status: None   Collection Time: 11/10/20 12:59 AM  Result Value Ref Range   Potassium 3.7 3.5 - 5.1 mmol/L  Phosphorus     Status: None   Collection Time: 11/10/20 12:59 AM  Result Value Ref Range   Phosphorus 2.8 2.5 - 4.6 mg/dL  Basic metabolic panel     Status: Abnormal   Collection Time: 11/10/20 12:59 AM  Result Value Ref Range   Sodium 138 135 - 145 mmol/L   Potassium 3.7 3.5 - 5.1 mmol/L   Chloride 111 98 - 111 mmol/L   CO2 20 (L) 22 - 32 mmol/L   Glucose, Bld 122 (H) 70 - 99 mg/dL   BUN 7 6 - 20 mg/dL   Creatinine, Ser 0.72 0.44 - 1.00 mg/dL   Calcium 7.8 (L) 8.9 - 10.3 mg/dL   GFR, Estimated >60 >60 mL/min   Anion gap 7 5 - 15  Magnesium     Status: None   Collection Time: 11/10/20 12:59 AM  Result Value Ref Range   Magnesium 2.0 1.7 - 2.4 mg/dL  Glucose, capillary     Status: Abnormal   Collection Time: 11/10/20  3:45 AM  Result Value Ref Range   Glucose-Capillary 186 (H) 70 - 99 mg/dL  CBC     Status: Abnormal   Collection Time: 11/10/20  6:33 AM  Result Value Ref Range   WBC 14.6 (H) 4.0 - 10.5 K/uL   RBC 3.52 (L) 3.87 - 5.11 MIL/uL   Hemoglobin 10.1 (L) 12.0 - 15.0 g/dL   HCT 29.9 (L) 36.0 - 46.0 %   MCV 84.9 80.0 - 100.0 fL   MCH 28.7 26.0 -  34.0 pg   MCHC 33.8 30.0 - 36.0 g/dL   RDW 15.4 11.5 - 15.5 %   Platelets 196 150 - 400 K/uL   nRBC 0.0 0.0 - 0.2 %  Glucose, capillary     Status: Abnormal    Collection Time: 11/10/20  7:33 AM  Result Value Ref Range   Glucose-Capillary 118 (H) 70 - 99 mg/dL  Hemoglobin and hematocrit, blood     Status: Abnormal   Collection Time: 11/10/20  9:43 AM  Result Value Ref Range   Hemoglobin 10.1 (L) 12.0 - 15.0 g/dL   HCT 28.9 (L) 36.0 - 46.0 %  Glucose, capillary     Status: Abnormal   Collection Time: 11/10/20 11:52 AM  Result Value Ref Range   Glucose-Capillary 139 (H) 70 - 99 mg/dL  BLOOD TRANSFUSION REPORT - SCANNED     Status: None   Collection Time: 11/10/20  1:52 PM   Narrative   Ordered by an unspecified provider.  Glucose, capillary     Status: Abnormal   Collection Time: 11/10/20  3:36 PM  Result Value Ref Range   Glucose-Capillary 102 (H) 70 - 99 mg/dL  Hemoglobin and hematocrit, blood     Status: Abnormal   Collection Time: 11/10/20  4:07 PM  Result Value Ref Range   Hemoglobin 9.3 (L) 12.0 - 15.0 g/dL   HCT 27.3 (L) 36.0 - 46.0 %     Recent Labs    11/09/20 1641 11/09/20 1813 11/09/20 1913 11/09/20 2246 11/10/20 0633 11/10/20 0943 11/10/20 1607  WBC 10.4  --   --   --  14.6*  --   --   HGB 8.1*   < >  --  10.8* 10.1* 10.1* 9.3*  HCT 24.5*   < >  --  31.9* 29.9* 28.9* 27.3*  PLT 313  --  239 179 196  --   --    < > = values in this interval not displayed.   BMET Recent Labs    11/09/20 1641 11/10/20 0059  NA 139 138  K 2.8* 3.7  3.7  CL 108 111  CO2 23 20*  GLUCOSE 151* 122*  BUN 11 7  CREATININE 0.75 0.72  CALCIUM 8.4* 7.8*   LFT Recent Labs    11/09/20 1641  PROT 6.0*  ALBUMIN 3.5  AST 17  ALT 12  ALKPHOS 42  BILITOT 0.7   PT/INR Recent Labs    11/09/20 1913 11/09/20 2246  LABPROT 15.1 13.5  INR 1.2 1.1   Hepatitis Panel No results for input(s): HEPBSAG, HCVAB, HEPAIGM, HEPBIGM in the last 72 hours. C-Diff No results for input(s): CDIFFTOX in the last 72 hours. No results for input(s): CDIFFPCR in the last 72 hours.   Studies/Results: CT Head Wo Contrast  Result Date:  11/09/2020 CLINICAL DATA:  Head trauma.  Syncope. EXAM: CT HEAD WITHOUT CONTRAST TECHNIQUE: Contiguous axial images were obtained from the base of the skull through the vertex without intravenous contrast. COMPARISON:  None. FINDINGS: Brain: No evidence of acute large vascular territory infarction, hemorrhage, hydrocephalus, extra-axial collection or mass lesion/mass effect. Age advanced patchy white matter hypoattenuation, slightly progressed from prior. Vascular: No hyperdense vessel. Skull: No acute fracture. Sinuses/Orbits: Left sphenoid sinus mucosal thickening. Other: No mastoid effusions. IMPRESSION: 1. No evidence of acute intracranial abnormality. 2. Chronic age-advanced patchy white matter hypoattenuation, most likely secondary to chronic microvascular ischemic disease but nonspecific. An MRI could further characterize if clinically indicated. 3. Left sphenoid sinus mucosal thickening Electronically  Signed   By: [name] MD   On: 11/09/2020 18:56   CT Angio Abd/Pel W and/or Wo Contrast  Result Date: 11/09/2020 CLINICAL DATA:  Rectal bleeding for 3 days with dark red blood. Lower abdominal pain. Weakness and fatigue. EXAM: CTA ABDOMEN AND PELVIS WITHOUT AND WITH CONTRAST TECHNIQUE: Multidetector CT imaging of the abdomen and pelvis was performed using the standard protocol during bolus administration of intravenous contrast. Multiplanar reconstructed images and MIPs were obtained and reviewed to evaluate the vascular anatomy. CONTRAST:  152mL OMNIPAQUE IOHEXOL 350 MG/ML SOLN COMPARISON:  None. FINDINGS: VASCULAR Aorta: Unenhanced images demonstrate minimal calcification in the aorta. Calcified phleboliths in the pelvis. Arterial phase contrast-enhanced images demonstrate normal caliber abdominal aorta with full patency. No significant stenosis or occlusion. No aneurysm or dissection. Celiac: Patent without evidence of aneurysm, dissection, vasculitis or significant stenosis. SMA: Patent without  evidence of aneurysm, dissection, vasculitis or significant stenosis. Renals: Both renal arteries are patent without evidence of aneurysm, dissection, vasculitis, fibromuscular dysplasia or significant stenosis. IMA: Patent without evidence of aneurysm, dissection, vasculitis or significant stenosis. Inflow: Patent without evidence of aneurysm, dissection, vasculitis or significant stenosis. Proximal Outflow: Bilateral common femoral and visualized portions of the superficial and profunda femoral arteries are patent without evidence of aneurysm, dissection, vasculitis or significant stenosis. Veins: No obvious venous abnormality within the limitations of this arterial phase study. Review of the MIP images confirms the above findings. NON-VASCULAR Lower chest: Lung bases are clear. Hepatobiliary: No focal liver abnormality is seen. Status post cholecystectomy. No biliary dilatation. Pancreas: Unremarkable. No pancreatic ductal dilatation or surrounding inflammatory changes. Spleen: Normal in size without focal abnormality. Adrenals/Urinary Tract: Adrenal glands are unremarkable. Kidneys are normal, without renal calculi, focal lesion, or hydronephrosis. Bladder is unremarkable. Stomach/Bowel: Stomach, small bowel, and colon are not abnormally distended. Decompression limits evaluation but there appears to be wall thickening throughout the colon with some mural edema and submucosal fat deposition. No significant pericolonic stranding. Changes may represent infectious or inflammatory colitis. Pseudomembranous colitis possible but less likely. No focal intraluminal contrast pooling is identified. No focal source of active bleeding is identified. Lymphatic: No significant lymphadenopathy. Reproductive: Marked enlargement of the uterus consistent with large fibroids, measuring up to 9.5 x 12.1 cm. Fluid distends the vagina. No adnexal masses are identified. Other: No free air or free fluid in the abdomen. Abdominal wall  musculature appears intact. Musculoskeletal: No acute or significant osseous findings. IMPRESSION: 1. No evidence of abdominal aortic aneurysm or dissection. No focal source of active bleeding is identified. 2. Diffuse colonic wall thickening with mural edema and submucosal fat deposition. Changes may represent infectious or inflammatory colitis. Pseudomembranous colitis possible but less likely. 3. Marked enlargement of the uterus consistent with large fibroids. 4. Fluid distends the vagina. Electronically Signed   By: Lucienne Capers M.D.   On: 11/09/2020 19:21    Scheduled Inpatient Medications:   . Chlorhexidine Gluconate Cloth  6 each Topical Q0600  . lisinopril  10 mg Oral Daily   And  . hydrochlorothiazide  12.5 mg Oral Daily  . [START ON 11/13/2020] pantoprazole  40 mg Intravenous Q12H  . venlafaxine XR  75 mg Oral Q breakfast    Continuous Inpatient Infusions:   . lactated ringers 50 mL/hr at 11/10/20 1608  . pantoprazole (PROTONIX) 80 mg IVPB      PRN Inpatient Medications:  docusate sodium, polyethylene glycol    Assessment:  1. LGI bleed - likely diverticular bleed. Resolving 2. Hypovolemic shock from  GI bleed. 3. Hypertension - Antihypertensives have been resumed.  Plan:  1. Continue supportive care, IVF 2. Serial H/H. 3. Following.  Adyline Huberty K. Alice Reichert, M.D. 11/10/2020, 4:57 PM

## 2020-11-10 NOTE — Plan of Care (Signed)
  Problem: Activity: Goal: Risk for activity intolerance will decrease Outcome: Progressing   Problem: Elimination: Goal: Will not experience complications related to urinary retention Outcome: Progressing   Problem: Pain Managment: Goal: General experience of comfort will improve Outcome: Progressing   Problem: Safety: Goal: Ability to remain free from injury will improve Outcome: Progressing   Problem: Skin Integrity: Goal: Risk for impaired skin integrity will decrease Outcome: Progressing   Problem: Education: Goal: Ability to identify signs and symptoms of gastrointestinal bleeding will improve Outcome: Progressing   Problem: Bowel/Gastric: Goal: Will show no signs and symptoms of gastrointestinal bleeding Outcome: Progressing   Problem: Fluid Volume: Goal: Will show no signs and symptoms of excessive bleeding Outcome: Progressing

## 2020-11-10 NOTE — Progress Notes (Signed)
eLink Physician-Brief Progress Note Patient Name: Rachael Aguilar DOB: 23-Aug-1965 MRN: [id number]   Date of Service  11/10/2020  HPI/Events of Note  68F who presented with hemodynamically unstable GI bleed c/b syncope. She reported experiencing rectal bleeding since Friday. BP 83/50 shortly after arrival. CT Abd/Pelvis showed probable uterine fibroids, diffuse colonic wall thickening with mural edema ? Infectious vs inflammatory colitis.  Patient intubated and underwent EGD and flex-sig which blood in the sigmoid colon, diverticulosis in sigmoid colon, and no active bleeding. Recommended clear liquid diet, Protonix IV BID, serial H/H and checking H.pylori stool antigen to evaluate pre-pyloric ulcer that was seen on EGD.  On exam, patient is sleeping with normal (slightly hypertensive) vital signs. She was extubated post-procedure.   eICU Interventions  # Cardiac/GI: - S/p resuscitation for hemorrhagic shock secondary to brisk GIB. - Protonix IV Q12H (though low suspicion for gastric involvement). - Serial H/H - F/u H.pylori stool Ag. - mIVF.  DVT PPX: SCDs only GI PPX: Protonix IV BID     Intervention Category Evaluation Type: New Patient Evaluation  Marily Lente Jeraldean Wechter 11/10/2020, 12:07 AM

## 2020-11-11 LAB — BPAM RBC
Blood Product Expiration Date: 202204292359
Blood Product Expiration Date: 202205032359
Blood Product Expiration Date: 202205032359
Blood Product Expiration Date: 202205032359
Blood Product Expiration Date: 202205032359
Blood Product Expiration Date: 202205032359
Blood Product Expiration Date: 202205082359
Blood Product Expiration Date: 202205082359
ISSUE DATE / TIME: 202204031743
ISSUE DATE / TIME: 202204031743
ISSUE DATE / TIME: 202204031803
ISSUE DATE / TIME: 202204042251
Unit Type and Rh: 5100
Unit Type and Rh: 5100
Unit Type and Rh: 5100
Unit Type and Rh: 5100
Unit Type and Rh: 5100
Unit Type and Rh: 5100
Unit Type and Rh: 5100
Unit Type and Rh: 5100

## 2020-11-11 LAB — HEMOGLOBIN AND HEMATOCRIT, BLOOD
HCT: 27.3 % — ABNORMAL LOW (ref 36.0–46.0)
Hemoglobin: 9.3 g/dL — ABNORMAL LOW (ref 12.0–15.0)

## 2020-11-11 LAB — TYPE AND SCREEN
Unit division: 0
Unit division: 0
Unit division: 0
Unit division: 0

## 2020-11-11 LAB — BASIC METABOLIC PANEL
Anion gap: 5 (ref 5–15)
BUN: 7 mg/dL (ref 6–20)
CO2: 27 mmol/L (ref 22–32)
Calcium: 8.5 mg/dL — ABNORMAL LOW (ref 8.9–10.3)
Chloride: 107 mmol/L (ref 98–111)
Creatinine, Ser: 0.65 mg/dL (ref 0.44–1.00)
GFR, Estimated: >60 mL/min (ref >60)
Glucose, Bld: 112 mg/dL — ABNORMAL HIGH (ref 70–99)
Potassium: 3 mmol/L — ABNORMAL LOW (ref 3.5–5.1)
Sodium: 139 mmol/L (ref 135–145)

## 2020-11-11 LAB — GLUCOSE, CAPILLARY
Glucose-Capillary: 102 mg/dL — ABNORMAL HIGH (ref 70–99)
Glucose-Capillary: 98 mg/dL (ref 70–99)

## 2020-11-11 LAB — CBC
HCT: 27.5 % — ABNORMAL LOW (ref 36.0–46.0)
Hemoglobin: 9.4 g/dL — ABNORMAL LOW (ref 12.0–15.0)
MCH: 28.8 pg (ref 26.0–34.0)
MCHC: 34.2 g/dL (ref 30.0–36.0)
MCV: 84.4 fL (ref 80.0–100.0)
Platelets: 215 10*3/uL (ref 150–400)
RBC: 3.26 MIL/uL — ABNORMAL LOW (ref 3.87–5.11)
RDW: 15.4 % (ref 11.5–15.5)
WBC: 13.8 10*3/uL — ABNORMAL HIGH (ref 4.0–10.5)
nRBC: 0 % (ref 0.0–0.2)

## 2020-11-11 LAB — PREPARE RBC (CROSSMATCH)

## 2020-11-11 LAB — POTASSIUM: Potassium: 3.7 mmol/L (ref 3.5–5.1)

## 2020-11-11 LAB — PHOSPHORUS: Phosphorus: 2.9 mg/dL (ref 2.5–4.6)

## 2020-11-11 LAB — MAGNESIUM: Magnesium: 2 mg/dL (ref 1.7–2.4)

## 2020-11-11 MED ORDER — LISINOPRIL-HYDROCHLOROTHIAZIDE 10-12.5 MG PO TABS: 1.0000 | ORAL_TABLET | Freq: Every day | ORAL | 11 refills | Status: DC

## 2020-11-11 MED ORDER — POTASSIUM CHLORIDE 10 MEQ/100ML IV SOLN
10.0000 meq | INTRAVENOUS | Status: AC
  Administered 2020-11-11 (×4): 10 meq via INTRAVENOUS
  Filled 2020-11-11 (×4): qty 100

## 2020-11-11 MED ORDER — PANTOPRAZOLE SODIUM 40 MG PO TBEC: 40.0000 mg | DELAYED_RELEASE_TABLET | Freq: Every day | ORAL | 2 refills | Status: DC

## 2020-11-11 MED ORDER — ALUM & MAG HYDROXIDE-SIMETH 200-200-20 MG/5ML PO SUSP
30.0000 mL | ORAL | Status: DC | PRN
  Administered 2020-11-11: 30 mL via ORAL
  Filled 2020-11-11: qty 30

## 2020-11-11 MED ORDER — POTASSIUM CHLORIDE CRYS ER 20 MEQ PO TBCR
20.0000 meq | EXTENDED_RELEASE_TABLET | ORAL | Status: AC
  Administered 2020-11-11 (×2): 20 meq via ORAL
  Filled 2020-11-11 (×2): qty 1

## 2020-11-11 MED ORDER — PANTOPRAZOLE SODIUM 40 MG PO TBEC: DELAYED_RELEASE_TABLET | ORAL | 0 refills | Status: DC

## 2020-11-11 NOTE — Progress Notes (Signed)
Rachael Aguilar to be D/C'd home per MD order.  Discussed prescriptions and follow up appointments with the patient. Prescriptions given to patient, medication list explained in detail. Pt verbalized understanding.  Allergies as of 11/11/2020   No Known Allergies      Medication List     TAKE these medications    lisinopril-hydrochlorothiazide 10-12.5 MG tablet Commonly known as: Zestoretic Take 1 tablet by mouth daily.   pantoprazole 40 MG tablet Commonly known as: PROTONIX Take 1 tablet (40 mg total) by mouth daily.        Vitals:   11/11/20 1110 11/11/20 1634  BP: 129/80 120/87  Pulse: 81 73  Resp: 19 16  Temp: 98.1 F (36.7 C) 98.2 F (36.8 C)  SpO2: 100% 100%    Skin clean, dry and intact without evidence of skin break down, no evidence of skin tears noted. IV catheter discontinued intact. Site without signs and symptoms of complications. Dressing and pressure applied. Pt denies pain at this time. No complaints noted.  An After Visit Summary was printed and given to the patient. Patient escorted via [place], and D/C home via private auto.  [place] C. Deatra Ina

## 2020-11-11 NOTE — Consult Note (Signed)
PHARMACY CONSULT NOTE - FOLLOW UP  Pharmacy Consult for Electrolyte Monitoring and Replacement   Recent Labs: Potassium (mmol/L)  Date Value  11/11/2020 3.0 (L)  08/24/2014 3.2 (L)   Magnesium (mg/dL)  Date Value  11/11/2020 2.0   Calcium (mg/dL)  Date Value  11/11/2020 8.5 (L)   Calcium, Total (mg/dL)  Date Value  08/24/2014 8.6   Albumin (g/dL)  Date Value  11/09/2020 3.5  08/24/2014 3.6   Phosphorus (mg/dL)  Date Value  11/11/2020 2.9   Sodium (mmol/L)  Date Value  11/11/2020 139  12/26/2014 140  08/24/2014 137   Assessment: 55 y/o female presented with rectal bleeding x 3 days and syncopal episode. S/p 3 units pRBC in ED. Pharmacy has been consulted for electrolyte monitor and replacement as needed.   Hypokalemia: K 3.0   40 mEq oral K + 40 mEq IV KCl ordered  Goal of Therapy:  Electrolytes WNL  Plan:   Ca 8.5 - continue to monitor  K: 3.0: replenishment ordered. Re-check tonight @ 1800  Monitor electrolytes with AM labs  Dorothe Pea, PharmD, BCPS Clinical Pharmacist  11/11/2020 7:55 AM

## 2020-11-11 NOTE — Discharge Instructions (Signed)
Rachael Aguilar,  You were in the hospital because of bleeding and low blood pressure. Thankfully your bleeding has stopped and my be secondary to diverticulosis.

## 2020-11-11 NOTE — Care Management (Signed)
TOC consult for SNF placement discontinued.  Confirmed with MD and RN that patient is independent and has no mobility needs

## 2020-11-11 NOTE — Discharge Summary (Signed)
Physician Discharge Summary  Rachael Aguilar ZWC:[id number] DOB: 1966-05-30 DOA: 11/09/2020  PCP: [name], MD  Admit date: 11/09/2020 Discharge date: 11/11/2020  Admitted From: Home Disposition: Home  Recommendations for Outpatient Follow-up:  1. Follow up with PCP in 1 week 2. Follow-up with GI in 2-3 weeks 3. Please follow up on the following pending results: None  Home Health: None Equipment/Devices: None  Discharge Condition: Stable CODE STATUS: Full code Diet recommendation: Heart healthy   Brief/Interim Summary:  Admission HPI written by [name], NP  History of present illness  55 y.o female with significant PMH as below presenting to [facility] ED today with chief complaints of rectal bleeding since Friday.  Patient report that she had 3 large bloody stools on Friday followed by a syncopal episode. She does not recall how long she was on the floor or if she hit her head. Denies associated symptoms preceding the syncope ofnausea and vomiting, feeling cold or clammy, visual auras or blurry vision, palpitations shortness of breath or chest pain. She denies loss of bladder control or tongue biting.Patient states that she went to bed and woke up the next morning with cramping abdominal pain. She did not have further episodes of rectal bleeding on Saturday however she report that when she wiped her rectum, there was blood on the tissue paper. Patient had two more episodes today which prompted her to come the ED for further evaluation. Denies use of over the counter NSAIDs, BC powder or alcohol use. She has hx of GERD but denies any recent worsening symptoms or hematemesis.    Hospital course:  Hemorrhagic shock Secondary to GI bleeding and resultant blood loss. Patient initially managed in the ICU. Shock managed with fluid resuscitation and with stabilization of bleeding. Resolved.  Acute lower GI bleeding Acute blood loss anemia GI consulted. EGD significant  for normal esophagus and non-bleeding ulcer. Colonoscopy significant for blood in the sigmoid colon with associated evidence of diverticulosis with bleeding either from diverticulosis vs vascular ectasia per GI assessment. Patient received transfused PRBC.  GI recommendation for Protonix 40 mg daily on discharge. Outpatient GI follow-up.  Syncope Likely vasovagal vs orthostatic in setting of GI bleeding.  Hypokalemia Hypomagnesemia Electrolytes replaced as needed.  Primary hypertension Lisinopril-hydrochlorothiazide on discharge.   Discharge Diagnoses:  Principal Problem:   Acute GI bleeding Active Problems:   Diverticulosis   Hemorrhagic shock (HCC)    Discharge Instructions   Allergies as of 11/11/2020   No Known Allergies     Medication List    TAKE these medications   lisinopril-hydrochlorothiazide 10-12.5 MG tablet Commonly known as: Zestoretic Take 1 tablet by mouth daily.   pantoprazole 40 MG tablet Commonly known as: PROTONIX Take 1 tablet (40 mg total) by mouth daily.       Follow-up Information    [name], MD. Schedule an appointment as soon as possible for a visit in 1 week(s).   Specialty: Family Medicine Why: Hospital follow-up Contact information: [address] [place] [address] [phone]        [name], MD. Schedule an appointment as soon as possible for a visit in 2 week(s).   Specialty: Gastroenterology Why: Hospital follow-up Contact information: [place] [address] [phone]              No Known Allergies  Consultations:  PCCM  Gastroenterology   Procedures/Studies: CT Head Wo Contrast  Result Date: 11/09/2020 CLINICAL  DATA:  Head trauma.  Syncope. EXAM: CT HEAD WITHOUT CONTRAST TECHNIQUE: Contiguous axial images were obtained from the base of the skull through the vertex without intravenous contrast. COMPARISON:  None. FINDINGS: Brain: No evidence  of acute large vascular territory infarction, hemorrhage, hydrocephalus, extra-axial collection or mass lesion/mass effect. Age advanced patchy white matter hypoattenuation, slightly progressed from prior. Vascular: No hyperdense vessel. Skull: No acute fracture. Sinuses/Orbits: Left sphenoid sinus mucosal thickening. Other: No mastoid effusions. IMPRESSION: 1. No evidence of acute intracranial abnormality. 2. Chronic age-advanced patchy white matter hypoattenuation, most likely secondary to chronic microvascular ischemic disease but nonspecific. An MRI could further characterize if clinically indicated. 3. Left sphenoid sinus mucosal thickening Electronically Signed   By: [name] MD   On: 11/09/2020 18:56   CT Angio Abd/Pel W and/or Wo Contrast  Result Date: 11/09/2020 CLINICAL DATA:  Rectal bleeding for 3 days with dark red blood. Lower abdominal pain. Weakness and fatigue. EXAM: CTA ABDOMEN AND PELVIS WITHOUT AND WITH CONTRAST TECHNIQUE: Multidetector CT imaging of the abdomen and pelvis was performed using the standard protocol during bolus administration of intravenous contrast. Multiplanar reconstructed images and MIPs were obtained and reviewed to evaluate the vascular anatomy. CONTRAST:  127mL OMNIPAQUE IOHEXOL 350 MG/ML SOLN COMPARISON:  None. FINDINGS: VASCULAR Aorta: Unenhanced images demonstrate minimal calcification in the aorta. Calcified phleboliths in the pelvis. Arterial phase contrast-enhanced images demonstrate normal caliber abdominal aorta with full patency. No significant stenosis or occlusion. No aneurysm or dissection. Celiac: Patent without evidence of aneurysm, dissection, vasculitis or significant stenosis. SMA: Patent without evidence of aneurysm, dissection, vasculitis or significant stenosis. Renals: Both renal arteries are patent without evidence of aneurysm, dissection, vasculitis, fibromuscular dysplasia or significant stenosis. IMA: Patent without evidence of aneurysm,  dissection, vasculitis or significant stenosis. Inflow: Patent without evidence of aneurysm, dissection, vasculitis or significant stenosis. Proximal Outflow: Bilateral common femoral and visualized portions of the superficial and profunda femoral arteries are patent without evidence of aneurysm, dissection, vasculitis or significant stenosis. Veins: No obvious venous abnormality within the limitations of this arterial phase study. Review of the MIP images confirms the above findings. NON-VASCULAR Lower chest: Lung bases are clear. Hepatobiliary: No focal liver abnormality is seen. Status post cholecystectomy. No biliary dilatation. Pancreas: Unremarkable. No pancreatic ductal dilatation or surrounding inflammatory changes. Spleen: Normal in size without focal abnormality. Adrenals/Urinary Tract: Adrenal glands are unremarkable. Kidneys are normal, without renal calculi, focal lesion, or hydronephrosis. Bladder is unremarkable. Stomach/Bowel: Stomach, small bowel, and colon are not abnormally distended. Decompression limits evaluation but there appears to be wall thickening throughout the colon with some mural edema and submucosal fat deposition. No significant pericolonic stranding. Changes may represent infectious or inflammatory colitis. Pseudomembranous colitis possible but less likely. No focal intraluminal contrast pooling is identified. No focal source of active bleeding is identified. Lymphatic: No significant lymphadenopathy. Reproductive: Marked enlargement of the uterus consistent with large fibroids, measuring up to 9.5 x 12.1 cm. Fluid distends the vagina. No adnexal masses are identified. Other: No free air or free fluid in the abdomen. Abdominal wall musculature appears intact. Musculoskeletal: No acute or significant osseous findings. IMPRESSION: 1. No evidence of abdominal aortic aneurysm or dissection. No focal source of active bleeding is identified. 2. Diffuse colonic wall thickening with mural  edema and submucosal fat deposition. Changes may represent infectious or inflammatory colitis. Pseudomembranous colitis possible but less likely. 3. Marked enlargement of the uterus consistent with large fibroids. 4. Fluid distends the vagina. Electronically Signed   By:  Lucienne Capers M.D.   On: 11/09/2020 19:21      Subjective: No bleeding overnight. No abdominal pain.  Discharge Exam: Vitals:   11/11/20 1110 11/11/20 1634  BP: 129/80 120/87  Pulse: 81 73  Resp: 19 16  Temp: 98.1 F (36.7 C) 98.2 F (36.8 C)  SpO2: 100% 100%   Vitals:   11/11/20 0401 11/11/20 0819 11/11/20 1110 11/11/20 1634  BP: 124/77 114/68 129/80 120/87  Pulse: 82 80 81 73  Resp: 18 18 19 16   Temp: 98.7 F (37.1 C) 98.4 F (36.9 C) 98.1 F (36.7 C) 98.2 F (36.8 C)  TempSrc: Oral Oral Oral   SpO2: 99% 100% 100% 100%  Weight:      Height:        General: Pt is alert, awake, not in acute distress Cardiovascular: RRR, S1/S2 +, no rubs, no gallops Respiratory: CTA bilaterally, no wheezing, no rhonchi Abdominal: Soft, NT, ND, bowel sounds + Extremities: no edema, no cyanosis    The results of significant diagnostics from this hospitalization (including imaging, microbiology, ancillary and laboratory) are listed below for reference.     Microbiology: Recent Results (from the past 240 hour(s))  Resp Panel by RT-PCR (Flu A&B, Covid) Nasopharyngeal Swab     Status: None   Collection Time: 11/09/20  5:45 PM   Specimen: Nasopharyngeal Swab; Nasopharyngeal(NP) swabs in vial transport medium  Result Value Ref Range Status   SARS Coronavirus 2 by RT PCR NEGATIVE NEGATIVE Final    Comment: (NOTE) SARS-CoV-2 target nucleic acids are NOT DETECTED.  The SARS-CoV-2 RNA is generally detectable in upper respiratory specimens during the acute phase of infection. The lowest concentration of SARS-CoV-2 viral copies this assay can detect is 138 copies/mL. A negative result does not preclude  SARS-Cov-2 infection and should not be used as the sole basis for treatment or other patient management decisions. A negative result may occur with  improper specimen collection/handling, submission of specimen other than nasopharyngeal swab, presence of viral mutation(s) within the areas targeted by this assay, and inadequate number of viral copies(<138 copies/mL). A negative result must be combined with clinical observations, patient history, and epidemiological information. The expected result is Negative.  Fact Sheet for Patients:  EntrepreneurPulse.com.au  Fact Sheet for Healthcare Providers:  IncredibleEmployment.be  This test is no t yet approved or cleared by the [place] FDA and  has been authorized for detection and/or diagnosis of SARS-CoV-2 by FDA under an Emergency Use Authorization (EUA). This EUA will remain  in effect (meaning this test can be used) for the duration of the COVID-19 declaration under Section 564(b)(1) of the Act, 21 U.S.C.section 360bbb-3(b)(1), unless the authorization is terminated  or revoked sooner.       Influenza A by PCR NEGATIVE NEGATIVE Final   Influenza B by PCR NEGATIVE NEGATIVE Final    Comment: (NOTE) The Xpert Xpress SARS-CoV-2/FLU/RSV plus assay is intended as an aid in the diagnosis of influenza from Nasopharyngeal swab specimens and should not be used as a sole basis for treatment. Nasal washings and aspirates are unacceptable for Xpert Xpress SARS-CoV-2/FLU/RSV testing.  Fact Sheet for Patients: EntrepreneurPulse.com.au  Fact Sheet for Healthcare Providers: IncredibleEmployment.be  This test is not yet approved or cleared by the [place] FDA and has been authorized for detection and/or diagnosis of SARS-CoV-2 by FDA under an Emergency Use Authorization (EUA). This EUA will remain in effect (meaning this test can be used) for the duration of  the COVID-19 declaration under Section  564(b)(1) of the Act, 21 U.S.C. section 360bbb-3(b)(1), unless the authorization is terminated or revoked.  Performed at [facility], [address] [place], [place] [address]   MRSA PCR Screening     Status: None   Collection Time: 11/10/20 12:19 AM   Specimen: Nasal Mucosa; Nasopharyngeal  Result Value Ref Range Status   MRSA by PCR NEGATIVE NEGATIVE Final    Comment:        The GeneXpert MRSA Assay (FDA approved for NASAL specimens only), is one component of a comprehensive MRSA colonization surveillance program. It is not intended to diagnose MRSA infection nor to guide or monitor treatment for MRSA infections. Performed at [facility], [address] [place], [place] [address]      Labs: BNP (last 3 results) No results for input(s): BNP in the last 8760 hours. Basic Metabolic Panel: Recent Labs  Lab 11/09/20 1641 11/10/20 0059 11/11/20 0440 11/11/20 1801  NA 139 138 139  --   K 2.8* 3.7  3.7 3.0* 3.7  CL 108 111 107  --   CO2 23 20* 27  --   GLUCOSE 151* 122* 112*  --   BUN 11 7 7   --   CREATININE 0.75 0.72 0.65  --   CALCIUM 8.4* 7.8* 8.5*  --   MG 1.6* 2.0 2.0  --   PHOS  --  2.8 2.9  --    Liver Function Tests: Recent Labs  Lab 11/09/20 1641  AST 17  ALT 12  ALKPHOS 42  BILITOT 0.7  PROT 6.0*  ALBUMIN 3.5   No results for input(s): LIPASE, AMYLASE in the last 168 hours. No results for input(s): AMMONIA in the last 168 hours. CBC: Recent Labs  Lab 11/09/20 1641 11/09/20 1813 11/09/20 1913 11/09/20 2246 11/10/20 5852 11/10/20 0943 11/10/20 1607 11/10/20 2133 11/11/20 0440 11/11/20 1006  WBC 10.4  --   --   --  14.6*  --   --   --  13.8*  --   HGB 8.1*   < >  --  10.8* 10.1* 10.1* 9.3* 9.5* 9.4* 9.3*  HCT 24.5*   < >  --  31.9* 29.9* 28.9* 27.3* 27.0* 27.5* 27.3*  MCV 87.5  --   --   --  84.9  --   --   --  84.4  --   PLT 313  --  239 179 196  --   --   --  215  --    <  > = values in this interval not displayed.   Cardiac Enzymes: No results for input(s): CKTOTAL, CKMB, CKMBINDEX, TROPONINI in the last 168 hours. BNP: Invalid input(s): POCBNP CBG: Recent Labs  Lab 11/10/20 1152 11/10/20 1536 11/10/20 2054 11/11/20 0756 11/11/20 1111  GLUCAP 139* 102* 98 102* 98   D-Dimer Recent Labs    11/09/20 1913 11/09/20 2246  DDIMER 0.34 0.35   Hgb A1c No results for input(s): HGBA1C in the last 72 hours. Lipid Profile No results for input(s): CHOL, HDL, LDLCALC, TRIG, CHOLHDL, LDLDIRECT in the last 72 hours. Thyroid function studies No results for input(s): TSH, T4TOTAL, T3FREE, THYROIDAB in the last 72 hours.  Invalid input(s): FREET3 Anemia work up No results for input(s): VITAMINB12, FOLATE, FERRITIN, TIBC, IRON, RETICCTPCT in the last 72 hours. Urinalysis    Component Value Date/Time   COLORURINE YELLOW (A) 03/31/2016 2251   APPEARANCEUR HAZY (A) 03/31/2016 2251   APPEARANCEUR Clear 12/12/2012 0839   LABSPEC 1.025 03/31/2016  2251   LABSPEC 1.020 12/12/2012 0839   PHURINE 6.0 03/31/2016 2251   GLUCOSEU NEGATIVE 03/31/2016 2251   GLUCOSEU Negative 12/12/2012 0839   HGBUR NEGATIVE 03/31/2016 2251   BILIRUBINUR NEGATIVE 03/31/2016 2251   BILIRUBINUR Negative 12/12/2012 0839   KETONESUR TRACE (A) 03/31/2016 2251   PROTEINUR 30 (A) 03/31/2016 2251   NITRITE NEGATIVE 03/31/2016 2251   LEUKOCYTESUR 3+ (A) 03/31/2016 2251   LEUKOCYTESUR 1+ 12/12/2012 0839   Sepsis Labs Invalid input(s): PROCALCITONIN,  WBC,  LACTICIDVEN Microbiology Recent Results (from the past 240 hour(s))  Resp Panel by RT-PCR (Flu A&B, Covid) Nasopharyngeal Swab     Status: None   Collection Time: 11/09/20  5:45 PM   Specimen: Nasopharyngeal Swab; Nasopharyngeal(NP) swabs in vial transport medium  Result Value Ref Range Status   SARS Coronavirus 2 by RT PCR NEGATIVE NEGATIVE Final    Comment: (NOTE) SARS-CoV-2 target nucleic acids are NOT DETECTED.  The  SARS-CoV-2 RNA is generally detectable in upper respiratory specimens during the acute phase of infection. The lowest concentration of SARS-CoV-2 viral copies this assay can detect is 138 copies/mL. A negative result does not preclude SARS-Cov-2 infection and should not be used as the sole basis for treatment or other patient management decisions. A negative result may occur with  improper specimen collection/handling, submission of specimen other than nasopharyngeal swab, presence of viral mutation(s) within the areas targeted by this assay, and inadequate number of viral copies(<138 copies/mL). A negative result must be combined with clinical observations, patient history, and epidemiological information. The expected result is Negative.  Fact Sheet for Patients:  EntrepreneurPulse.com.au  Fact Sheet for Healthcare Providers:  IncredibleEmployment.be  This test is no t yet approved or cleared by the [place] FDA and  has been authorized for detection and/or diagnosis of SARS-CoV-2 by FDA under an Emergency Use Authorization (EUA). This EUA will remain  in effect (meaning this test can be used) for the duration of the COVID-19 declaration under Section 564(b)(1) of the Act, 21 U.S.C.section 360bbb-3(b)(1), unless the authorization is terminated  or revoked sooner.       Influenza A by PCR NEGATIVE NEGATIVE Final   Influenza B by PCR NEGATIVE NEGATIVE Final    Comment: (NOTE) The Xpert Xpress SARS-CoV-2/FLU/RSV plus assay is intended as an aid in the diagnosis of influenza from Nasopharyngeal swab specimens and should not be used as a sole basis for treatment. Nasal washings and aspirates are unacceptable for Xpert Xpress SARS-CoV-2/FLU/RSV testing.  Fact Sheet for Patients: EntrepreneurPulse.com.au  Fact Sheet for Healthcare Providers: IncredibleEmployment.be  This test is not yet approved or  cleared by the [place] FDA and has been authorized for detection and/or diagnosis of SARS-CoV-2 by FDA under an Emergency Use Authorization (EUA). This EUA will remain in effect (meaning this test can be used) for the duration of the COVID-19 declaration under Section 564(b)(1) of the Act, 21 U.S.C. section 360bbb-3(b)(1), unless the authorization is terminated or revoked.  Performed at [facility], [address] [place], [place] [address]   MRSA PCR Screening     Status: None   Collection Time: 11/10/20 12:19 AM   Specimen: Nasal Mucosa; Nasopharyngeal  Result Value Ref Range Status   MRSA by PCR NEGATIVE NEGATIVE Final    Comment:        The GeneXpert MRSA Assay (FDA approved for NASAL specimens only), is one component of a comprehensive MRSA colonization surveillance program. It is not intended to diagnose MRSA infection nor to guide  or monitor treatment for MRSA infections. Performed at [facility], [address], [place], [place] [address]      Time coordinating discharge: 35 minutes  SIGNED:   Cordelia Poche, MD [facility] 11/11/2020, 7:26 PM

## 2020-11-11 NOTE — Plan of Care (Signed)
No signs of bleeding overnight. Pt independent and able to use restroom without issues. Tele in place with HR 90-100's. Maalox administered for indigestion, effective. K 3.0 this am, and first 64meq po dose administered. Safety measures in place. Will continue to monitor. Problem: Activity: Goal: Risk for activity intolerance will decrease Outcome: Progressing   Problem: Elimination: Goal: Will not experience complications related to urinary retention Outcome: Progressing   Problem: Pain Managment: Goal: General experience of comfort will improve Outcome: Progressing   Problem: Safety: Goal: Ability to remain free from injury will improve Outcome: Progressing   Problem: Skin Integrity: Goal: Risk for impaired skin integrity will decrease Outcome: Progressing   Problem: Education: Goal: Ability to identify signs and symptoms of gastrointestinal bleeding will improve Outcome: Progressing   Problem: Bowel/Gastric: Goal: Will show no signs and symptoms of gastrointestinal bleeding Outcome: Progressing   Problem: Fluid Volume: Goal: Will show no signs and symptoms of excessive bleeding Outcome: Progressing

## 2020-11-11 NOTE — Progress Notes (Signed)
[facility] GI inpatient brief Progress Note  Patient vitals remain stable without significant rebleeding  Vitals:   11/11/20 1110 11/11/20 1634  BP: 129/80 120/87  Pulse: 81 73  Resp: 19 16  Temp: 98.1 F (36.7 C) 98.2 F (36.8 C)  SpO2: 100% 100%       Labs: Hgb 9.3. Stool H pylori not resulted.  Impression:  1. Presumed diverticular bleed - Resolved. 2. Gastric ulcer 3. Anemia secondary to GI blood loss - stable.   Plan:  1. Discharge to home okay from GI standpoint. 2. Continue pantoprazole 40mg  daily. 3. Follow up with my office [phone] in 2-3 weeks. 4. Outpatient colonoscopy will be arranged at follow up at [facility].    Thank you  T. Madolyn Frieze, M.D. ABIM Diplomate in Gastroenterology [place] [phone] - Cell

## 2020-11-12 ENCOUNTER — Telehealth: Payer: Self-pay

## 2020-11-12 NOTE — Telephone Encounter (Signed)
No HFU scheduled at this time. 

## 2020-11-12 NOTE — Telephone Encounter (Signed)
Transition Care Management Follow-up Telephone Call Date of discharge and from where: [facility] on 11/11/20 How have you been since you were released from the hospital? Doing good and had a BM today that was normal. Declines seeing blood in stool. Pt states her stomach is still burning but states she had that while in the hospital. Pt states it is due to acid and she is taking Maalox as needed. Pt states she slept well last night and appetite is normal. Pt has not checked b/p since being d/c but does have a b/p monitor at home and plans to keep check on it. Declines pain, bleeding, fever, SOB, weakness or n/v/d. Any questions or concerns? No   Items Reviewed: Did the pt receive and understand the discharge instructions provided? Yes  Medications obtained and verified? Yes  Any new allergies since your discharge? No  Dietary orders reviewed? No Do you have support at home? Yes  Other (ie: DME, Home Health, etc) N/A  Functional Questionnaire: (I = Independent and D = Dependent)  Bathing/Dressing- I   Meal Prep- I  Eating- I  Maintaining continence- I  Transferring/Ambulation- I  Managing Meds- I   Follow up appointments reviewed:   PCP Hospital f/u appt confirmed? Yes  scheduled to see Laverna Peace on 11/19/20 @ 3:40 PM (no available apt slots with PCP). [facility] f/u appt confirmed?  No, pt to call GI and schedule follow up apt today.   Are transportation arrangements needed? No  If their condition worsens, is the pt aware to call  their PCP or go to the ED? Yes Was the patient provided with contact information for the PCP's office or ED? Yes Was the pt encouraged to call back with questions or concerns? Yes

## 2020-11-19 ENCOUNTER — Ambulatory Visit (INDEPENDENT_AMBULATORY_CARE_PROVIDER_SITE_OTHER): Payer: Self-pay | Admitting: Adult Health

## 2020-11-19 DIAGNOSIS — Z5329 Procedure and treatment not carried out because of patient's decision for other reasons: Secondary | ICD-10-CM

## 2020-11-19 DIAGNOSIS — K5790 Diverticulosis of intestine, part unspecified, without perforation or abscess without bleeding: Secondary | ICD-10-CM

## 2020-11-19 DIAGNOSIS — R578 Other shock: Secondary | ICD-10-CM

## 2020-11-24 NOTE — Progress Notes (Signed)
No show for office visit.

## 2020-11-25 ENCOUNTER — Inpatient Hospital Stay: Payer: Self-pay | Admitting: Family Medicine

## 2020-11-26 ENCOUNTER — Emergency Department: Payer: Self-pay

## 2020-11-26 ENCOUNTER — Other Ambulatory Visit: Payer: Self-pay

## 2020-11-26 ENCOUNTER — Emergency Department
Admission: EM | Admit: 2020-11-26 | Discharge: 2020-11-26 | Disposition: A | Discharge status: Home / Self Care | Payer: Self-pay | Attending: Emergency Medicine | Admitting: Emergency Medicine

## 2020-11-26 DIAGNOSIS — N3090 Cystitis, unspecified without hematuria: Secondary | ICD-10-CM | POA: Insufficient documentation

## 2020-11-26 DIAGNOSIS — I10 Essential (primary) hypertension: Secondary | ICD-10-CM | POA: Insufficient documentation

## 2020-11-26 DIAGNOSIS — D259 Leiomyoma of uterus, unspecified: Secondary | ICD-10-CM | POA: Insufficient documentation

## 2020-11-26 DIAGNOSIS — Z79899 Other long term (current) drug therapy: Secondary | ICD-10-CM | POA: Insufficient documentation

## 2020-11-26 LAB — CBC WITH DIFFERENTIAL/PLATELET
Abs Immature Granulocytes: 0.04 10*3/uL (ref 0.00–0.07)
Basophils Absolute: 0 10*3/uL (ref 0.0–0.1)
Basophils Relative: 0 %
Eosinophils Absolute: 0.1 10*3/uL (ref 0.0–0.5)
Eosinophils Relative: 0 %
HCT: 27.4 % — ABNORMAL LOW (ref 36.0–46.0)
Hemoglobin: 9 g/dL — ABNORMAL LOW (ref 12.0–15.0)
Immature Granulocytes: 0 %
Lymphocytes Relative: 18 %
Lymphs Abs: 2.4 10*3/uL (ref 0.7–4.0)
MCH: 28 pg (ref 26.0–34.0)
MCHC: 32.8 g/dL (ref 30.0–36.0)
MCV: 85.4 fL (ref 80.0–100.0)
Monocytes Absolute: 0.6 10*3/uL (ref 0.1–1.0)
Monocytes Relative: 4 %
Neutro Abs: 10.2 10*3/uL — ABNORMAL HIGH (ref 1.7–7.7)
Neutrophils Relative %: 78 %
Platelets: 875 10*3/uL — ABNORMAL HIGH (ref 150–400)
RBC: 3.21 MIL/uL — ABNORMAL LOW (ref 3.87–5.11)
RDW: 14.7 % (ref 11.5–15.5)
WBC: 13.2 10*3/uL — ABNORMAL HIGH (ref 4.0–10.5)
nRBC: 0 % (ref 0.0–0.2)

## 2020-11-26 LAB — URINALYSIS, COMPLETE (UACMP) WITH MICROSCOPIC
Bacteria, UA: NONE SEEN
Bilirubin Urine: NEGATIVE
Glucose, UA: NEGATIVE mg/dL
Ketones, ur: 5 mg/dL — AB
Nitrite: NEGATIVE
Protein, ur: NEGATIVE mg/dL
Specific Gravity, Urine: 1.023 (ref 1.005–1.030)
WBC, UA: >50 WBC/hpf — ABNORMAL HIGH (ref 0–5)
pH: 6 (ref 5.0–8.0)

## 2020-11-26 LAB — COMPREHENSIVE METABOLIC PANEL
ALT: 22 U/L (ref 0–44)
AST: 17 U/L (ref 15–41)
Albumin: 3 g/dL — ABNORMAL LOW (ref 3.5–5.0)
Alkaline Phosphatase: 76 U/L (ref 38–126)
Anion gap: 9 (ref 5–15)
BUN: 13 mg/dL (ref 6–20)
CO2: 27 mmol/L (ref 22–32)
Calcium: 9 mg/dL (ref 8.9–10.3)
Chloride: 101 mmol/L (ref 98–111)
Creatinine, Ser: 0.83 mg/dL (ref 0.44–1.00)
GFR, Estimated: >60 mL/min (ref >60)
Glucose, Bld: 134 mg/dL — ABNORMAL HIGH (ref 70–99)
Potassium: 3.3 mmol/L — ABNORMAL LOW (ref 3.5–5.1)
Sodium: 137 mmol/L (ref 135–145)
Total Bilirubin: 0.4 mg/dL (ref 0.3–1.2)
Total Protein: 6.8 g/dL (ref 6.5–8.1)

## 2020-11-26 LAB — LIPASE, BLOOD: Lipase: 35 U/L (ref 11–51)

## 2020-11-26 MED ORDER — CEPHALEXIN 500 MG PO CAPS
500.0000 mg | ORAL_CAPSULE | Freq: Once | ORAL | Status: AC
  Administered 2020-11-26: 500 mg via ORAL
  Filled 2020-11-26: qty 1

## 2020-11-26 MED ORDER — MORPHINE SULFATE (PF) 4 MG/ML IV SOLN
4.0000 mg | Freq: Once | INTRAVENOUS | Status: AC
  Administered 2020-11-26: 4 mg via INTRAVENOUS
  Filled 2020-11-26: qty 1

## 2020-11-26 MED ORDER — IOHEXOL 300 MG/ML  SOLN
100.0000 mL | Freq: Once | INTRAMUSCULAR | Status: AC | PRN
  Administered 2020-11-26: 100 mL via INTRAVENOUS

## 2020-11-26 MED ORDER — CEPHALEXIN 500 MG PO CAPS: 500.0000 mg | ORAL_CAPSULE | Freq: Two times a day (BID) | ORAL | 0 refills | Status: AC

## 2020-11-26 MED ORDER — LACTATED RINGERS IV BOLUS
1000.0000 mL | Freq: Once | INTRAVENOUS | Status: AC
  Administered 2020-11-26: 1000 mL via INTRAVENOUS

## 2020-11-26 NOTE — ED Notes (Signed)
Pt given ice chips following okay from provider.

## 2020-11-26 NOTE — ED Triage Notes (Signed)
Pt states for the past few days she has had lower abd pain, pt states she has hx of fibroids., Pt states she has had some blood in her urine also, denies pain with urination.

## 2020-11-26 NOTE — ED Provider Notes (Signed)
[facility] Emergency Department Provider Note   ____________________________________________   Event Date/Time   First MD Initiated Contact with Patient 11/26/20 2054     (approximate)  I have reviewed the triage vital signs and the nursing notes.   HISTORY  Chief Complaint Abdominal Pain    HPI Rachael Aguilar is a 55 y.o. female with past medical history of hypertension, bipolar disorder, and GERD who presents to the ED complaining of abdominal pain.  Patient reports he has increasing pain in her lower abdomen and suprapubic area over the past couple of days.  Pain is sharp and constant, not exacerbated or alleviated by anything.  She did notice some blood in her urine earlier today, but denies any dysuria.  She has not had any fevers or flank pain, denies changes in her bowel movements, nausea, or vomiting.  She was recently admitted to the hospital for diverticular GI bleed, states she has been doing well since then with no blood in her stool.        Past Medical History:  Diagnosis Date  . Bipolar depression ([place])   . Hypertension   . Lower GI bleed     Patient Active Problem List   Diagnosis Date Noted  . Diverticulosis 11/19/2020  . Hemorrhagic shock ([place]) 11/19/2020  . Acute GI bleeding 11/09/2020  . Acute cholecystitis 04/01/2016  . RUQ pain   . Anxiety and depression 01/14/2015  . Gastro-esophageal reflux disease without esophagitis 01/14/2015  . BP (high blood pressure) 01/14/2015  . Adiposity 01/14/2015    Past Surgical History:  Procedure Laterality Date  . CHOLECYSTECTOMY N/A 04/02/2016   Procedure: LAPAROSCOPIC CHOLECYSTECTOMY;  Surgeon: Jules Husbands, MD;  Location: [facility] ORS;  Service: General;  Laterality: N/A;  . DILATION AND CURETTAGE OF UTERUS    . ESOPHAGOGASTRODUODENOSCOPY N/A 11/09/2020   Procedure: ESOPHAGOGASTRODUODENOSCOPY (EGD);  Surgeon: Toledo, Benay Pike, MD;  Location: [facility] ENDOSCOPY;  Service: Gastroenterology;   Laterality: N/A;  . FLEXIBLE SIGMOIDOSCOPY N/A 11/09/2020   Procedure: FLEXIBLE SIGMOIDOSCOPY;  Surgeon: Toledo, Benay Pike, MD;  Location: [facility] ENDOSCOPY;  Service: Gastroenterology;  Laterality: N/A;    Prior to Admission medications   Medication Sig Start Date End Date Taking? Authorizing Provider  cephALEXin (KEFLEX) 500 MG capsule Take 1 capsule (500 mg total) by mouth 2 (two) times daily for 7 days. 11/26/20 12/03/20 Yes Blake Divine, MD  lisinopril-hydrochlorothiazide (ZESTORETIC) 10-12.5 MG tablet Take 1 tablet by mouth daily. 11/11/20 11/11/21  Mariel Aloe, MD  pantoprazole (PROTONIX) 40 MG tablet Take 1 tablet (40 mg total) by mouth daily. 11/11/20 02/09/21  Mariel Aloe, MD    Allergies Patient has no known allergies.  Family History  Problem Relation Age of Onset  . Ovarian cancer Mother   . Throat cancer Mother   . Hypertension Mother   . Lupus Maternal Grandmother   . Rheum arthritis Maternal Aunt     Social History Social History   Tobacco Use  . Smoking status: Never Smoker  . Smokeless tobacco: Never Used  Substance Use Topics  . Alcohol use: Yes    Alcohol/week: 10.0 standard drinks    Types: 10 Shots of liquor per week    Review of Systems  Constitutional: No fever/chills Eyes: No visual changes. ENT: No sore throat. Cardiovascular: Denies chest pain. Respiratory: Denies shortness of breath. Gastrointestinal: Positive for abdominal pain.  No nausea, no vomiting.  No diarrhea.  No constipation. Genitourinary: Negative for dysuria.  Positive for hematuria. Musculoskeletal:  Negative for back pain. Skin: Negative for rash. Neurological: Negative for headaches, focal weakness or numbness.  ____________________________________________   PHYSICAL EXAM:  VITAL SIGNS: ED Triage Vitals  Enc Vitals Group     BP 11/26/20 2059 (!) 148/98     Pulse Rate 11/26/20 2059 (!) 116     Resp 11/26/20 2059 16     Temp 11/26/20 2059 98.2 F (36.8 C)     Temp  Source 11/26/20 2059 Oral     SpO2 11/26/20 2059 100 %     Weight 11/26/20 2058 140 lb (63.5 kg)     Height 11/26/20 2058 5\' 4"  (1.626 m)     Head Circumference --      Peak Flow --      Pain Score 11/26/20 2058 10     Pain Loc --      Pain Edu? --      Excl. in [place]? --     Constitutional: Alert and oriented. Eyes: Conjunctivae are normal. Head: Atraumatic. Nose: No congestion/rhinnorhea. Mouth/Throat: Mucous membranes are moist. Neck: Normal ROM Cardiovascular: Normal rate, regular rhythm. Grossly normal heart sounds. Respiratory: Normal respiratory effort.  No retractions. Lungs CTAB. Gastrointestinal: Soft and tender to palpation in suprapubic area with no rebound or guarding.  No CVA tenderness bilaterally. No distention. Genitourinary: deferred Musculoskeletal: No lower extremity tenderness nor edema. Neurologic:  Normal speech and language. No gross focal neurologic deficits are appreciated. Skin:  Skin is warm, dry and intact. No rash noted. Psychiatric: Mood and affect are normal. Speech and behavior are normal.  ____________________________________________   LABS (all labs ordered are listed, but only abnormal results are displayed)  Labs Reviewed  CBC WITH DIFFERENTIAL/PLATELET - Abnormal; Notable for the following components:      Result Value   WBC 13.2 (*)    RBC 3.21 (*)    Hemoglobin 9.0 (*)    HCT 27.4 (*)    Platelets 875 (*)    Neutro Abs 10.2 (*)    All other components within normal limits  COMPREHENSIVE METABOLIC PANEL - Abnormal; Notable for the following components:   Potassium 3.3 (*)    Glucose, Bld 134 (*)    Albumin 3.0 (*)    All other components within normal limits  URINALYSIS, COMPLETE (UACMP) WITH MICROSCOPIC - Abnormal; Notable for the following components:   Color, Urine YELLOW (*)    APPearance HAZY (*)    Hgb urine dipstick MODERATE (*)    Ketones, ur 5 (*)    Leukocytes,Ua LARGE (*)    WBC, UA >50 (*)    All other components  within normal limits  URINE CULTURE  LIPASE, BLOOD     PROCEDURES  Procedure(s) performed (including Critical Care):  Procedures   ____________________________________________   INITIAL IMPRESSION / ASSESSMENT AND PLAN / ED COURSE       55 year old female with past medical history of hypertension, bipolar disorder, and GERD who presents to the ED with 2 days of gradually worsening suprapubic abdominal pain associated with hematuria.  Labs show stable H&H from the time of discharge for her recent GI bleed.  LFTs and lipase are within normal limits but UA does appear concerning for infection.  CT scan was performed and shows necrotic appearing uterine fibroid, which could be contributing to her pain along with UTI.  We will send urine for culture and patient given initial dose of Keflex.  She is appropriate for discharge home on antibiotics for UTI, was also provided with follow-up  with OB/GYN for her uterine fibroids.  Patient counseled to return to the ED for new worsening symptoms, patient agrees with plan.      ____________________________________________   FINAL CLINICAL IMPRESSION(S) / ED DIAGNOSES  Final diagnoses:  Cystitis  Uterine leiomyoma, unspecified location     ED Discharge Orders         Ordered    cephALEXin (KEFLEX) 500 MG capsule  2 times daily        11/26/20 2302           Note:  This document was prepared using Dragon voice recognition software and may include unintentional dictation errors.   Blake Divine, MD 11/27/20 [phone]

## 2020-11-28 LAB — URINE CULTURE: Culture: 40000 — AB

## 2020-12-08 ENCOUNTER — Inpatient Hospital Stay: Payer: Self-pay | Admitting: Family Medicine

## 2020-12-08 NOTE — Progress Notes (Deleted)
      Established patient visit   Patient: Rachael Aguilar   DOB: [birth year]   55 y.o. Female  MRN: [id number] Visit Date: 12/08/2020  Today's healthcare provider: Wilhemena Durie, MD   No chief complaint on file.  Subjective    HPI  Follow up Hospitalization  Patient was admitted to [facility] on 11/09/2020 and discharged on 11/11/2020. She was treated for GI bleed. Treatment for this included; see notes in chart. Telephone follow up was done on 11/12/2020 She reports {excellent/good/fair:19992} compliance with treatment. She reports this condition is {resolved/improved/worsened:23923}.  --------------------------------------------------------------------------------------- Follow up ER visit  Patient was seen in ER for Abdominal pain on 11/26/2020. She was treated for UTI. Treatment for this included; given Keflex. She reports {DESC; EXCELLENT/GOOD/FAIR:19992} compliance with treatment. She reports this condition is {improved/worse/unchanged:3041574}.  -----------------------------------------------------------------------------------------     {Show patient history (optional):23778::" "}   Medications: Outpatient Medications Prior to Visit  Medication Sig  . lisinopril-hydrochlorothiazide (ZESTORETIC) 10-12.5 MG tablet Take 1 tablet by mouth daily.  . pantoprazole (PROTONIX) 40 MG tablet Take 1 tablet (40 mg total) by mouth daily.   No facility-administered medications prior to visit.    Review of Systems  Constitutional: Negative for appetite change, chills, fatigue and fever.  Respiratory: Negative for chest tightness and shortness of breath.   Cardiovascular: Negative for chest pain and palpitations.  Gastrointestinal: Negative for abdominal pain, nausea and vomiting.  Neurological: Negative for dizziness and weakness.    {Labs  Heme  Chem  Endocrine  Serology  Results Review (optional):23779::" "}   Objective    LMP 10/13/2020 (LMP Unknown)  {Show  previous vital signs (optional):23777::" "}   Physical Exam  ***  No results found for any visits on 12/08/20.  Assessment & Plan     ***  No follow-ups on file.      {provider attestation***:1}   Wilhemena Durie, MD  [facility] [phone] (phone) [phone] (fax)  [place]

## 2020-12-16 ENCOUNTER — Ambulatory Visit: Payer: Self-pay | Admitting: Family Medicine

## 2021-04-21 ENCOUNTER — Ambulatory Visit (INDEPENDENT_AMBULATORY_CARE_PROVIDER_SITE_OTHER): Payer: Self-pay | Admitting: Family Medicine

## 2021-04-21 ENCOUNTER — Other Ambulatory Visit: Payer: Self-pay

## 2021-04-21 ENCOUNTER — Encounter: Payer: Self-pay | Admitting: Family Medicine

## 2021-04-21 VITALS — BP 158/82 | HR 90 | Resp 16 | Ht 64.0 in | Wt 154.0 lb

## 2021-04-21 DIAGNOSIS — M542 Cervicalgia: Secondary | ICD-10-CM

## 2021-04-21 DIAGNOSIS — I10 Essential (primary) hypertension: Secondary | ICD-10-CM

## 2021-04-21 DIAGNOSIS — K219 Gastro-esophageal reflux disease without esophagitis: Secondary | ICD-10-CM

## 2021-04-21 MED ORDER — CYCLOBENZAPRINE HCL 10 MG PO TABS: 10.0000 mg | ORAL_TABLET | Freq: Three times a day (TID) | ORAL | 0 refills | Status: DC | PRN

## 2021-04-21 MED ORDER — MELOXICAM 15 MG PO TABS: 15.0000 mg | ORAL_TABLET | Freq: Every day | ORAL | 3 refills | Status: DC
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
## 2021-09-25 NOTE — Progress Notes (Unsigned)
°  ° ° °  Established patient visit   Patient: Rachael Aguilar   DOB: 1966-07-26   56 y.o. Female  MRN: [id number] Visit Date: 09/28/2021  Today's healthcare provider: Wilhemena Durie, MD   No chief complaint on file.  Subjective    HPI  Hypertension, follow-up  BP Readings from Last 3 Encounters:  04/21/21 (!) 158/82  11/26/20 (!) 135/94  11/11/20 120/87   Wt Readings from Last 3 Encounters:  04/21/21 154 lb (69.9 kg)  11/26/20 140 lb (63.5 kg)  11/10/20 154 lb 15.7 oz (70.3 kg)     She was last seen for hypertension 5 months ago.  BP at that visit was 158/82.  Management since that visit includes; on lisinopril-HCTZ.  She reports {excellent/good/fair/poor:19665} compliance with treatment. She {is/is not:9024} having side effects. {document side effects if present:1} She is following a {diet:21022986} diet. She {is/is not:9024} exercising. She {does/does not:200015} smoke.  Use of agents associated with hypertension: {bp agents assoc with hypertension:511::"none"}.   Outside blood pressures are {***enter patient reported home BP readings, or 'not being checked':1}.  Pertinent labs: Lab Results  Component Value Date   CHOL 242 (A) 12/26/2014   HDL 46 12/26/2014   LDLCALC 180 12/26/2014   TRIG 82 12/26/2014   Lab Results  Component Value Date   NA 137 11/26/2020   K 3.3 (L) 11/26/2020   CREATININE 0.83 11/26/2020   GFRNONAA >60 11/26/2020   GLUCOSE 134 (H) 11/26/2020   TSH 0.86 12/26/2014     The ASCVD Risk score (Arnett DK, et al., 2019) failed to calculate for the following reasons:   Cannot find a previous HDL lab   Cannot find a previous total cholesterol lab   ---------------------------------------------------------------------------------------------------   Medications: Outpatient Medications Prior to Visit  Medication Sig   cyclobenzaprine (FLEXERIL) 10 MG tablet Take 1 tablet (10 mg total) by mouth 3 (three) times daily as needed for muscle  spasms.   lisinopril-hydrochlorothiazide (ZESTORETIC) 10-12.5 MG tablet Take 1 tablet by mouth daily.   meloxicam (MOBIC) 15 MG tablet Take 1 tablet (15 mg total) by mouth daily.   pantoprazole (PROTONIX) 40 MG tablet Take 1 tablet (40 mg total) by mouth daily.   No facility-administered medications prior to visit.    Review of Systems  Constitutional:  Negative for appetite change, chills, fatigue and fever.  Respiratory:  Negative for chest tightness and shortness of breath.   Cardiovascular:  Negative for chest pain and palpitations.  Gastrointestinal:  Negative for abdominal pain, nausea and vomiting.  Neurological:  Negative for dizziness and weakness.   {Labs   Heme   Chem   Endocrine   Serology   Results Review (optional):23779}   Objective    There were no vitals taken for this visit. {Show previous vital signs (optional):23777}  Physical Exam  ***  No results found for any visits on 09/28/21.  Assessment & Plan     ***  No follow-ups on file.      {provider attestation***:1}   Wilhemena Durie, MD  [facility] [phone] (phone) [phone] (fax)  [place]

## 2021-09-28 ENCOUNTER — Ambulatory Visit: Payer: Self-pay | Admitting: Family Medicine

## 2021-10-01 ENCOUNTER — Ambulatory Visit: Payer: Self-pay | Admitting: Family Medicine

## 2021-12-08 ENCOUNTER — Ambulatory Visit: Payer: Self-pay | Admitting: Family Medicine

## 2021-12-08 NOTE — Progress Notes (Deleted)
    Argentina Ponder Bettejane Leavens,acting as a scribe for Wilhemena Durie, MD.,have documented all relevant documentation on the behalf of Wilhemena Durie, MD,as directed by  Wilhemena Durie, MD while in the presence of Wilhemena Durie, MD.    Established patient visit   Patient: Rachael Aguilar   DOB: Apr 26, 1966   56 y.o. Female  MRN: [id number] Visit Date: 12/08/2021  Today's healthcare provider: Wilhemena Durie, MD   No chief complaint on file.  Subjective    HPI  Hypertension, follow-up  BP Readings from Last 3 Encounters:  04/21/21 (!) 158/82  11/26/20 (!) 135/94  11/11/20 120/87   Wt Readings from Last 3 Encounters:  04/21/21 154 lb (69.9 kg)  11/26/20 140 lb (63.5 kg)  11/10/20 154 lb 15.7 oz (70.3 kg)     She was last seen for hypertension 8 months ago.  BP at that visit was as above. Management since that visit includes none.  Symptoms: {Yes/No:20286} chest pain {Yes/No:20286} chest pressure  {Yes/No:20286} palpitations {Yes/No:20286} syncope  {Yes/No:20286} dyspnea {Yes/No:20286} orthopnea  {Yes/No:20286} paroxysmal nocturnal dyspnea {Yes/No:20286} lower extremity edema   Pertinent labs Lab Results  Component Value Date   CHOL 242 (A) 12/26/2014   HDL 46 12/26/2014   LDLCALC 180 12/26/2014   TRIG 82 12/26/2014   Lab Results  Component Value Date   NA 137 11/26/2020   K 3.3 (L) 11/26/2020   CREATININE 0.83 11/26/2020   GFRNONAA >60 11/26/2020   GLUCOSE 134 (H) 11/26/2020   TSH 0.86 12/26/2014     The ASCVD Risk score (Arnett DK, et al., 2019) failed to calculate for the following reasons:   Cannot find a previous HDL lab   Cannot find a previous total cholesterol lab  ---------------------------------------------------------------------------------------------------   Medications: Outpatient Medications Prior to Visit  Medication Sig   cyclobenzaprine (FLEXERIL) 10 MG tablet Take 1 tablet (10 mg total) by mouth 3 (three) times daily as needed  for muscle spasms.   lisinopril-hydrochlorothiazide (ZESTORETIC) 10-12.5 MG tablet Take 1 tablet by mouth daily.   meloxicam (MOBIC) 15 MG tablet Take 1 tablet (15 mg total) by mouth daily.   pantoprazole (PROTONIX) 40 MG tablet Take 1 tablet (40 mg total) by mouth daily.   No facility-administered medications prior to visit.    Review of Systems  {Labs  Heme  Chem  Endocrine  Serology  Results Review (optional):23779}   Objective    There were no vitals taken for this visit. {Show previous vital signs (optional):23777}  Physical Exam  ***  No results found for any visits on 12/08/21.  Assessment & Plan     ***  No follow-ups on file.      {provider attestation***:1}   Wilhemena Durie, MD  [facility] [phone] (phone) [phone] (fax)  [place]

## 2021-12-17 ENCOUNTER — Ambulatory Visit: Payer: Self-pay | Admitting: Family Medicine

## 2021-12-23 ENCOUNTER — Encounter: Payer: Self-pay | Admitting: Family Medicine

## 2021-12-23 ENCOUNTER — Ambulatory Visit (INDEPENDENT_AMBULATORY_CARE_PROVIDER_SITE_OTHER): Payer: Self-pay | Admitting: Family Medicine

## 2021-12-23 VITALS — BP 177/104 | HR 98 | Resp 16 | Wt 160.0 lb

## 2021-12-23 DIAGNOSIS — I10 Essential (primary) hypertension: Secondary | ICD-10-CM

## 2021-12-23 DIAGNOSIS — M542 Cervicalgia: Secondary | ICD-10-CM

## 2021-12-23 MED ORDER — CYCLOBENZAPRINE HCL 10 MG PO TABS: 10.0000 mg | ORAL_TABLET | Freq: Three times a day (TID) | ORAL | 0 refills | Status: DC | PRN

## 2021-12-23 MED ORDER — AMLODIPINE BESYLATE 5 MG PO TABS: 5.0000 mg | ORAL_TABLET | Freq: Every day | ORAL | 0 refills | Status: DC

## 2021-12-23 MED ORDER — CYCLOBENZAPRINE HCL 10 MG PO TABS: 10.0000 mg | ORAL_TABLET | Freq: Three times a day (TID) | ORAL | 3 refills | Status: DC | PRN

## 2021-12-23 NOTE — Progress Notes (Signed)
Established patient visit  I,April Miller,acting as a scribe for Wilhemena Durie, MD.,have documented all relevant documentation on the behalf of Wilhemena Durie, MD,as directed by  Wilhemena Durie, MD while in the presence of Wilhemena Durie, MD.   Patient: Rachael Aguilar   DOB: Jul 18, 1966   56 y.o. Female  MRN: [id number] Visit Date: 12/23/2021  Today's healthcare provider: Wilhemena Durie, MD   Chief Complaint  Patient presents with   Follow-up   Hypertension   Subjective    HPI  Patient complains of neck pain which in the past has been aided by muscle relaxants.  She has never had it evaluated.  Hurts to turn her head either way.  No radicular symptoms.  Hypertension, follow-up  BP Readings from Last 3 Encounters:  12/23/21 (!) 177/104  04/21/21 (!) 158/82  11/26/20 (!) 135/94   Wt Readings from Last 3 Encounters:  12/23/21 160 lb (72.6 kg)  04/21/21 154 lb (69.9 kg)  11/26/20 140 lb (63.5 kg)     She was last seen for hypertension 8 months ago.  Management since that visit includes; taking lisinopril-hydrochlorothiazide 10-12.5 mg.  Outside blood pressures are not checking.  Pertinent labs Lab Results  Component Value Date   CHOL 242 (A) 12/26/2014   HDL 46 12/26/2014   LDLCALC 180 12/26/2014   TRIG 82 12/26/2014   Lab Results  Component Value Date   NA 137 11/26/2020   K 3.3 (L) 11/26/2020   CREATININE 0.83 11/26/2020   GFRNONAA >60 11/26/2020   GLUCOSE 134 (H) 11/26/2020   TSH 0.86 12/26/2014     The ASCVD Risk score (Arnett DK, et al., 2019) failed to calculate for the following reasons:   Cannot find a previous HDL lab   Cannot find a previous total cholesterol lab  ---------------------------------------------------------------------------------------------------   Medications: Outpatient Medications Prior to Visit  Medication Sig   lisinopril-hydrochlorothiazide (ZESTORETIC) 10-12.5 MG tablet Take 1 tablet by mouth daily.  (Patient not taking: Reported on 12/23/2021)   meloxicam (MOBIC) 15 MG tablet Take 1 tablet (15 mg total) by mouth daily. (Patient not taking: Reported on 12/23/2021)   pantoprazole (PROTONIX) 40 MG tablet Take 1 tablet (40 mg total) by mouth daily. (Patient not taking: Reported on 12/23/2021)   [DISCONTINUED] cyclobenzaprine (FLEXERIL) 10 MG tablet Take 1 tablet (10 mg total) by mouth 3 (three) times daily as needed for muscle spasms. (Patient not taking: Reported on 12/23/2021)   No facility-administered medications prior to visit.    Review of Systems  Constitutional:  Negative for appetite change, chills, fatigue and fever.  Respiratory:  Negative for chest tightness and shortness of breath.   Cardiovascular:  Negative for chest pain and palpitations.  Gastrointestinal:  Negative for abdominal pain, nausea and vomiting.  Neurological:  Negative for dizziness and weakness.   Last lipids Lab Results  Component Value Date   CHOL 242 (A) 12/26/2014   HDL 46 12/26/2014   LDLCALC 180 12/26/2014   TRIG 82 12/26/2014       Objective    BP (!) 177/104 (BP Location: Left Arm, Patient Position: Sitting, Cuff Size: Normal)   Pulse 98   Resp 16   Wt 160 lb (72.6 kg)   SpO2 100%   BMI 27.46 kg/m  BP Readings from Last 3 Encounters:  12/23/21 (!) 177/104  04/21/21 (!) 158/82  11/26/20 (!) 135/94   Wt Readings from Last 3 Encounters:  12/23/21 160 lb (72.6 kg)  04/21/21 154 lb (69.9 kg)  11/26/20 140 lb (63.5 kg)      Physical Exam Vitals reviewed.  Constitutional:      General: She is not in acute distress.    Appearance: She is well-developed.  HENT:     Head: Normocephalic and atraumatic.     Right Ear: Hearing normal.     Left Ear: Hearing normal.     Nose: Nose normal.  Eyes:     General: Lids are normal. No scleral icterus.       Right eye: No discharge.        Left eye: No discharge.     Conjunctiva/sclera: Conjunctivae normal.  Neck:     Comments: There is mild  loss of cervical lordosis.  There is minimal cervical muscle tenderness on exam.  The line itself over the cervical spine is nontender. Cardiovascular:     Rate and Rhythm: Normal rate and regular rhythm.     Heart sounds: Normal heart sounds.  Pulmonary:     Effort: Pulmonary effort is normal. No respiratory distress.  Abdominal:     Palpations: Abdomen is soft.  Musculoskeletal:     Cervical back: No rigidity or tenderness.  Lymphadenopathy:     Cervical: No cervical adenopathy.  Skin:    Findings: No lesion or rash.  Neurological:     General: No focal deficit present.     Mental Status: She is alert and oriented to person, place, and time.  Psychiatric:        Mood and Affect: Mood normal.        Speech: Speech normal.        Behavior: Behavior normal.        Thought Content: Thought content normal.        Judgment: Judgment normal.      No results found for any visits on 12/23/21.  Assessment & Plan     1. Primary hypertension  - amLODipine (NORVASC) 5 MG tablet; Take 1 tablet (5 mg total) by mouth daily.  Dispense: 90 tablet; Refill: 0  2. Neck pain Baseline x-ray.  Try muscle relaxant at bedtime.  Follow-up in 2 to 3 months for both issues. - DG Cervical Spine Complete - cyclobenzaprine (FLEXERIL) 10 MG tablet; Take 1 tablet (10 mg total) by mouth 3 (three) times daily as needed for muscle spasms.  Dispense: 30 tablet; Refill: 3   Return in about 3 months (around 03/25/2022).      I, Wilhemena Durie, MD, have reviewed all documentation for this visit. The documentation on 12/27/21 for the exam, diagnosis, procedures, and orders are all accurate and complete.    Melvenia Favela Cranford Mon, MD  [facility] [phone] (phone) [phone] (fax)  [place]

## 2021-12-25 ENCOUNTER — Other Ambulatory Visit: Payer: Self-pay | Admitting: Family Medicine

## 2021-12-25 DIAGNOSIS — I10 Essential (primary) hypertension: Secondary | ICD-10-CM

## 2021-12-25 DIAGNOSIS — M542 Cervicalgia: Secondary | ICD-10-CM

## 2021-12-25 NOTE — Telephone Encounter (Signed)
[place] called and spoke to [employer], Merchant navy officer about the refill(s) Cyclobenzaprine and Amlodipine requested. Advised they were sent on 12/23/21. She says they are on hold at the [place] location. Advised she requested them to come to this location, she says she will transfer them over.

## 2021-12-25 NOTE — Telephone Encounter (Signed)
Medication Refill - Medication: cyclobenzaprine (FLEXERIL) 10 MG tablet  amLODipine (NORVASC) 5 MG tablet  meloxicam (MOBIC) 15 MG tablet  Has the patient contacted their pharmacy? Yes.   (Agent: If no, request that the patient contact the pharmacy for the refill. If patient does not wish to contact the pharmacy document the reason why and proceed with request.) (Agent: If yes, when and what did the pharmacy advise?)  Preferred Pharmacy (with phone number or street name):  [place], [place] - [address] [place] [address]  Phone: [phone] Fax: [phone]   Has the patient been seen for an appointment in the last year OR does the patient have an upcoming appointment? Yes.    Agent: Please be advised that RX refills may take up to 3 business days. We ask that you follow-up with your pharmacy.

## 2021-12-25 NOTE — Telephone Encounter (Signed)
Requested medication (s) are due for refill today: Yes  Requested medication (s) are on the active medication list: Yes  Last refill:  04/21/21  Future visit scheduled: Yes  Notes to clinic:  Unable to refill per protocol due to failed labs, no updated results.      Requested Prescriptions  Pending Prescriptions Disp Refills   meloxicam (MOBIC) 15 MG tablet 30 tablet 3    Sig: Take 1 tablet (15 mg total) by mouth daily.     Analgesics:  COX2 Inhibitors Failed - 12/25/2021  5:25 PM      Failed - Manual Review: Labs are only required if the patient has taken medication for more than 8 weeks.      Failed - HGB in normal range and within 360 days    Hemoglobin  Date Value Ref Range Status  11/26/2020 9.0 (L) 12.0 - 15.0 g/dL Final   HGB  Date Value Ref Range Status  08/24/2014 13.5 12.0 - 16.0 g/dL Final         Failed - Cr in normal range and within 360 days    Creatinine  Date Value Ref Range Status  08/24/2014 0.85 0.60 - 1.30 mg/dL Final   Creatinine, Ser  Date Value Ref Range Status  11/26/2020 0.83 0.44 - 1.00 mg/dL Final         Failed - HCT in normal range and within 360 days    HCT  Date Value Ref Range Status  11/26/2020 27.4 (L) 36.0 - 46.0 % Final  08/24/2014 40.5 35.0 - 47.0 % Final         Failed - AST in normal range and within 360 days    AST  Date Value Ref Range Status  11/26/2020 17 15 - 41 U/L Final   SGOT(AST)  Date Value Ref Range Status  08/24/2014 24 15 - 37 Unit/L Final         Failed - ALT in normal range and within 360 days    ALT  Date Value Ref Range Status  11/26/2020 22 0 - 44 U/L Final   SGPT (ALT)  Date Value Ref Range Status  08/24/2014 32 U/L Final    Comment:    14-63 NOTE: New Reference Range 02/26/14          Failed - eGFR is 30 or above and within 360 days    EGFR (African American)  Date Value Ref Range Status  08/24/2014 >60 >66m/min Final  09/08/2013 >60  Final   GFR calc Af Amer  Date Value Ref  Range Status  04/01/2016 >60 >60 mL/min Final    Comment:    (NOTE) The eGFR has been calculated using the CKD EPI equation. This calculation has not been validated in all clinical situations. eGFR's persistently <60 mL/min signify possible Chronic Kidney Disease.    EGFR (Non-African Amer.)  Date Value Ref Range Status  08/24/2014 >60 >661mmin Final    Comment:    eGFR values <6055min/1.73 m2 may be an indication of chronic kidney disease (CKD). Calculated eGFR, using the MRDR Study equation, is useful in  patients with stable renal function. The eGFR calculation will not be reliable in acutely ill patients when serum creatinine is changing rapidly. It is not useful in patients on dialysis. The eGFR calculation may not be applicable to patients at the low and high extremes of body sizes, pregnant women, and vegetarians.   09/08/2013 56 (L)  Final    Comment:    eGFR  values <59m/min/1.73 m2 may be an indication of chronic kidney disease (CKD). Calculated eGFR is useful in patients with stable renal function. The eGFR calculation will not be reliable in acutely ill patients when serum creatinine is changing rapidly. It is not useful in  patients on dialysis. The eGFR calculation may not be applicable to patients at the low and high extremes of body sizes, pregnant women, and vegetarians.    GFR, Estimated  Date Value Ref Range Status  11/26/2020 >60 >60 mL/min Final    Comment:    (NOTE) Calculated using the CKD-EPI Creatinine Equation (2021)          Passed - Patient is not pregnant      Passed - Valid encounter within last 12 months    Recent Outpatient Visits           [facility]   8 months [facility]   1 year ago No-show for appointment   [facility]       Future Appointments              In 3 months [facility]rol Banana, MD [facility]

## 2021-12-28 MED ORDER — MELOXICAM 15 MG PO TABS: 15.0000 mg | ORAL_TABLET | Freq: Every day | ORAL | 3 refills | Status: DC

## 2022-03-16 ENCOUNTER — Other Ambulatory Visit: Payer: Self-pay | Admitting: Family Medicine

## 2022-03-16 DIAGNOSIS — I10 Essential (primary) hypertension: Secondary | ICD-10-CM

## 2022-04-20 ENCOUNTER — Ambulatory Visit (INDEPENDENT_AMBULATORY_CARE_PROVIDER_SITE_OTHER): Payer: Self-pay | Admitting: Family Medicine

## 2022-04-20 ENCOUNTER — Encounter: Payer: Self-pay | Admitting: Family Medicine

## 2022-04-20 VITALS — BP 170/92 | HR 84 | Resp 16 | Wt 171.0 lb

## 2022-04-20 DIAGNOSIS — I10 Essential (primary) hypertension: Secondary | ICD-10-CM

## 2022-04-20 DIAGNOSIS — M542 Cervicalgia: Secondary | ICD-10-CM

## 2022-04-20 DIAGNOSIS — R21 Rash and other nonspecific skin eruption: Secondary | ICD-10-CM

## 2022-04-20 MED ORDER — MOMETASONE FUROATE 0.1 % EX CREA: TOPICAL_CREAM | Freq: Every day | CUTANEOUS | 1 refills | Status: DC

## 2022-04-20 MED ORDER — TRIAMTERENE-HCTZ 37.5-25 MG PO CAPS: 1.0000 | ORAL_CAPSULE | Freq: Every day | ORAL | 5 refills | Status: DC

## 2022-04-20 NOTE — Progress Notes (Signed)
Established patient visit   Patient: Rachael Aguilar   DOB: 08/17/1965   56 y.o. Female  MRN: [id number] Visit Date: 04/20/2022  Today's healthcare provider: Wilhemena Durie, MD   Chief Complaint  Patient presents with   Follow-up   Hypertension   Subjective    HPI  Patient is here for follow up bp.  She is taking and tolerating amlodipine.  Her neck is much better. She does have a somewhat pruritic rash at the top of both thighs just under the buttocks bilaterally.   Medications: Outpatient Medications Prior to Visit  Medication Sig   amLODipine (NORVASC) 5 MG tablet Take 1 tablet by mouth once daily   cyclobenzaprine (FLEXERIL) 10 MG tablet Take 1 tablet (10 mg total) by mouth 3 (three) times daily as needed for muscle spasms.   meloxicam (MOBIC) 15 MG tablet Take 1 tablet (15 mg total) by mouth daily.   lisinopril-hydrochlorothiazide (ZESTORETIC) 10-12.5 MG tablet Take 1 tablet by mouth daily. (Patient not taking: Reported on 12/23/2021)   pantoprazole (PROTONIX) 40 MG tablet Take 1 tablet (40 mg total) by mouth daily. (Patient not taking: Reported on 12/23/2021)   No facility-administered medications prior to visit.    Review of Systems  Last metabolic panel Lab Results  Component Value Date   GLUCOSE 134 (H) 11/26/2020   NA 137 11/26/2020   K 3.3 (L) 11/26/2020   CL 101 11/26/2020   CO2 27 11/26/2020   BUN 13 11/26/2020   CREATININE 0.83 11/26/2020   GFRNONAA >60 11/26/2020   CALCIUM 9.0 11/26/2020   PHOS 2.9 11/11/2020   PROT 6.8 11/26/2020   ALBUMIN 3.0 (L) 11/26/2020   BILITOT 0.4 11/26/2020   ALKPHOS 76 11/26/2020   AST 17 11/26/2020   ALT 22 11/26/2020   ANIONGAP 9 11/26/2020       Objective    BP (!) 170/92 (BP Location: Right Arm, Patient Position: Sitting, Cuff Size: Normal)   Pulse 84   Resp 16   Wt 171 lb (77.6 kg)   SpO2 98%   BMI 29.35 kg/m  BP Readings from Last 3 Encounters:  04/20/22 (!) 170/92  12/23/21 (!) 177/104   04/21/21 (!) 158/82   Wt Readings from Last 3 Encounters:  04/20/22 171 lb (77.6 kg)  12/23/21 160 lb (72.6 kg)  04/21/21 154 lb (69.9 kg)      Physical Exam Vitals reviewed.  Constitutional:      General: She is not in acute distress.    Appearance: She is well-developed.  HENT:     Head: Normocephalic and atraumatic.     Right Ear: Hearing normal.     Left Ear: Hearing normal.     Nose: Nose normal.  Eyes:     General: Lids are normal. No scleral icterus.       Right eye: No discharge.        Left eye: No discharge.     Conjunctiva/sclera: Conjunctivae normal.  Cardiovascular:     Rate and Rhythm: Normal rate and regular rhythm.     Heart sounds: Normal heart sounds.  Pulmonary:     Effort: Pulmonary effort is normal. No respiratory distress.  Skin:    General: Skin is warm and dry.     Findings: No lesion or rash.     Comments: There is a patch of pigmented dermatitis posterior thighs.  Out the size of an apple.  No drainage or sign of secondary infection.  First of  all this had the appearance of molluscum.  Neurological:     General: No focal deficit present.     Mental Status: She is alert and oriented to person, place, and time.  Psychiatric:        Mood and Affect: Mood normal.        Speech: Speech normal.        Behavior: Behavior normal.        Thought Content: Thought content normal.        Judgment: Judgment normal.       No results found for any visits on 04/20/22.  Assessment & Plan     1. Primary hypertension Dyazide to her amlodipine.  Follow-up in 2 to 3 months and repeat renal panel in - triamterene-hydrochlorothiazide (DYAZIDE) 37.5-25 MG capsule; Take 1 each (1 capsule total) by mouth daily.  Dispense: 30 capsule; Refill: 5  2. Rash Unspecified dermatitis at this time.  Treat with mometasone cream. - mometasone (ELOCON) 0.1 % cream; Apply topically daily. Apply Daily for rash  Dispense: 15 g; Refill: 1  3. Neck pain Much improved,  basically resolved.   No follow-ups on file.      I, Wilhemena Durie, MD, have reviewed all documentation for this visit. The documentation on 04/24/22 for the exam, diagnosis, procedures, and orders are all accurate and complete.    Aira Sallade Cranford Mon, MD  [facility] [phone] (phone) [phone] (fax)  [place]

## 2022-05-25 NOTE — Progress Notes (Deleted)
I,Margery Szostak S Zareah Hunzeker,acting as a Education administrator for [employer], MD.,have documented all relevant documentation on the behalf of Eulis Foster, MD,as directed by  Eulis Foster, MD while in the presence of Eulis Foster, MD.     Established patient visit   Patient: Rachael Aguilar   DOB: 1966/03/02   56 y.o. Female  MRN: [id number] Visit Date: 05/26/2022  Today's healthcare provider: Eulis Foster, MD   No chief complaint on file.  Subjective    HPI  Hypertension, follow-up  BP Readings from Last 3 Encounters:  04/20/22 (!) 170/92  12/23/21 (!) 177/104  04/21/21 (!) 158/82   Wt Readings from Last 3 Encounters:  04/20/22 171 lb (77.6 kg)  12/23/21 160 lb (72.6 kg)  04/21/21 154 lb (69.9 kg)     She was last seen for hypertension 1 months ago.  BP at that visit was 170/92. Management since that visit includes add tranterene-hctz 37.5-'25mg'$  to amlodipine.  She reports {excellent/good/fair/poor:19665} compliance with treatment. She {is/is not:9024} having side effects. {document side effects if present:1} She is following a {diet:21022986} diet. She {is/is not:9024} exercising. She does not smoke.  Use of agents associated with hypertension: none.   Outside blood pressures are {***enter patient reported home BP readings, or 'not being checked':1}. Symptoms: {Yes/No:20286} chest pain {Yes/No:20286} chest pressure  {Yes/No:20286} palpitations {Yes/No:20286} syncope  {Yes/No:20286} dyspnea {Yes/No:20286} orthopnea  {Yes/No:20286} paroxysmal nocturnal dyspnea {Yes/No:20286} lower extremity edema   Pertinent labs Lab Results  Component Value Date   CHOL 242 (A) 12/26/2014   HDL 46 12/26/2014   LDLCALC 180 12/26/2014   TRIG 82 12/26/2014   Lab Results  Component Value Date   NA 137 11/26/2020   K 3.3 (L) 11/26/2020   CREATININE 0.83 11/26/2020   GFRNONAA >60 11/26/2020   GLUCOSE 134 (H) 11/26/2020   TSH 0.86 12/26/2014      The ASCVD Risk score (Arnett DK, et al., 2019) failed to calculate for the following reasons:   Cannot find a previous HDL lab   Cannot find a previous total cholesterol lab  ---------------------------------------------------------------------------------------------------   Medications: Outpatient Medications Prior to Visit  Medication Sig   amLODipine (NORVASC) 5 MG tablet Take 1 tablet by mouth once daily   cyclobenzaprine (FLEXERIL) 10 MG tablet Take 1 tablet (10 mg total) by mouth 3 (three) times daily as needed for muscle spasms.   lisinopril-hydrochlorothiazide (ZESTORETIC) 10-12.5 MG tablet Take 1 tablet by mouth daily. (Patient not taking: Reported on 12/23/2021)   meloxicam (MOBIC) 15 MG tablet Take 1 tablet (15 mg total) by mouth daily.   mometasone (ELOCON) 0.1 % cream Apply topically daily. Apply Daily for rash   pantoprazole (PROTONIX) 40 MG tablet Take 1 tablet (40 mg total) by mouth daily. (Patient not taking: Reported on 12/23/2021)   triamterene-hydrochlorothiazide (DYAZIDE) 37.5-25 MG capsule Take 1 each (1 capsule total) by mouth daily.   No facility-administered medications prior to visit.    Review of Systems  {Labs  Heme  Chem  Endocrine  Serology  Results Review (optional):23779}   Objective    There were no vitals taken for this visit. BP Readings from Last 3 Encounters:  04/20/22 (!) 170/92  12/23/21 (!) 177/104  04/21/21 (!) 158/82   Wt Readings from Last 3 Encounters:  04/20/22 171 lb (77.6 kg)  12/23/21 160 lb (72.6 kg)  04/21/21 154 lb (69.9 kg)    Physical Exam  ***  No results found for any visits on 05/26/22.  Assessment & Plan     ***  No follow-ups on file.      {provider attestation***:1}   Eulis Foster, MD  [facility] [phone] (phone) [phone] (fax)  [place]

## 2022-05-26 ENCOUNTER — Ambulatory Visit: Payer: Self-pay | Admitting: Family Medicine

## 2022-05-26 DIAGNOSIS — I10 Essential (primary) hypertension: Secondary | ICD-10-CM

## 2022-06-08 ENCOUNTER — Ambulatory Visit: Payer: Self-pay | Admitting: Family Medicine

## 2022-06-08 NOTE — Progress Notes (Deleted)
I,Pansy Ostrovsky E Vela Render,acting as a scribe for [employer], MD.,have documented all relevant documentation on the behalf of Eulis Foster, MD,as directed by  Eulis Foster, MD while in the presence of Eulis Foster, MD.   Established patient visit   Patient: Rachael Aguilar   DOB: [birth year]   56 y.o. Female  MRN: [id number] Visit Date: 06/08/2022  Today's healthcare provider: Eulis Foster, MD   No chief complaint on file.  Subjective    HPI  Hypertension, follow-up  BP Readings from Last 3 Encounters:  04/20/22 (!) 170/92  12/23/21 (!) 177/104  04/21/21 (!) 158/82   Wt Readings from Last 3 Encounters:  04/20/22 171 lb (77.6 kg)  12/23/21 160 lb (72.6 kg)  04/21/21 154 lb (69.9 kg)     She was last seen for hypertension 2-3 months ago.  BP at that visit was 170/92. Management since that visit includes triamterene-hydrochlorothiazide (DYAZIDE) 37.5-25 MG capsule and amlodipine '5mg'$  daily.  She reports {excellent/good/fair/poor:19665} compliance with treatment. She {is/is not:9024} having side effects. {document side effects if present:1} She is following a {diet:21022986} diet. She {is/is not:9024} exercising. She does not smoke.  Outside blood pressures are {***enter patient reported home BP readings, or 'not being checked':1}. Of note, she had mild hypokalemia in 2022 at 3.3 Creatinine was 0.83 at that time   Symptoms: {Yes/No:20286} chest pain {Yes/No:20286} chest pressure  {Yes/No:20286} palpitations {Yes/No:20286} syncope  {Yes/No:20286} dyspnea {Yes/No:20286} orthopnea  {Yes/No:20286} paroxysmal nocturnal dyspnea {Yes/No:20286} lower extremity edema   Pertinent labs Lab Results  Component Value Date   CHOL 242 (A) 12/26/2014   HDL 46 12/26/2014   LDLCALC 180 12/26/2014   TRIG 82 12/26/2014   Lab Results  Component Value Date   NA 137 11/26/2020   K 3.3 (L) 11/26/2020   CREATININE 0.83 11/26/2020    GFRNONAA >60 11/26/2020   GLUCOSE 134 (H) 11/26/2020   TSH 0.86 12/26/2014     The ASCVD Risk score (Arnett DK, et al., 2019) failed to calculate for the following reasons:   Cannot find a previous HDL lab   Cannot find a previous total cholesterol lab  ---------------------------------------------------------------------------------------------------   Medications: Outpatient Medications Prior to Visit  Medication Sig   amLODipine (NORVASC) 5 MG tablet Take 1 tablet by mouth once daily   cyclobenzaprine (FLEXERIL) 10 MG tablet Take 1 tablet (10 mg total) by mouth 3 (three) times daily as needed for muscle spasms.   lisinopril-hydrochlorothiazide (ZESTORETIC) 10-12.5 MG tablet Take 1 tablet by mouth daily. (Patient not taking: Reported on 12/23/2021)   meloxicam (MOBIC) 15 MG tablet Take 1 tablet (15 mg total) by mouth daily.   mometasone (ELOCON) 0.1 % cream Apply topically daily. Apply Daily for rash   pantoprazole (PROTONIX) 40 MG tablet Take 1 tablet (40 mg total) by mouth daily. (Patient not taking: Reported on 12/23/2021)   triamterene-hydrochlorothiazide (DYAZIDE) 37.5-25 MG capsule Take 1 each (1 capsule total) by mouth daily.   No facility-administered medications prior to visit.    Review of Systems  {Labs  Heme  Chem  Endocrine  Serology  Results Review (optional):23779}   Objective    There were no vitals taken for this visit. {Show previous vital signs (optional):23777}  Physical Exam Vitals reviewed.  Constitutional:      General: She is not in acute distress.    Appearance: Normal appearance. She is not ill-appearing, toxic-appearing or diaphoretic.  Eyes:     Conjunctiva/sclera: Conjunctivae normal.  Cardiovascular:     Rate and  Rhythm: Normal rate and regular rhythm.     Pulses: Normal pulses.     Heart sounds: Normal heart sounds. No murmur heard.    No friction rub. No gallop.  Pulmonary:     Effort: Pulmonary effort is normal. No respiratory  distress.     Breath sounds: Normal breath sounds. No stridor. No wheezing, rhonchi or rales.  Abdominal:     General: Bowel sounds are normal. There is no distension.     Palpations: Abdomen is soft.     Tenderness: There is no abdominal tenderness.  Musculoskeletal:     Right lower leg: No edema.     Left lower leg: No edema.  Skin:    Findings: No erythema or rash.  Neurological:     Mental Status: She is alert and oriented to person, place, and time.      ***  No results found for any visits on 06/08/22.  Assessment & Plan     Problem List Items Addressed This Visit   None    No follow-ups on file.      I, Eulis Foster, MD, have reviewed all documentation for this visit. The documentation on 06/08/22 for the exam, diagnosis, procedures, and orders are all accurate and complete.  Portions of this information were initially documented by the CMA and reviewed by me for thoroughness and accuracy.      Eulis Foster, MD  [facility] [phone] (phone) [phone] (fax)  [place]

## 2022-07-30 ENCOUNTER — Emergency Department
Admission: EM | Admit: 2022-07-30 | Discharge: 2022-07-30 | Disposition: A | Discharge status: Home / Self Care | Payer: 59 | Attending: Emergency Medicine | Admitting: Emergency Medicine

## 2022-07-30 ENCOUNTER — Other Ambulatory Visit: Payer: Self-pay

## 2022-07-30 DIAGNOSIS — K047 Periapical abscess without sinus: Secondary | ICD-10-CM | POA: Insufficient documentation

## 2022-07-30 DIAGNOSIS — I10 Essential (primary) hypertension: Secondary | ICD-10-CM | POA: Diagnosis not present

## 2022-07-30 DIAGNOSIS — K029 Dental caries, unspecified: Secondary | ICD-10-CM | POA: Diagnosis not present

## 2022-07-30 DIAGNOSIS — R220 Localized swelling, mass and lump, head: Secondary | ICD-10-CM | POA: Diagnosis not present

## 2022-07-30 LAB — CBC WITH DIFFERENTIAL/PLATELET
Abs Immature Granulocytes: 0.02 10*3/uL (ref 0.00–0.07)
Basophils Absolute: 0 10*3/uL (ref 0.0–0.1)
Basophils Relative: 0 %
Eosinophils Absolute: 0 10*3/uL (ref 0.0–0.5)
Eosinophils Relative: 0 %
HCT: 44 % (ref 36.0–46.0)
Hemoglobin: 14.7 g/dL (ref 12.0–15.0)
Immature Granulocytes: 0 %
Lymphocytes Relative: 23 %
Lymphs Abs: 2.4 10*3/uL (ref 0.7–4.0)
MCH: 30.6 pg (ref 26.0–34.0)
MCHC: 33.4 g/dL (ref 30.0–36.0)
MCV: 91.5 fL (ref 80.0–100.0)
Monocytes Absolute: 0.8 10*3/uL (ref 0.1–1.0)
Monocytes Relative: 7 %
Neutro Abs: 7.2 10*3/uL (ref 1.7–7.7)
Neutrophils Relative %: 70 %
Platelets: 312 10*3/uL (ref 150–400)
RBC: 4.81 MIL/uL (ref 3.87–5.11)
RDW: 14.6 % (ref 11.5–15.5)
WBC: 10.5 10*3/uL (ref 4.0–10.5)
nRBC: 0 % (ref 0.0–0.2)

## 2022-07-30 LAB — BASIC METABOLIC PANEL
Anion gap: 9 (ref 5–15)
BUN: 10 mg/dL (ref 6–20)
CO2: 26 mmol/L (ref 22–32)
Calcium: 9.3 mg/dL (ref 8.9–10.3)
Chloride: 105 mmol/L (ref 98–111)
Creatinine, Ser: 0.79 mg/dL (ref 0.44–1.00)
GFR, Estimated: >60 mL/min (ref >60)
Glucose, Bld: 129 mg/dL — ABNORMAL HIGH (ref 70–99)
Potassium: 3.2 mmol/L — ABNORMAL LOW (ref 3.5–5.1)
Sodium: 140 mmol/L (ref 135–145)

## 2022-07-30 MED ORDER — IBUPROFEN 400 MG PO TABS
400.0000 mg | ORAL_TABLET | Freq: Once | ORAL | Status: AC
  Administered 2022-07-30: 400 mg via ORAL
  Filled 2022-07-30: qty 1

## 2022-07-30 MED ORDER — LIDOCAINE-EPINEPHRINE 2 %-1:100000 IJ SOLN
20.0000 mL | Freq: Once | INTRAMUSCULAR | Status: AC
  Administered 2022-07-30: 20 mL
  Filled 2022-07-30: qty 1

## 2022-07-30 MED ORDER — LIDOCAINE VISCOUS HCL 2 % MT SOLN
15.0000 mL | Freq: Once | OROMUCOSAL | Status: AC
  Administered 2022-07-30: 15 mL via OROMUCOSAL
  Filled 2022-07-30: qty 15

## 2022-07-30 MED ORDER — POTASSIUM CHLORIDE CRYS ER 20 MEQ PO TBCR
40.0000 meq | EXTENDED_RELEASE_TABLET | Freq: Once | ORAL | Status: AC
  Administered 2022-07-30: 40 meq via ORAL
  Filled 2022-07-30: qty 2

## 2022-07-30 MED ORDER — AMOXICILLIN-POT CLAVULANATE 875-125 MG PO TABS
1.0000 | ORAL_TABLET | Freq: Once | ORAL | Status: AC
  Administered 2022-07-30: 1 via ORAL
  Filled 2022-07-30: qty 1

## 2022-07-30 MED ORDER — AMOXICILLIN-POT CLAVULANATE 875-125 MG PO TABS: 1.0000 | ORAL_TABLET | Freq: Two times a day (BID) | ORAL | 0 refills | Status: AC

## 2022-07-30 NOTE — Discharge Instructions (Addendum)
Please take the antibiotic twice a day for the next 7 days.'s very important that you see a dentist.  If your facial swelling is worsening or having difficulty talking or swallowing please return to the emergency department.  You can take Tylenol Motrin for pain

## 2022-07-30 NOTE — ED Triage Notes (Signed)
Reports top right molar has been bothering her for 3-4 days and it started swelling yesterday patient now has significant swelling to right side of face and right eye.

## 2022-07-30 NOTE — ED Provider Notes (Signed)
[facility] Provider Note    Event Date/Time   First MD Initiated Contact with Patient 07/30/22 1133     (approximate)   History   Facial Swelling and Dental Problem   HPI  Rachael Aguilar is a 56 y.o. female past medical history of hypertension and bipolar disorder who presents with tooth pain.  Symptoms been going on for several days.  Patient endorses pain in one of the right upper molars.  She has developed swelling the right side of her face up to her eye as well denies fevers chills denies difficulty swallowing still tolerating p.o.  Does not have a dentist.     Past Medical History:  Diagnosis Date   Bipolar depression ([place])    Hypertension    Lower GI bleed     Patient Active Problem List   Diagnosis Date Noted   Diverticulosis 11/19/2020   Hemorrhagic shock ([place]) 11/19/2020   Acute GI bleeding 11/09/2020   Acute cholecystitis 04/01/2016   RUQ pain    Anxiety and depression 01/14/2015   Gastro-esophageal reflux disease without esophagitis 01/14/2015   BP (high blood pressure) 01/14/2015     Physical Exam  Triage Vital Signs: ED Triage Vitals [07/30/22 1054]  Enc Vitals Group     BP (!) 189/116     Pulse Rate (!) 119     Resp 18     Temp 99.5 F (37.5 C)     Temp Source Oral     SpO2 97 %     Weight 171 lb (77.6 kg)     Height '5\' 4"'$  (1.626 m)     Head Circumference      Peak Flow      Pain Score 10     Pain Loc      Pain Edu?      Excl. in [place]?     Most recent vital signs: Vitals:   07/30/22 1054 07/30/22 1217  BP: (!) 189/116   Pulse: (!) 119 99  Resp: 18   Temp: 99.5 F (37.5 C)   SpO2: 97%      General: Awake, no distress.  CV:  Good peripheral perfusion.  Resp:  Normal effort.  Abd:  No distention.  Neuro:             Awake, Alert, Oriented x 3  Other:  Significant swelling noted of the right cheek up to the right eye, extraocular movements are intact No significant submandibular swelling, no  trismus Patient has poor dentition, tenderness to percussion the right premolar and molar, the latter of which has significant dental caries and is mostly missing Tenderness and swelling in the buccal space above the first and second premolar with out clear fluctuance No swelling under the tongue or elevation of the floor the mouth   ED Results / Procedures / Treatments  Labs (all labs ordered are listed, but only abnormal results are displayed) Labs Reviewed  BASIC METABOLIC PANEL - Abnormal; Notable for the following components:      Result Value   Potassium 3.2 (*)    Glucose, Bld 129 (*)    All other components within normal limits  CBC WITH DIFFERENTIAL/PLATELET     EKG     RADIOLOGY    PROCEDURES:  Critical Care performed: No  ..Incision and Drainage  Date/Time: 07/30/2022 12:20 PM  Performed by: Rada Hay, MD Authorized by: Rada Hay, MD   Consent:    Consent obtained:  Verbal  Risks discussed:  Bleeding, incomplete drainage and pain Universal protocol:    Patient identity confirmed:  Verbally with patient Location:    Type:  Abscess   Location:  Mouth   Mouth location:  Alveolar process Sedation:    Sedation type:  None Anesthesia:    Anesthesia method:  Local infiltration   Local anesthetic:  Lidocaine 2% WITH epi Procedure type:    Complexity:  Simple Procedure details:    Incision types:  Stab incision   Incision depth:  Submucosal   Drainage:  Bloody   Drainage amount:  Scant   Wound treatment:  Wound left open   Packing materials:  None Post-procedure details:    Procedure completion:  Tolerated    MEDICATIONS ORDERED IN ED: Medications  amoxicillin-clavulanate (AUGMENTIN) 875-125 MG per tablet 1 tablet (1 tablet Oral Given 07/30/22 1158)  lidocaine (XYLOCAINE) 2 % viscous mouth solution 15 mL (15 mLs Mouth/Throat Given 07/30/22 1158)  lidocaine-EPINEPHrine (XYLOCAINE W/EPI) 2 %-1:100000 (with pres) injection 20 mL  (20 mLs Infiltration Given 07/30/22 1159)  ibuprofen (ADVIL) tablet 400 mg (400 mg Oral Given 07/30/22 1158)  potassium chloride SA (KLOR-CON M) CR tablet 40 mEq (40 mEq Oral Given 07/30/22 1200)     IMPRESSION / MDM / ASSESSMENT AND PLAN / ED COURSE  I reviewed the triage vital signs and the nursing notes.                              Patient's presentation is most consistent with acute, uncomplicated illness.  Differential diagnosis includes, but is not limited to, dental abscess, exam not consistent with Ludwig's angina, less likely maxillary osteomyelitis  Patient is a 56 year old female who presents with tooth pain and facial swelling.  Symptoms been going on for several days.  She is hypertensive and tachycardic on arrival temp 99 5.  She does have significant right face swelling over the maxillary region but nothing submandibular she has no trismus no elevation of the floor the mouth.  She is tender to percussion on the right maxillary premolar and there is some swelling in the buccal space not clear fluctuance but I do think there is likely an abscess there.  Discussed I&D versus antibiotics alone with knowledge that I&D may not be successful patient did agree to try I&D.  Will give first dose of Augmentin.  Attempted I&D but there was no significant drainage or purulence.  Discussed the importance of dental follow-up.  Prescribed 7 days of Augmentin.      FINAL CLINICAL IMPRESSION(S) / ED DIAGNOSES   Final diagnoses:  None     Rx / DC Orders   ED Discharge Orders          Ordered    amoxicillin-clavulanate (AUGMENTIN) 875-125 MG tablet  2 times daily        07/30/22 1218             Note:  This document was prepared using Dragon voice recognition software and may include unintentional dictation errors.   Rada Hay, MD 07/30/22 [phone]

## 2023-05-28 ENCOUNTER — Other Ambulatory Visit: Payer: Self-pay

## 2023-05-28 ENCOUNTER — Emergency Department: Payer: Self-pay

## 2023-05-28 ENCOUNTER — Observation Stay: Payer: Self-pay

## 2023-05-28 ENCOUNTER — Inpatient Hospital Stay
Admission: EM | Admit: 2023-05-28 | Discharge: 2023-05-31 | DRG: 064 | Disposition: A | Discharge status: Rehabilitation Facility | Payer: Self-pay | Attending: Internal Medicine | Admitting: Internal Medicine

## 2023-05-28 ENCOUNTER — Encounter: Payer: Self-pay | Admitting: Radiology

## 2023-05-28 DIAGNOSIS — K5730 Diverticulosis of large intestine without perforation or abscess without bleeding: Secondary | ICD-10-CM | POA: Diagnosis present

## 2023-05-28 DIAGNOSIS — K219 Gastro-esophageal reflux disease without esophagitis: Secondary | ICD-10-CM | POA: Diagnosis present

## 2023-05-28 DIAGNOSIS — I6329 Cerebral infarction due to unspecified occlusion or stenosis of other precerebral arteries: Secondary | ICD-10-CM | POA: Diagnosis present

## 2023-05-28 DIAGNOSIS — Z8261 Family history of arthritis: Secondary | ICD-10-CM

## 2023-05-28 DIAGNOSIS — Z791 Long term (current) use of non-steroidal anti-inflammatories (NSAID): Secondary | ICD-10-CM

## 2023-05-28 DIAGNOSIS — R296 Repeated falls: Secondary | ICD-10-CM | POA: Diagnosis present

## 2023-05-28 DIAGNOSIS — Z79899 Other long term (current) drug therapy: Secondary | ICD-10-CM

## 2023-05-28 DIAGNOSIS — Z8249 Family history of ischemic heart disease and other diseases of the circulatory system: Secondary | ICD-10-CM

## 2023-05-28 DIAGNOSIS — Z7902 Long term (current) use of antithrombotics/antiplatelets: Secondary | ICD-10-CM

## 2023-05-28 DIAGNOSIS — R4781 Slurred speech: Secondary | ICD-10-CM | POA: Diagnosis present

## 2023-05-28 DIAGNOSIS — F32A Depression, unspecified: Secondary | ICD-10-CM | POA: Diagnosis present

## 2023-05-28 DIAGNOSIS — Z634 Disappearance and death of family member: Secondary | ICD-10-CM

## 2023-05-28 DIAGNOSIS — Z8489 Family history of other specified conditions: Secondary | ICD-10-CM

## 2023-05-28 DIAGNOSIS — Z7982 Long term (current) use of aspirin: Secondary | ICD-10-CM

## 2023-05-28 DIAGNOSIS — I10 Essential (primary) hypertension: Secondary | ICD-10-CM | POA: Diagnosis present

## 2023-05-28 DIAGNOSIS — Z8041 Family history of malignant neoplasm of ovary: Secondary | ICD-10-CM

## 2023-05-28 DIAGNOSIS — E119 Type 2 diabetes mellitus without complications: Secondary | ICD-10-CM | POA: Diagnosis present

## 2023-05-28 DIAGNOSIS — G8191 Hemiplegia, unspecified affecting right dominant side: Secondary | ICD-10-CM | POA: Diagnosis present

## 2023-05-28 DIAGNOSIS — K5790 Diverticulosis of intestine, part unspecified, without perforation or abscess without bleeding: Secondary | ICD-10-CM | POA: Diagnosis present

## 2023-05-28 DIAGNOSIS — I639 Cerebral infarction, unspecified: Principal | ICD-10-CM | POA: Diagnosis present

## 2023-05-28 DIAGNOSIS — Z808 Family history of malignant neoplasm of other organs or systems: Secondary | ICD-10-CM

## 2023-05-28 DIAGNOSIS — Z8673 Personal history of transient ischemic attack (TIA), and cerebral infarction without residual deficits: Secondary | ICD-10-CM

## 2023-05-28 DIAGNOSIS — R29704 NIHSS score 4: Secondary | ICD-10-CM | POA: Diagnosis present

## 2023-05-28 DIAGNOSIS — F4321 Adjustment disorder with depressed mood: Secondary | ICD-10-CM | POA: Diagnosis present

## 2023-05-28 DIAGNOSIS — E876 Hypokalemia: Secondary | ICD-10-CM | POA: Diagnosis present

## 2023-05-28 DIAGNOSIS — R2990 Unspecified symptoms and signs involving the nervous system: Secondary | ICD-10-CM | POA: Diagnosis present

## 2023-05-28 DIAGNOSIS — I6381 Other cerebral infarction due to occlusion or stenosis of small artery: Principal | ICD-10-CM | POA: Diagnosis present

## 2023-05-28 LAB — CBC
HCT: 43.6 % (ref 36.0–46.0)
Hemoglobin: 14.8 g/dL (ref 12.0–15.0)
MCH: 30.8 pg (ref 26.0–34.0)
MCHC: 33.9 g/dL (ref 30.0–36.0)
MCV: 90.8 fL (ref 80.0–100.0)
Platelets: 296 10*3/uL (ref 150–400)
RBC: 4.8 MIL/uL (ref 3.87–5.11)
RDW: 14.3 % (ref 11.5–15.5)
WBC: 6.1 10*3/uL (ref 4.0–10.5)
nRBC: 0 % (ref 0.0–0.2)

## 2023-05-28 LAB — COMPREHENSIVE METABOLIC PANEL
ALT: 24 U/L (ref 0–44)
AST: 24 U/L (ref 15–41)
Albumin: 4.3 g/dL (ref 3.5–5.0)
Alkaline Phosphatase: 87 U/L (ref 38–126)
Anion gap: 12 (ref 5–15)
BUN: 10 mg/dL (ref 6–20)
CO2: 26 mmol/L (ref 22–32)
Calcium: 9.2 mg/dL (ref 8.9–10.3)
Chloride: 102 mmol/L (ref 98–111)
Creatinine, Ser: 0.88 mg/dL (ref 0.44–1.00)
GFR, Estimated: >60 mL/min (ref >60)
Glucose, Bld: 101 mg/dL — ABNORMAL HIGH (ref 70–99)
Potassium: 3 mmol/L — ABNORMAL LOW (ref 3.5–5.1)
Sodium: 140 mmol/L (ref 135–145)
Total Bilirubin: 1.2 mg/dL (ref 0.3–1.2)
Total Protein: 7.8 g/dL (ref 6.5–8.1)

## 2023-05-28 LAB — DIFFERENTIAL
Abs Immature Granulocytes: 0.01 10*3/uL (ref 0.00–0.07)
Basophils Absolute: 0 10*3/uL (ref 0.0–0.1)
Basophils Relative: 1 %
Eosinophils Absolute: 0 10*3/uL (ref 0.0–0.5)
Eosinophils Relative: 1 %
Immature Granulocytes: 0 %
Lymphocytes Relative: 28 %
Lymphs Abs: 1.7 10*3/uL (ref 0.7–4.0)
Monocytes Absolute: 0.5 10*3/uL (ref 0.1–1.0)
Monocytes Relative: 8 %
Neutro Abs: 3.8 10*3/uL (ref 1.7–7.7)
Neutrophils Relative %: 62 %

## 2023-05-28 LAB — CK: Total CK: 285 U/L — ABNORMAL HIGH (ref 38–234)

## 2023-05-28 LAB — APTT: aPTT: 33 s (ref 24–36)

## 2023-05-28 LAB — MAGNESIUM: Magnesium: 1.9 mg/dL (ref 1.7–2.4)

## 2023-05-28 LAB — ETHANOL: Alcohol, Ethyl (B): <10 mg/dL (ref <10)

## 2023-05-28 LAB — PROTIME-INR
INR: 1 (ref 0.8–1.2)
Prothrombin Time: 13.4 s (ref 11.4–15.2)

## 2023-05-28 MED ORDER — POTASSIUM CHLORIDE CRYS ER 20 MEQ PO TBCR
40.0000 meq | EXTENDED_RELEASE_TABLET | Freq: Once | ORAL | Status: AC
  Administered 2023-05-28: 40 meq via ORAL
  Filled 2023-05-28: qty 2

## 2023-05-28 MED ORDER — SODIUM CHLORIDE 0.9% FLUSH: 3.0000 mL | Freq: Once | INTRAVENOUS | Status: DC

## 2023-05-28 MED ORDER — TRIAMTERENE-HCTZ 37.5-25 MG PO CAPS: 1.0000 | ORAL_CAPSULE | Freq: Every day | ORAL | Status: DC

## 2023-05-28 MED ORDER — IOHEXOL 350 MG/ML SOLN
75.0000 mL | Freq: Once | INTRAVENOUS | Status: AC | PRN
  Administered 2023-05-28: 75 mL via INTRAVENOUS

## 2023-05-28 MED ORDER — CYCLOBENZAPRINE HCL 10 MG PO TABS
10.0000 mg | ORAL_TABLET | Freq: Three times a day (TID) | ORAL | Status: DC | PRN
  Administered 2023-05-29: 10 mg via ORAL
  Filled 2023-05-28: qty 1

## 2023-05-28 MED ORDER — HYDRALAZINE HCL 20 MG/ML IJ SOLN
5.0000 mg | Freq: Four times a day (QID) | INTRAMUSCULAR | Status: DC | PRN
Start: 2023-05-28 — End: 2023-06-01
  Administered 2023-05-28: 5 mg via INTRAVENOUS
  Filled 2023-05-28: qty 1

## 2023-05-28 MED ORDER — AMLODIPINE BESYLATE 5 MG PO TABS
5.0000 mg | ORAL_TABLET | Freq: Every day | ORAL | Status: DC
  Administered 2023-05-28: 5 mg via ORAL
  Filled 2023-05-28: qty 1

## 2023-05-28 MED ORDER — SENNOSIDES-DOCUSATE SODIUM 8.6-50 MG PO TABS: 1.0000 | ORAL_TABLET | Freq: Every evening | ORAL | Status: DC | PRN

## 2023-05-28 MED ORDER — ACETAMINOPHEN 325 MG PO TABS
650.0000 mg | ORAL_TABLET | Freq: Four times a day (QID) | ORAL | Status: DC | PRN
Start: 2023-05-28 — End: 2023-06-04
  Administered 2023-05-29: 650 mg via ORAL
  Filled 2023-05-28 (×2): qty 2

## 2023-05-28 MED ORDER — ASPIRIN 81 MG PO TBEC
81.0000 mg | DELAYED_RELEASE_TABLET | Freq: Every day | ORAL | Status: DC
  Administered 2023-05-29 – 2023-05-31 (×3): 81 mg via ORAL
  Filled 2023-05-28 (×3): qty 1

## 2023-05-28 MED ORDER — ACETAMINOPHEN 650 MG RE SUPP: 650.0000 mg | Freq: Four times a day (QID) | RECTAL | Status: DC | PRN

## 2023-05-28 MED ORDER — ONDANSETRON HCL 4 MG PO TABS: 4.0000 mg | ORAL_TABLET | Freq: Four times a day (QID) | ORAL | Status: DC | PRN

## 2023-05-28 MED ORDER — POTASSIUM CITRATE-CITRIC ACID 1100-334 MG/5ML PO SOLN
10.0000 meq | Freq: Three times a day (TID) | ORAL | Status: AC
  Filled 2023-05-28 (×2): qty 5

## 2023-05-28 MED ORDER — ATORVASTATIN CALCIUM 20 MG PO TABS
40.0000 mg | ORAL_TABLET | Freq: Every day | ORAL | Status: DC
  Administered 2023-05-28 – 2023-05-31 (×4): 40 mg via ORAL
  Filled 2023-05-28 (×4): qty 2

## 2023-05-28 MED ORDER — ONDANSETRON HCL 4 MG/2ML IJ SOLN: 4.0000 mg | Freq: Four times a day (QID) | INTRAMUSCULAR | Status: DC | PRN

## 2023-05-28 MED ORDER — ASPIRIN 81 MG PO CHEW
324.0000 mg | CHEWABLE_TABLET | Freq: Once | ORAL | Status: AC
  Administered 2023-05-28: 324 mg via ORAL
  Filled 2023-05-28: qty 4

## 2023-05-28 NOTE — Hospital Course (Signed)
Rachael Aguilar is a 57 year old female with history of hypertension, GERD, who presents emergency department for chief concerns of right-sided weakness and slurred speech for 2 days. Patient had multiple falls and near falls because of that over the past 2 days.  Vitals in the ED showed temperature of 98.4, respiration rate 20, heart rate of 102, blood pressure 192/108, SpO2 of 99% on room air.  Serum sodium is 140, potassium 3.0, chloride 102, bicarb 26, BUN of 10, serum creatinine 0.88, nonfasting blood glucose 101, EGFR greater than 60, WBC 6.1, hemoglobin 14.8, platelets of 296.  CT head wo contrast: Was read as subacute lacunar infarct involving the left caudate nucleus and anterior limb of the internal capsule.  Moderate chronic small vessel disease and old right basal ganglia lacunar infarct.  She received full dose of aspirin in ED.  Neurology was consulted. Patient did not receive tPA due to being out of window.  10/20: Blood pressure mildly elevated at 158/102, lipid panel with LDL of 118 with goal of less than 70, potassium 3, magnesium 1.9, CK mildly elevated at 285 with history of fall.  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 CTA of head and neck no LVO.  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  PT is recommending CIR.  10/21:BP mildly elevated but improving.  Starting on losartan.   10/21: Vital stable.  Blood pressure improved on losartan, having some cough for which she can get as needed Mucinex.  No fever or congestion.  No new deficit and prior deficits seems improving. Patient had a bed at [facility] where she is being discharged for further rehab.  Patient will continue on DAPT with aspirin and Plavix for 3 months followed by aspirin  only.  She will continue on current medications and follow-up with her providers after being discharged from [facility].

## 2023-05-28 NOTE — Consult Note (Signed)
NEURO HOSPITALIST CONSULT NOTE   Requesting physician: Dr. Sedalia Muta  Reason for Consult: New onset of right sided weakness and slurred speech  History obtained from:  Patient and Chart     HPI:                                                                                                                                          Rachael Aguilar is an 57 y.o. female with a PMHx of bipolar disorder and HTN who presents to the ED with slurred speech and right sided weakness. She woke up with these symptoms on Friday at 8:00 AM. Since then her right sided weakness and slurred speech have not improved. She fell once yesterday as well as today, which she attributes to her right sided weakness. She denies headache or vision changes. She has no sensory numbness on the right. Has been coughing since onset of her symptoms. No confusion or difficulty with speech comprehension in the context of her dysarthria.   CT reveals a subacute lacunar infarct involving the left caudate nucleus and anterior limb of the internal capsule. She has received aspirin 324 mg in the ED.   CMP unremarkable except for low K of 3.0. CBC normal. Coags normal. EtOH < 10.    Past Medical History:  Diagnosis Date   Bipolar depression ([facility])    Hypertension    Lower GI bleed     Past Surgical History:  Procedure Laterality Date   CHOLECYSTECTOMY N/A 04/02/2016   Procedure: LAPAROSCOPIC CHOLECYSTECTOMY;  Surgeon: Leafy Ro, MD;  Location: [facility] ORS;  Service: General;  Laterality: N/A;   DILATION AND CURETTAGE OF UTERUS     ESOPHAGOGASTRODUODENOSCOPY N/A 11/09/2020   Procedure: ESOPHAGOGASTRODUODENOSCOPY (EGD);  Surgeon: Toledo, Boykin Nearing, MD;  Location: [facility] ENDOSCOPY;  Service: Gastroenterology;  Laterality: N/A;   FLEXIBLE SIGMOIDOSCOPY N/A 11/09/2020   Procedure: FLEXIBLE SIGMOIDOSCOPY;  Surgeon: Toledo, Boykin Nearing, MD;  Location: [facility] ENDOSCOPY;  Service: Gastroenterology;  Laterality: N/A;    Family History   Problem Relation Age of Onset   Ovarian cancer Mother    Throat cancer Mother    Hypertension Mother    Lupus Maternal Grandmother    Rheum arthritis Maternal Aunt            Social History:  reports that she has never smoked. She has never used smokeless tobacco. She reports current alcohol use of about 10.0 standard drinks of alcohol per week. No history on file for drug use.  No Known Allergies  HOME MEDICATIONS:  No current facility-administered medications on file prior to encounter.   Current Outpatient Medications on File Prior to Encounter  Medication Sig Dispense Refill   amLODipine (NORVASC) 5 MG tablet Take 1 tablet by mouth once daily 90 tablet 0   cyclobenzaprine (FLEXERIL) 10 MG tablet Take 1 tablet (10 mg total) by mouth 3 (three) times daily as needed for muscle spasms. 30 tablet 3   lisinopril-hydrochlorothiazide (ZESTORETIC) 10-12.5 MG tablet Take 1 tablet by mouth daily. (Patient not taking: Reported on 12/23/2021) 30 tablet 11   meloxicam (MOBIC) 15 MG tablet Take 1 tablet (15 mg total) by mouth daily. 90 tablet 3   mometasone (ELOCON) 0.1 % cream Apply topically daily. Apply Daily for rash 15 g 1   pantoprazole (PROTONIX) 40 MG tablet Take 1 tablet (40 mg total) by mouth daily. (Patient not taking: Reported on 12/23/2021) 30 tablet 2   triamterene-hydrochlorothiazide (DYAZIDE) 37.5-25 MG capsule Take 1 each (1 capsule total) by mouth daily. 30 capsule 5     ROS:                                                                                                                                       As per HPI. Does not endorse any additional symptoms.    Blood pressure (!) 192/121, pulse 95, temperature 98.6 F (37 C), temperature source Oral, resp. rate (!) 28, height 5\' 4"  (1.626 m), weight 77.1 kg, last menstrual period 10/13/2020, SpO2  95%.   General Examination:                                                                                                       Physical Exam HEENT- [place]/AT   Lungs- Respirations unlabored Extremities- Warm and well-perfused  Neurological Examination Mental Status: Awake and alert. Oriented x 5. Pleasant and cooperative. Thought content appropriate.  Speech fluent with intact comprehension and naming. Prominent dysarthria is noted.  Cranial Nerves: II: Temporal visual fields intact with no extinction to DSS. PERRL. III,IV, VI: No ptosis. EOMI. No nystagmus. V: Temp sensation equal bilaterally VII: Decreased NL fold on the right.  VIII: Hearing intact to voice IX,X: No hypophonia or hoarseness. Frequent coughing after eating dinner.  XI: Symmetric XII: Midline tongue extension Motor: RUE: 4/5 proximally and distally with bradykinesia.  RLE: 2/5 hip flexion, 4-/5 knee extension and knee flexion, 3/5 ADF and APF LUE and LLE 5/5 Sensory: Temp and FT intact x 4, without asymmetry. No extinction to DSS. Deep Tendon  Reflexes: 1+ and symmetric bilateral biceps and brachioradialis. 3+ bilateral patellar reflexes. Plantars mute bilaterally.  Cerebellar: No ataxia with FNF bilaterally, but slow on the right. No tremor.  Gait: Deferred    Lab Results: Basic Metabolic Panel: Recent Labs  Lab 05/28/23 1418  NA 140  K 3.0*  CL 102  CO2 26  GLUCOSE 101*  BUN 10  CREATININE 0.88  CALCIUM 9.2  MG 1.9    CBC: Recent Labs  Lab 05/28/23 1418  WBC 6.1  NEUTROABS 3.8  HGB 14.8  HCT 43.6  MCV 90.8  PLT 296    Cardiac Enzymes: No results for input(s): "CKTOTAL", "CKMB", "CKMBINDEX", "TROPONINI" in the last 168 hours.  Lipid Panel: No results for input(s): "CHOL", "TRIG", "HDL", "CHOLHDL", "VLDL", "LDLCALC" in the last 168 hours.  Imaging: CT HEAD WO CONTRAST  Result Date: 05/28/2023 CLINICAL DATA:  Acute right-sided weakness and slurred speech. Several recent falls. EXAM:  CT HEAD WITHOUT CONTRAST TECHNIQUE: Contiguous axial images were obtained from the base of the skull through the vertex without intravenous contrast. RADIATION DOSE REDUCTION: This exam was performed according to the departmental dose-optimization program which includes automated exposure control, adjustment of the mA and/or kV according to patient size and/or use of iterative reconstruction technique. COMPARISON:  11/09/2020 FINDINGS: Brain: No evidence of intracranial hemorrhage, hydrocephalus, extra-axial collection, or mass lesion/mass effect. Moderate chronic small vessel disease is again seen. Old lacunar infarct is seen involving the right lentiform nucleus. A new poorly defined area of decreased attenuation is seen involving the left caudate nucleus and anterior limb of the internal capsule, consistent with a subacute lacunar infarct. Vascular:  No hyperdense vessel or other acute findings. Skull: No evidence of fracture or other significant bone abnormality. Sinuses/Orbits:  No acute findings. Other: None. 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 Moderate chronic small vessel disease and old right basal ganglia lacunar infarct. Electronically Signed   By: Danae Orleans M.D.   On: 05/28/2023 15:30     Assessment: 57 y.o. female with a PMHx of bipolar disorder and HTN who presents to the ED with slurred speech and right sided weakness. She woke up with these symptoms on Friday at 8:00 AM. Since then her right sided weakness and slurred speech have not improved. She fell once yesterday as well as today, which she attributes to her right sided weakness. She denies headache or vision changes. She has no sensory numbness on the right. Has been coughing since onset of her symptoms. No confusion or difficulty with speech comprehension in the context of her dysarthria.  - Exam reveals right sided weakness and bradykinesia. Findings are best  referable to the subacute lacunar infarct seen on CT.  - CT head: Subacute lacunar infarct involving the left caudate nucleus and anterior limb of the internal capsule; moderate chronic small vessel disease and old right basal ganglia lacunar infarct. - EKG: Possible Left atrial enlargement; septal infarct (cited on or before 09-Nov-2020) - Out of the acute intervention time windows.  - Stroke risk factors: HTN - Possible underlying etiologies for her stroke include chronic hypertensive microangiopathy, atherothrombotic and cardioembolic.    Recommendations: - Speech therapy consult for frequent coughing after meals.  - HgbA1c, fasting lipid panel - MRI of the brain without contrast - PT consult, OT consult - TTE - Atorvastatin 40 mg po every day. Obtain baseline CK level.  - ASA 81 mg po qd - BP management per standard  protocol. Out of the permissive HTN time window.   - Risk factor modification - Telemetry monitoring - Frequent neuro checks - NPO until passes stroke swallow screen  Addendum: - MRI brain reveals the left caudate nucleus infarct seen on CT to be chronic. An acute infarct is seen in the left pons on DWI. Chronic microvascular ischemic changes and old lacunar infarcts are also noted.  - CTA of head and neck: No intracranial large vessel occlusion. 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 No hemodynamically significant stenosis in the neck. 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 - Adding Plavix to ASA. Continue for 21 days, then with ASA monotherapy indefinitely    Electronically signed: Dr. Caryl Pina 05/28/2023, 5:40 PM

## 2023-05-28 NOTE — Assessment & Plan Note (Signed)
Subacute lacunar infarct involving the left caudate nucleus and anterior limb of the internal capsule. Neurology has been consulted and we appreciate further recommendations Fasting lipid and A1c ordered Patient is outside window for permissive hypertension  Frequent neuro vascular checks Per nursing, patient has passed bedside swallow study PT, OT start tomorrow Fall precaution Admit to telemetry medical, observation

## 2023-05-28 NOTE — Assessment & Plan Note (Signed)
Patient states she family friend recently passed away Patient tearful at bedside Counseled patient that she needs to take herself and not neglect her health

## 2023-05-28 NOTE — Assessment & Plan Note (Signed)
Serum magnesium level is 1.9, potassium remained at 3.0 -Replace potassium and monitor

## 2023-05-28 NOTE — ED Triage Notes (Signed)
Pt via POV from home. Pt c/o R sided weakness and slurred speech since Friday at 0800. States she has had multiple falls and balance issues since then. Pt states she takes medication for high BP. Pt is A&OX4 and NAD

## 2023-05-28 NOTE — Assessment & Plan Note (Addendum)
Home amlodipine 5 mg daily, triamterene-hydrochlorothiazide 37.5-25 mg daily resumed Hydralazine 5 mg IV every 6 hours as needed for SBP greater 165, 4 days ordered

## 2023-05-28 NOTE — H&P (Addendum)
History and Physical   Rachael Aguilar:[id number] DOB: 1965/11/10 DOA: 05/28/2023  PCP: Bosie Clos, MD  Patient coming from: Home via POV  I have personally briefly reviewed patient's old medical records in [facility] EMR.  Chief Concern: Right-sided weakness, slurred speech, started 2 days  HPI: Rachael Aguilar is a 57 year old female with history of hypertension, GERD, who presents emergency department for chief concerns of right-sided weakness and slurred speech for 2 days.  Vitals in the ED showed temperature of 98.4, respiration rate 20, heart rate of 102, blood pressure 192/108, SpO2 of 99% on room air.  Serum sodium is 140, potassium 3.0, chloride 102, bicarb 26, BUN of 10, serum creatinine 0.88, nonfasting blood glucose 101, EGFR greater than 60, WBC 6.1, hemoglobin 14.8, platelets of 296.  CT head wo contrast: Was read as subacute lacunar infarct involving the left caudate nucleus and anterior limb of the internal capsule.  Moderate chronic small vessel disease and old right basal ganglia lacunar infarct.  ED treatment: Aspirin 324 mg p.o. one-time dose, potassium chloride 40 mill equivalent p.o. one-time dose. ---------------------------- At bedside, patient patient was able to tell me her name, her age, location, current calendar year.  She reports that about 2 days ago, she developed right sided weakness, lower extremity worse than upper extremity and slurred speech.  She reports this is never happened before.  She reports she came in because she kept falling and she got tired of falling.  She denies chest pain, shortness of breath, dysuria, hematuria, syncope, loss of consciousness.  Patient states she does not take her blood pressure medication daily as prescribed.  Social history: She lives at home on her own.  She denies tobacco, EtOH, recreational drug use.  She works in a [employer]eron[employer]ical [employer].  ROS: Constitutional: no weight change, no fever ENT/Mouth: no  sore throat, no rhinorrhea Eyes: no eye pain, no vision changes Cardiovascular: no chest pain, no dyspnea,  no edema, no palpitations Respiratory: no cough, no sputum, no wheezing Gastrointestinal: no nausea, no vomiting, no diarrhea, no constipation Genitourinary: no urinary incontinence, no dysuria, no hematuria Musculoskeletal: no arthralgias, no myalgias, + difficulty walking Skin: no skin lesions, no pruritus, Neuro: + weakness, no loss of consciousness, no syncope, + slurred speech Psych: no anxiety, no depression, no decrease appetite Heme/Lymph: no bruising, no bleeding  ED Course: Discussed with emergency medicine provider, patient hospitalization for chief concerns of stroke.  Assessment/Plan  Principal Problem:   Stroke-like symptom Active Problems:   Anxiety and depression   Gastro-esophageal reflux disease without esophagitis   BP (high blood pressure)   Diverticulosis   Hypokalemia   Grief   Assessment and Plan:  * Stroke-like symptom Subacute lacunar infarct involving the left caudate nucleus and anterior limb of the internal capsule. Neurology has been consulted and we appreciate further recommendations Fasting lipid and A1c ordered Patient is outside window for permissive hypertension  Frequent neuro vascular checks Per nursing, patient has passed bedside swallow study PT, OT start tomorrow Fall precaution Admit to telemetry medical, observation  Grief Patient states she family friend recently passed away Patient tearful at bedside Counseled patient that she needs to take herself and not neglect her health  Hypokalemia Serum magnesium level is 1.9 Status post potassium chloride 40 mill equivalent p.o. one-time dose ordered by EDP Polycitra 10 mill equivalent, 2 doses ordered on admission Recheck BMP in the a.m. and magnesium level in the a.m.  BP (high blood pressure) Home amlodipine 5  mg daily, triamterene-hydrochlorothiazide 37.5-25 mg daily  resumed Hydralazine 5 mg IV every 6 hours as needed for SBP greater 165, 4 days ordered  Chart reviewed.   DVT prophylaxis: TED hose; AM team to initiate pharmacologic DVT prophylaxis when the benefits outweigh the risk Code Status: Full code Diet: Heart healthy Family Communication: Updated daughter at bedside with patient's permission Disposition Plan: Pending clinical course Consults called: Neurology Admission status: Telemetry medical, observation  Past Medical History:  Diagnosis Date   Bipolar depression (HCC)    Hypertension    Lower GI bleed    Past Surgical History:  Procedure Laterality Date   CHOLECYSTECTOMY N/A 04/02/2016   Procedure: LAPAROSCOPIC CHOLECYSTECTOMY;  Surgeon: Leafy Ro, MD;  Location: [facility] ORS;  Service: General;  Laterality: N/A;   DILATION AND CURETTAGE OF UTERUS     ESOPHAGOGASTRODUODENOSCOPY N/A 11/09/2020   Procedure: ESOPHAGOGASTRODUODENOSCOPY (EGD);  Surgeon: Toledo, Boykin Nearing, MD;  Location: [facility] ENDOSCOPY;  Service: Gastroenterology;  Laterality: N/A;   FLEXIBLE SIGMOIDOSCOPY N/A 11/09/2020   Procedure: FLEXIBLE SIGMOIDOSCOPY;  Surgeon: Toledo, Boykin Nearing, MD;  Location: [facility] ENDOSCOPY;  Service: Gastroenterology;  Laterality: N/A;   Social History:  reports that she has never smoked. She has never used smokeless tobacco. She reports current alcohol use of about 10.0 standard drinks of alcohol per week. No history on file for drug use.  No Known Allergies Family History  Problem Relation Age of Onset   Ovarian cancer Mother    Throat cancer Mother    Hypertension Mother    Lupus Maternal Grandmother    Rheum arthritis Maternal Aunt    Family history: Family history reviewed and not pertinent.  Prior to Admission medications   Medication Sig Start Date End Date Taking? Authorizing Provider  amLODipine (NORVASC) 5 MG tablet Take 1 tablet by mouth once daily 03/16/22   Bosie Clos, MD  cyclobenzaprine (FLEXERIL) 10 MG tablet Take 1  tablet (10 mg total) by mouth 3 (three) times daily as needed for muscle spasms. 12/23/21   Bosie Clos, MD  lisinopril-hydrochlorothiazide (ZESTORETIC) 10-12.5 MG tablet Take 1 tablet by mouth daily. Patient not taking: Reported on 12/23/2021 11/11/20 11/11/21  Narda Bonds, MD  meloxicam (MOBIC) 15 MG tablet Take 1 tablet (15 mg total) by mouth daily. 12/28/21   Bosie Clos, MD  mometasone (ELOCON) 0.1 % cream Apply topically daily. Apply Daily for rash 04/20/22   Bosie Clos, MD  pantoprazole (PROTONIX) 40 MG tablet Take 1 tablet (40 mg total) by mouth daily. Patient not taking: Reported on 12/23/2021 11/11/20 02/09/21  Narda Bonds, MD  triamterene-hydrochlorothiazide (DYAZIDE) 37.5-25 MG capsule Take 1 each (1 capsule total) by mouth daily. 04/20/22   Bosie Clos, MD   Physical Exam: Vitals:   05/28/23 1715 05/28/23 1730 05/28/23 1745 05/28/23 1800  BP: (!) 186/110 (!) 169/103 (!) 181/125 (!) 177/106  Pulse: 97 98 (!) 101 (!) 103  Resp: (!) 23 (!) 23 (!) 22 (!) 23  Temp:      TempSrc:      SpO2: 97% 99% 99% 98%  Weight:      Height:       Constitutional: appears age-appropriate, NAD, calm Eyes: PERRL, lids and conjunctivae normal ENMT: Mucous membranes are moist. Posterior pharynx clear of any exudate or lesions. Age-appropriate dentition. Hearing appropriate Neck: normal, supple, no masses, no thyromegaly Respiratory: clear to auscultation bilaterally, no wheezing, no crackles. Normal respiratory effort. No accessory muscle use.  Cardiovascular: Regular rate and  rhythm, no murmurs / rubs / gallops. No extremity edema. 2+ pedal pulses. No carotid bruits.  Abdomen: Obese abdomen, no tenderness, no masses palpated, no hepatosplenomegaly. Bowel sounds positive.  Musculoskeletal: no clubbing / cyanosis. No joint deformity upper and lower extremities. Good ROM in the left upper and lower extremity. No contractures, no atrophy. Normal muscle tone.  Skin: no rashes,  lesions, ulcers. No induration Neurologic: Sensation intact. Strength 1/5 in all right lower extremity and 3/5 in right upper extremity.  Left mouth droop Psychiatric: Normal judgment and insight. Alert and oriented x 3. Normal mood.   EKG: independently reviewed, showing sinus rhythm with rate of 100, QTc 441  Chest x-ray on Admission: I personally reviewed and I agree with radiologist reading as below.  CT HEAD WO CONTRAST  Result Date: 05/28/2023 CLINICAL DATA:  Acute right-sided weakness and slurred speech. Several recent falls. EXAM: CT HEAD WITHOUT CONTRAST TECHNIQUE: Contiguous axial images were obtained from the base of the skull through the vertex without intravenous contrast. RADIATION DOSE REDUCTION: This exam was performed according to the departmental dose-optimization program which includes automated exposure control, adjustment of the mA and/or kV according to patient size and/or use of iterative reconstruction technique. COMPARISON:  11/09/2020 FINDINGS: Brain: No evidence of intracranial hemorrhage, hydrocephalus, extra-axial collection, or mass lesion/mass effect. Moderate chronic small vessel disease is again seen. Old lacunar infarct is seen involving the right lentiform nucleus. A new poorly defined area of decreased attenuation is seen involving the left caudate nucleus and anterior limb of the internal capsule, consistent with a subacute lacunar infarct. Vascular:  No hyperdense vessel or other acute findings. Skull: No evidence of fracture or other significant bone abnormality. Sinuses/Orbits:  No acute findings. Other: None. 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 Moderate chronic small vessel disease and old right basal ganglia lacunar infarct. Electronically Signed   By: Danae Orleans M.D.   On: 05/28/2023 15:30    Labs on Admission: I have personally reviewed following labs  CBC: Recent Labs  Lab  05/28/23 1418  WBC 6.1  NEUTROABS 3.8  HGB 14.8  HCT 43.6  MCV 90.8  PLT 296   Basic Metabolic Panel: Recent Labs  Lab 05/28/23 1418  NA 140  K 3.0*  CL 102  CO2 26  GLUCOSE 101*  BUN 10  CREATININE 0.88  CALCIUM 9.2  MG 1.9   GFR: Estimated Creatinine Clearance: 71.8 mL/min (by C-G formula based on SCr of 0.88 mg/dL).  Liver Function Tests: Recent Labs  Lab 05/28/23 1418  AST 24  ALT 24  ALKPHOS 87  BILITOT 1.2  PROT 7.8  ALBUMIN 4.3   Coagulation Profile: Recent Labs  Lab 05/28/23 1418  INR 1.0   Urine analysis:    Component Value Date/Time   COLORURINE YELLOW (A) 11/26/2020 2123   APPEARANCEUR HAZY (A) 11/26/2020 2123   APPEARANCEUR Clear 12/12/2012 0839   LABSPEC 1.023 11/26/2020 2123   LABSPEC 1.020 12/12/2012 0839   PHURINE 6.0 11/26/2020 2123   GLUCOSEU NEGATIVE 11/26/2020 2123   GLUCOSEU Negative 12/12/2012 0839   HGBUR MODERATE (A) 11/26/2020 2123   BILIRUBINUR NEGATIVE 11/26/2020 2123   BILIRUBINUR Negative 12/12/2012 0839   KETONESUR 5 (A) 11/26/2020 2123   PROTEINUR NEGATIVE 11/26/2020 2123   NITRITE NEGATIVE 11/26/2020 2123   LEUKOCYTESUR LARGE (A) 11/26/2020 2123   LEUKOCYTESUR 1+ 12/12/2012 0839   This document was prepared using Dragon Voice Recognition software and may include unintentional  dictation errors.  Dr. Sedalia Muta [facility]  If 7PM-7AM, please contact overnight-coverage provider If 7AM-7PM, please contact day attending provider [email or web address]  05/28/2023, 6:15 PM

## 2023-05-28 NOTE — ED Provider Notes (Signed)
[facility] Provider Note    Event Date/Time   First MD Initiated Contact with Patient 05/28/23 1559     (approximate)   History   Chief Complaint Weakness   HPI  Rachael Aguilar is a 57 y.o. female with past medical history of hypertension, bipolar disorder, and GERD who presents to the ED complaining of weakness.  Patient reports that 2 days ago she woke up with some right-sided weakness as well as slurred speech.  Symptoms have persisted since then and she reports a fall yesterday as well as today due to the weakness on her right side.  She has not noticed any vision changes, denies any fevers, headache, cough, chest pain, or shortness of breath.  She has never had similar symptoms before.     Physical Exam   Triage Vital Signs: ED Triage Vitals  Encounter Vitals Group     BP 05/28/23 1410 (!) 192/108     Systolic BP Percentile --      Diastolic BP Percentile --      Pulse Rate 05/28/23 1410 (!) 102     Resp 05/28/23 1410 20     Temp 05/28/23 1410 98.4 F (36.9 C)     Temp Source 05/28/23 1410 Oral     SpO2 05/28/23 1410 99 %     Weight 05/28/23 1408 170 lb (77.1 kg)     Height 05/28/23 1408 5\' 4"  (1.626 m)     Head Circumference --      Peak Flow --      Pain Score 05/28/23 1408 0     Pain Loc --      Pain Education --      Exclude from Growth Chart --     Most recent vital signs: Vitals:   05/28/23 1410 05/28/23 1608  BP: (!) 192/108 (!) 189/121  Pulse: (!) 102 (!) 105  Resp: 20 15  Temp: 98.4 F (36.9 C) 98.6 F (37 C)  SpO2: 99% 98%    Constitutional: Alert and oriented. Eyes: Conjunctivae are normal. Head: Atraumatic. Nose: No congestion/rhinnorhea. Mouth/Throat: Mucous membranes are moist.  Cardiovascular: Normal rate, regular rhythm. Grossly normal heart sounds.  2+ radial pulses bilaterally. Respiratory: Normal respiratory effort.  No retractions. Lungs CTAB. Gastrointestinal: Soft and nontender. No  distention. Musculoskeletal: No lower extremity tenderness nor edema.  Neurologic: Slurred speech noted.  Subtle right-sided facial droop with 4 out of 5 strength in right upper extremity and 4 out of 5 strength in right lower extremity, 5-5 strength in left upper and lower extremities.    ED Results / Procedures / Treatments   Labs (all labs ordered are listed, but only abnormal results are displayed) Labs Reviewed  COMPREHENSIVE METABOLIC PANEL - Abnormal; Notable for the following components:      Result Value   Potassium 3.0 (*)    Glucose, Bld 101 (*)    All other components within normal limits  PROTIME-INR  APTT  CBC  DIFFERENTIAL  ETHANOL  MAGNESIUM  CBG MONITORING, ED     EKG  ED ECG REPORT I, Chesley Noon, the attending physician, personally viewed and interpreted this ECG.   Date: 05/28/2023  EKG Time: 14:13  Rate: 100  Rhythm: normal sinus rhythm  Axis: Normal  Intervals:none  ST&T Change: Inferior T wave inversions  RADIOLOGY CT head reviewed and interpreted by me with no hemorrhage or midline shift.  PROCEDURES:  Critical Care performed: Yes, see critical care procedure note(s)  .Critical Care  Performed by: Chesley Noon, MD Authorized by: Chesley Noon, MD   Critical care provider statement:    Critical care time (minutes):  30   Critical care time was exclusive of:  Separately billable procedures and treating other patients and teaching time   Critical care was necessary to treat or prevent imminent or life-threatening deterioration of the following conditions:  CNS failure or compromise   Critical care was time spent personally by me on the following activities:  Development of treatment plan with patient or surrogate, discussions with consultants, evaluation of patient's response to treatment, examination of patient, ordering and review of laboratory studies, ordering and review of radiographic studies, ordering and performing treatments  and interventions, pulse oximetry, re-evaluation of patient's condition and review of old charts   I assumed direction of critical care for this patient from another provider in my specialty: no     Care discussed with: admitting provider      MEDICATIONS ORDERED IN ED: Medications  sodium chloride flush (NS) 0.9 % injection 3 mL (has no administration in time range)  potassium chloride SA (KLOR-CON M) CR tablet 40 mEq (has no administration in time range)  aspirin chewable tablet 324 mg (has no administration in time range)     IMPRESSION / MDM / ASSESSMENT AND PLAN / ED COURSE  I reviewed the triage vital signs and the nursing notes.                              57 y.o. female with past medical history of hypertension, bipolar disorder, and GERD who presents to the ED complaining of right-sided weakness with slurred speech and a couple of falls over the past 2 days.  Patient's presentation is most consistent with acute presentation with potential threat to life or bodily function.  Differential diagnosis includes, but is not limited to, stroke, TIA, anemia, electrolyte abnormality, AKI, hyperglycemia, UTI.  Patient nontoxic-appearing and in no acute distress, vital signs are remarkable for mild tachycardia and hypertension.  Presentation concerning for stroke and patient noted to have right-sided weakness with slurred speech on exam.  CT imaging shows subacute left thalamic stroke, consistent with patient's presentation.  We will allow permissive hypertension and give loading dose of aspirin.  Labs are unremarkable with no significant anemia, leukocytosis, or AKI, but patient does have hypokalemia which we will replete.  LFTs and coags are unremarkable, case discussed with hospitalist for admission.      FINAL CLINICAL IMPRESSION(S) / ED DIAGNOSES   Final diagnoses:  Cerebrovascular accident (CVA), unspecified mechanism (HCC)     Rx / DC Orders   ED Discharge Orders      None        Note:  This document was prepared using Dragon voice recognition software and may include unintentional dictation errors.   Chesley Noon, MD 05/28/23 [phone]

## 2023-05-29 ENCOUNTER — Encounter: Payer: Self-pay | Admitting: Internal Medicine

## 2023-05-29 ENCOUNTER — Observation Stay (HOSPITAL_COMMUNITY)
Admit: 2023-05-29 | Discharge: 2023-05-29 | Disposition: A | Discharge status: Home / Self Care | Payer: Self-pay | Attending: Neurology | Admitting: Neurology

## 2023-05-29 DIAGNOSIS — F4321 Adjustment disorder with depressed mood: Secondary | ICD-10-CM

## 2023-05-29 DIAGNOSIS — E876 Hypokalemia: Secondary | ICD-10-CM

## 2023-05-29 DIAGNOSIS — I6389 Other cerebral infarction: Secondary | ICD-10-CM

## 2023-05-29 DIAGNOSIS — I639 Cerebral infarction, unspecified: Secondary | ICD-10-CM | POA: Insufficient documentation

## 2023-05-29 DIAGNOSIS — I10 Essential (primary) hypertension: Secondary | ICD-10-CM

## 2023-05-29 LAB — ECHOCARDIOGRAM COMPLETE BUBBLE STUDY
AR max vel: 1.98 cm2
AV Area VTI: 1.71 cm2
AV Area mean vel: 1.67 cm2
AV Mean grad: 6 mm[Hg]
AV Peak grad: 10.1 mm[Hg]
Ao pk vel: 1.59 m/s
Area-P 1/2: 8.16 cm2
MV VTI: 2.15 cm2
S' Lateral: 2.6 cm

## 2023-05-29 LAB — CBC
HCT: 41.3 % (ref 36.0–46.0)
Hemoglobin: 14.5 g/dL (ref 12.0–15.0)
MCH: 31.4 pg (ref 26.0–34.0)
MCHC: 35.1 g/dL (ref 30.0–36.0)
MCV: 89.4 fL (ref 80.0–100.0)
Platelets: 277 10*3/uL (ref 150–400)
RBC: 4.62 MIL/uL (ref 3.87–5.11)
RDW: 14.2 % (ref 11.5–15.5)
WBC: 5.4 10*3/uL (ref 4.0–10.5)
nRBC: 0 % (ref 0.0–0.2)

## 2023-05-29 LAB — BASIC METABOLIC PANEL
Anion gap: 10 (ref 5–15)
BUN: 9 mg/dL (ref 6–20)
CO2: 26 mmol/L (ref 22–32)
Calcium: 8.8 mg/dL — ABNORMAL LOW (ref 8.9–10.3)
Chloride: 103 mmol/L (ref 98–111)
Creatinine, Ser: 0.67 mg/dL (ref 0.44–1.00)
GFR, Estimated: >60 mL/min (ref >60)
Glucose, Bld: 145 mg/dL — ABNORMAL HIGH (ref 70–99)
Potassium: 3 mmol/L — ABNORMAL LOW (ref 3.5–5.1)
Sodium: 139 mmol/L (ref 135–145)

## 2023-05-29 LAB — LIPID PANEL
Cholesterol: 173 mg/dL (ref 0–200)
HDL: 42 mg/dL (ref >40)
LDL Cholesterol: 118 mg/dL — ABNORMAL HIGH (ref 0–99)
Total CHOL/HDL Ratio: 4.1 {ratio}
Triglycerides: 64 mg/dL (ref <150)
VLDL: 13 mg/dL (ref 0–40)

## 2023-05-29 LAB — HEMOGLOBIN A1C
Hgb A1c MFr Bld: 6.6 % — ABNORMAL HIGH (ref 4.8–5.6)
Mean Plasma Glucose: 142.72 mg/dL

## 2023-05-29 LAB — MAGNESIUM: Magnesium: 1.9 mg/dL (ref 1.7–2.4)

## 2023-05-29 MED ORDER — AMLODIPINE BESYLATE 10 MG PO TABS: 10.0000 mg | ORAL_TABLET | Freq: Every day | ORAL | Status: DC

## 2023-05-29 MED ORDER — GUAIFENESIN 100 MG/5ML PO LIQD
5.0000 mL | ORAL | Status: DC | PRN
  Administered 2023-05-29 – 2023-05-31 (×2): 5 mL via ORAL
  Filled 2023-05-29 (×2): qty 10

## 2023-05-29 MED ORDER — POTASSIUM CHLORIDE CRYS ER 20 MEQ PO TBCR
40.0000 meq | EXTENDED_RELEASE_TABLET | Freq: Once | ORAL | Status: AC
  Administered 2023-05-29: 40 meq via ORAL
  Filled 2023-05-29: qty 2

## 2023-05-29 MED ORDER — AMLODIPINE BESYLATE 5 MG PO TABS: 5.0000 mg | ORAL_TABLET | Freq: Every day | ORAL | Status: DC

## 2023-05-29 MED ORDER — AMLODIPINE BESYLATE 10 MG PO TABS
10.0000 mg | ORAL_TABLET | Freq: Every day | ORAL | Status: DC
  Administered 2023-05-29 – 2023-05-31 (×3): 10 mg via ORAL
  Filled 2023-05-29 (×3): qty 1

## 2023-05-29 MED ORDER — CLOPIDOGREL BISULFATE 75 MG PO TABS
75.0000 mg | ORAL_TABLET | Freq: Every day | ORAL | Status: DC
  Administered 2023-05-29 – 2023-05-31 (×3): 75 mg via ORAL
  Filled 2023-05-29 (×3): qty 1

## 2023-05-29 NOTE — Evaluation (Signed)
Clinical/Bedside Swallow Evaluation Patient Details  Name: Rachael Aguilar MRN: [id number] Date of Birth: [birth year]  Today's Date: 05/29/2023 Time: SLP Start Time (ACUTE ONLY): 0910 SLP Stop Time (ACUTE ONLY): 6440 SLP Time Calculation (min) (ACUTE ONLY): 12 min  Past Medical History:  Past Medical History:  Diagnosis Date   Bipolar depression (HCC)    Hypertension    Lower GI bleed    Past Surgical History:  Past Surgical History:  Procedure Laterality Date   CHOLECYSTECTOMY N/A 04/02/2016   Procedure: LAPAROSCOPIC CHOLECYSTECTOMY;  Surgeon: Leafy Ro, MD;  Location: [facility] ORS;  Service: General;  Laterality: N/A;   DILATION AND CURETTAGE OF UTERUS     ESOPHAGOGASTRODUODENOSCOPY N/A 11/09/2020   Procedure: ESOPHAGOGASTRODUODENOSCOPY (EGD);  Surgeon: Toledo, Boykin Nearing, MD;  Location: [facility] ENDOSCOPY;  Service: Gastroenterology;  Laterality: N/A;   FLEXIBLE SIGMOIDOSCOPY N/A 11/09/2020   Procedure: FLEXIBLE SIGMOIDOSCOPY;  Surgeon: Toledo, Boykin Nearing, MD;  Location: [facility] ENDOSCOPY;  Service: Gastroenterology;  Laterality: N/A;   HPI:  Pt is a 57 year old female presented to ED on 10/19 with chief concerns of R-sided weakness and slurred speech for 2 days. Pt with history of hypertension and GERD.  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     Moderate chronic small vessel disease and old right basal ganglia  lacunar infarct." MRI brain, 10/19: "1. Acute infarct in the left pons.  2. 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 Pt alert, pleasant, and cooperative. Dysarthria noted c/b hoarse/hypophonic vocal quality and mild-moderate articulatory imprecision. Further assessment warrented. Pt demonstrated an intact oral swallow. Pharyngeal swallow  appeared [facility] per clinical assessment. Recommend continuation of a regular diet with thin liquids with standard aspiration precautions. Pt will f/u per POC for cognitive-communication and motor speech evaluation. Based on today's assessment, anticipate benefit of f/u SLP services for dysarthria in alignment with OT/PT recommendations.  SLP Visit Diagnosis: Dysphagia, unspecified (R13.10)    Aspiration Risk  Mild aspiration risk    Diet Recommendation Regular;Thin liquid    Liquid Administration via: Spoon;Cup;Straw Medication Administration:  (as tolerated) Supervision: Patient able to self feed (set up) Compensations: Slow rate;Small sips/bites Postural Changes: Seated upright at 90 degrees;Remain upright for at least 30 minutes after po intake    Other  Recommendations Oral Care Recommendations: Oral care BID (set up)    Recommendations for follow up therapy are one component of a multi-disciplinary discharge planning process, led by the attending physician.  Recommendations may be updated based on patient status, additional functional criteria and insurance authorization.  Follow up Recommendations  (f/u SLP services in line with PT/OT recommendations for speech/language)         Functional Status Assessment Patient has had a recent decline in their functional status and demonstrates the ability to make significant improvements in function in a reasonable and predictable amount of time.  Frequency and Duration  (TBD f/u further speech/language/cognitive eval)   (TBD)       Prognosis Prognosis for improved oropharyngeal function: Good      Swallow Study   General Date of Onset: 05/28/23 HPI: Pt is a 57 year old female presented to ED on 10/19 with chief concerns of R-sided weakness and slurred speech for 2 days. Pt with history of hypertension and GERD.  CT  head, 10/19: "Subacute lacunar infarct involving the left caudate nucleus and  anterior limb of the internal capsule.      Moderate chronic small vessel disease and old right basal ganglia  lacunar infarct." MRI brain, 10/19: "1. Acute infarct in the left pons.  2. 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." Type of Study: Bedside Swallow Evaluation Previous Swallow Assessment: none Diet Prior to this Study: Regular;Thin liquids (Level 0) Temperature Spikes Noted: No Respiratory Status: Room air History of Recent Intubation: No Behavior/Cognition: Alert Oral Cavity Assessment: Within Functional Limits Oral Care Completed by SLP: Yes Oral Cavity - Dentition: Missing dentition Vision: Functional for self-feeding Self-Feeding Abilities: Able to feed self Patient Positioning: Upright in bed Baseline Vocal Quality: Hoarse;Low vocal intensity Volitional Cough: Strong Volitional Swallow: Able to elicit    Oral/Motor/Sensory Function Overall Oral Motor/Sensory Function: Within functional limits   Ice Chips Ice chips: Not tested   Thin Liquid Thin Liquid: Within functional limits Presentation: Self Fed;Straw    Nectar Thick Nectar Thick Liquid: Not tested   Honey Thick Honey Thick Liquid: Not tested   Puree Puree: Within functional limits Presentation: Self Fed;Spoon   Solid     Solid: Within functional limits Presentation: Self Fed     Clyde Canterbury, M.S., CCC-SLP Speech-Language Pathologist [facility] [phone] (ASCOM)  Alessandra Bevels Shatasha Lambing 05/29/2023,9:40 AM

## 2023-05-29 NOTE — TOC CM/SW Note (Addendum)
Awaiting CIR eval. Confirmed with CIR Representative they do take patients that do not have insurance.  Alfonso Ramus, LCSW Transitions of Care Department [phone]

## 2023-05-29 NOTE — Progress Notes (Signed)
Inpatient Rehab Admissions Coordinator Note:   Per PT patient was screened for CIR candidacy by Shirlene Andaya Luvenia Starch, CCC-SLP. At this time, pt appears to be a potential candidate for CIR. I will place an order for rehab consult for full assessment, per our protocol.  Please contact me any with questions.Wolfgang Phoenix, MS, CCC-SLP Admissions Coordinator [phone] 05/29/23 12:40 PM

## 2023-05-29 NOTE — Progress Notes (Signed)
Physical Therapy Evaluation Patient Details Name: Rachael Aguilar MRN: [id number] DOB: February 19, 1966 Today's Date: 05/29/2023  History of Present Illness  Pt is a 57 year old female presented to ED on 10/19 with chief concerns of R-sided weakness and slurred speech for 2 days. Pt with history of hypertension and GERD.  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   Clinical Impression  Patient supine in bed upon arrival, agreeable to PT evaluation this date/time with encouragement. Per patient reports, patient was IND with Mobility, ADLS and was working prior to admission. Per patient reports, sons can provide assistance PRN. Patient presents with decreased gross motor control on R side, impaired balance, and R sided weakness. Today, patient require CGA for bed mobility, Min A to stand and transfer to recliner with use of RW. Trialed without RW, Mod A with buckling noted in RLE. Able to take A/P and lateral steps to transfer to recliner with Min A and RW. Anticipate +2 assist to progress ambulation distance. Patient will benefit from acute skilled PT services to address impairments (see below for additional details) and return to PLOF. Anticipate need for continued PT services upon acute discharge. Patient left in recliner with all needs in reach and set up with breakfast tray. RN notified of mobility status. Will continue to follow acutely.       If plan is discharge home, recommend the following: A little help with walking and/or transfers;A little help with bathing/dressing/bathroom;Help with stairs or ramp for entrance;Assist for transportation;Assistance with cooking/housework   Can travel by private vehicle        Equipment Recommendations None recommended by PT (TBD at next venue of care)  Recommendations for Other Services  Rehab consult    Functional Status Assessment Patient has had a recent decline in their  functional status and demonstrates the ability to make significant improvements in function in a reasonable and predictable amount of time.     Precautions / Restrictions Precautions Precautions: Fall Restrictions Weight Bearing Restrictions: No      Mobility  Bed Mobility Overal bed mobility: Needs Assistance Bed Mobility: Supine to Sit     Supine to sit: Contact guard     General bed mobility comments: cues for technique with completion, CGA for steadying. Increased time required.    Transfers Overall transfer level: Needs assistance Equipment used: Rolling walker (2 wheels) Transfers: Sit to/from Stand, Bed to chair/wheelchair/BSC Sit to Stand: Min assist   Step pivot transfers: Min assist       General transfer comment: Min A to stand from EOB with cues for hand placement, mild buckling noted in RLE initially.    Ambulation/Gait Ambulation/Gait assistance: Min assist Gait Distance (Feet): 4 Feet Assistive device: Rolling walker (2 wheels) Gait Pattern/deviations: Decreased step length - right, Decreased step length - left, Decreased stance time - right, Decreased dorsiflexion - right, Knees buckling (R Knee Buckles (Mildly)) Gait velocity: Decreased     General Gait Details: patient able to complete anterior and lateral steps approx ~ 4 ft from bed to recliner; cues for sequencing and BOS. Pt refused additional distance due to breakfast being delivered during session. BP: 165/106  Stairs            Wheelchair Mobility     Tilt Bed    Modified Rankin (Stroke Patients Only)       Balance Overall balance assessment: Needs assistance Sitting-balance support: No upper extremity supported, Feet supported  Sitting balance-Leahy Scale: Good Sitting balance - Comments: no LOB seated EOB   Standing balance support: Bilateral upper extremity supported, During functional activity, Reliant on assistive device for balance Standing balance-Leahy Scale:  Fair Standing balance comment: increased reliance on RW; Min A                             Pertinent Vitals/Pain Pain Assessment Pain Assessment: No/denies pain    Home Living Family/patient expects to be discharged to:: Private residence Living Arrangements: Children Available Help at Discharge: Family;Available PRN/intermittently Type of Home: Mobile home Home Access: Stairs to enter Entrance Stairs-Rails: Right;Left;Can reach both Entrance Stairs-Number of Steps: 5   Home Layout: One level Home Equipment: None      Prior Function Prior Level of Function : Independent/Modified Independent             Mobility Comments: amb with no AD, a few falls prior to admission due to acute symptoms ADLs Comments: indep in ADL/IADL     Extremity/Trunk Assessment   Upper Extremity Assessment Upper Extremity Assessment: Right hand dominant;RUE deficits/detail RUE Deficits / Details: ROM 100 degrees of shoulder flexion, mild spacisty noted in shouler only RUE: Shoulder pain with ROM RUE Sensation: WNL RUE Coordination: decreased fine motor    Lower Extremity Assessment Lower Extremity Assessment: Defer to PT evaluation RLE Deficits / Details: decreased strength grossly on RLE; 3+/5 RLE Sensation: decreased light touch RLE Coordination: decreased gross motor LLE Deficits / Details: WNL LLE Sensation: WNL LLE Coordination: WNL    Cervical / Trunk Assessment Cervical / Trunk Assessment: Normal  Communication   Communication Communication: Difficulty communicating thoughts/reduced clarity of speech Cueing Techniques: Verbal cues;Tactile cues  Cognition Arousal: Alert Behavior During Therapy: WFL for tasks assessed/performed Overall Cognitive Status: Within Functional Limits for tasks assessed                                          General Comments      Exercises     Assessment/Plan    PT Assessment Patient needs continued PT  services  PT Problem List Decreased strength;Decreased activity tolerance;Decreased balance;Decreased mobility;Decreased coordination;Impaired sensation       PT Treatment Interventions DME instruction;Gait training;Stair training;Functional mobility training;Therapeutic activities;Balance training;Therapeutic exercise;Neuromuscular re-education    PT Goals (Current goals can be found in the Care Plan section)  Acute Rehab PT Goals Patient Stated Goal: get back to normal PT Goal Formulation: With patient Time For Goal Achievement: 06/12/23 Potential to Achieve Goals: Good    Frequency Min 1X/week     Co-evaluation               AM-PAC PT "6 Clicks" Mobility  Outcome Measure Help needed turning from your back to your side while in a flat bed without using bedrails?: A Little Help needed moving from lying on your back to sitting on the side of a flat bed without using bedrails?: A Little Help needed moving to and from a bed to a chair (including a wheelchair)?: A Little Help needed standing up from a chair using your arms (e.g., wheelchair or bedside chair)?: A Little Help needed to walk in hospital room?: A Lot Help needed climbing 3-5 steps with a railing? : A Lot 6 Click Score: 16    End of Session Equipment Utilized During Treatment: Gait belt Activity  Tolerance: Patient tolerated treatment well Patient left: in chair;with chair alarm set;with call bell/phone within reach Nurse Communication: Mobility status;Other (comment) (BP readings with mobility) PT Visit Diagnosis: Unsteadiness on feet (R26.81);Hemiplegia and hemiparesis;Muscle weakness (generalized) (M62.81);Other abnormalities of gait and mobility (R26.89) Hemiplegia - Right/Left: Right Hemiplegia - dominant/non-dominant: Dominant Hemiplegia - caused by: Cerebral infarction    Time: 4403-4742 PT Time Calculation (min) (ACUTE ONLY): 25 min   Charges:   PT Evaluation $PT Eval Moderate Complexity: 1 Mod    PT General Charges $$ ACUTE PT VISIT: 1 Visit         Creed Copper Fairly, PT, DPT 05/29/23 10:33 AM

## 2023-05-29 NOTE — Evaluation (Signed)
Occupational Therapy Evaluation Patient Details Name: Rachael Aguilar MRN: [id number] DOB: [birth year] Today's Date: 05/29/2023   History of Present Illness Pt is a 57 year old female presented to ED on 10/19 with chief concerns of R-sided weakness and slurred speech for 2 days. Pt with history of hypertension and GERD.  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   Clinical Impression   Pt was seen for OT evaluation this date. Prior to hospital admission, Pt was MOD I- Indep. Pt lives in house with her children. Pt was alert and oriented x4 during session. Pt communication was notably dysarthric but able to understand. Pt presented with slow processing throughout session.   Pt presents to acute OT demonstrating impaired ADL performance and functional mobility (See OT problem list for additional functional deficits).1st STS + amb within room; Pt required MODA - MINA + 2, R knee block for lift off during STS at recliner. 2nd STS attempt Pt required MINA + 2, knee blocking provided to prev buckling. In total Pt amb ~15 ft within room. Noted Pt RUE deficits; AROM approx 100 degrees of shoulder flexion, decreased FMC, and bradykinetic movement throughout session. Pt educated on positioning of the RUE, and importance of movement of her affected side. Pt would benefit from skilled OT services to address noted impairments and functional limitations (see below for any additional details) in order to maximize safety and independence while minimizing falls risk and caregiver burden. OT will follow acutely.       If plan is discharge home, recommend the following: Assistance with cooking/housework;Assist for transportation;Help with stairs or ramp for entrance;A little help with walking and/or transfers;A lot of help with bathing/dressing/bathroom;Direct supervision/assist for medications management    Functional Status Assessment   Patient has had a recent decline in their functional status and demonstrates the ability to make significant improvements in function in a reasonable and predictable amount of time.  Equipment Recommendations  Other (comment) (next venue of care)    Recommendations for Other Services       Precautions / Restrictions Precautions Precautions: Fall Restrictions Weight Bearing Restrictions: No      Mobility Bed Mobility Overal bed mobility: Needs Assistance             General bed mobility comments: NT pt in recliner pre/post session    Transfers Overall transfer level: Needs assistance Equipment used: 2 person hand held assist Transfers: Sit to/from Stand Sit to Stand: Min assist, +2 physical assistance, Mod assist           General transfer comment: 1st attempt Min-ModA +2; 2nd attempt Min A +2 to stand from recliner.      Balance Overall balance assessment: Needs assistance Sitting-balance support: No upper extremity supported, Feet supported Sitting balance-Leahy Scale: Good Sitting balance - Comments: No LOB seated in recliner   Standing balance support: Bilateral upper extremity supported, During functional activity Standing balance-Leahy Scale: Fair Standing balance comment: Reliant on provided HHA +2 support througout t/f                           ADL either performed or assessed with clinical judgement   ADL Overall ADL's : Needs assistance/impaired                         Toilet Transfer: Minimal assistance;Moderate assistance;+2 for physical assistance Toilet Transfer Details (indicate  cue type and reason): Simulated amb to BR from recliner   Toileting - Clothing Manipulation Details (indicate cue type and reason): Pt reports acute incontence     Functional mobility during ADLs: Pt amb ~15 ft within room with + 2 assistance no DME.        Vision Baseline Vision/History: 1 Wears glasses Patient Visual Report: No change from  baseline Vision Assessment?: Yes Eye Alignment: Within Functional Limits Ocular Range of Motion: Within Functional Limits Alignment/Gaze Preference: Within Defined Limits Tracking/Visual Pursuits: Able to track stimulus in all quads without difficulty Convergence: Within functional limits Visual Fields: No apparent deficits Additional Comments: appears WFL, will continue to assess     Perception Perception:  (will continue to assess, no noted R sided inattention on this date)   Perception-Other Comments: Will continue to assess, no noted right sided inattention   Praxis Praxis: Impaired Praxis Impairment Details: Initiation, Motor planning Praxis-Other Comments: Will continue to assess   Pertinent Vitals/Pain Pain Assessment Pain Assessment: No/denies pain     Extremity/Trunk Assessment Upper Extremity Assessment Upper Extremity Assessment: Right hand dominant;RUE deficits/detail RUE Deficits / Details: AROM: shoulder flexion: 100 degrees of shoulder flexion;, elbow flexion/extension WFL, wrist WFL, grip appears mildly weak bilaterally; PROM appears grossly WFL with pain at end range of shoulder PROM; will continue to assess for spasticity  RUE: Shoulder pain with ROM RUE Sensation: WNL RUE Coordination: decreased fine motor   Lower Extremity Assessment Lower Extremity Assessment: Defer to PT evaluation    Cervical / Trunk Assessment Cervical / Trunk Assessment: Normal   Communication Communication Communication: Difficulty communicating thoughts/reduced clarity of speech Cueing Techniques: Verbal cues;Tactile cues   Cognition Arousal: Alert Behavior During Therapy: WFL for tasks assessed/performed Overall Cognitive Status: Impaired/Different from baseline Area of Impairment: Problem solving                             Problem Solving: Slow processing General Comments: AO x4 on this date     General Comments       Exercises Other Exercises Other  Exercises: Edu: Role of OT, RUE positioning for joint protection, continous movement of affected extremity, d/c planning   Shoulder Instructions      Home Living Family/patient expects to be discharged to:: Private residence Living Arrangements: Children Available Help at Discharge: Family;Available PRN/intermittently Type of Home: Mobile home Home Access: Stairs to enter Entrance Stairs-Number of Steps: 5 Entrance Stairs-Rails: Right;Left;Can reach both Home Layout: One level     Bathroom Shower/Tub: Chief Strategy Officer: Standard     Home Equipment: None          Prior Functioning/Environment Prior Level of Function : Independent/Modified Independent             Mobility Comments: amb with no AD, a few falls prior to admission due to acute symptoms ADLs Comments: indep in ADL/IADL, works at a [employer]         OT Problem List: Decreased strength;Decreased coordination;Decreased cognition;Decreased activity tolerance;Impaired balance (sitting and/or standing)      OT Treatment/Interventions: Self-care/ADL training;Energy conservation;Balance training;Neuromuscular education;Therapeutic exercise;Therapeutic activities;Patient/family education    OT Goals(Current goals can be found in the care plan section) Acute Rehab OT Goals Patient Stated Goal: to return to PLOF OT Goal Formulation: With patient Time For Goal Achievement: 06/12/23 Potential to Achieve Goals: Good ADL Goals Pt Will Perform Grooming: standing;with modified independence Pt Will Perform Lower Body Dressing:  with modified independence;sit to/from stand Pt Will Transfer to Toilet: with modified independence;ambulating;regular height toilet Pt Will Perform Toileting - Clothing Manipulation and hygiene: with modified independence;sitting/lateral leans Pt/caregiver will Perform Home Exercise Program: Right Upper extremity;With written HEP provided  OT Frequency: Min 1X/week     Co-evaluation              AM-PAC OT "6 Clicks" Daily Activity     Outcome Measure Help from another person eating meals?: None Help from another person taking care of personal grooming?: A Little Help from another person toileting, which includes using toliet, bedpan, or urinal?: A Little Help from another person bathing (including washing, rinsing, drying)?: A Lot Help from another person to put on and taking off regular upper body clothing?: A Lot Help from another person to put on and taking off regular lower body clothing?: A Lot 6 Click Score: 16   End of Session Equipment Utilized During Treatment: Gait belt  Activity Tolerance: Patient tolerated treatment well Patient left: in chair;with call bell/phone within reach;with chair alarm set  OT Visit Diagnosis: Other abnormalities of gait and mobility (R26.89);Muscle weakness (generalized) (M62.81);History of falling (Z91.81)                Time: 0865-7846 OT Time Calculation (min): 18 min Charges:  OT General Charges $OT Visit: 1 Visit OT Evaluation $OT Eval Moderate Complexity: 1 Mod  [employer], OTS

## 2023-05-29 NOTE — Progress Notes (Signed)
  Echocardiogram 2D Echocardiogram has been performed.  Rachael Aguilar 05/29/2023, 7:43 AM

## 2023-05-29 NOTE — Progress Notes (Signed)
Progress Note   Patient: Rachael Aguilar AOZ:[id number] DOB: November 28, 1965 DOA: 05/28/2023     0 DOS: the patient was seen and examined on 05/29/2023   Brief hospital course: Rachael Aguilar is a 57 year old female with history of hypertension, GERD, who presents emergency department for chief concerns of right-sided weakness and slurred speech for 2 days. Patient had multiple falls and near falls because of that over the past 2 days.  Vitals in the ED showed temperature of 98.4, respiration rate 20, heart rate of 102, blood pressure 192/108, SpO2 of 99% on room air.  Serum sodium is 140, potassium 3.0, chloride 102, bicarb 26, BUN of 10, serum creatinine 0.88, nonfasting blood glucose 101, EGFR greater than 60, WBC 6.1, hemoglobin 14.8, platelets of 296.  CT head wo contrast: Was read as subacute lacunar infarct involving the left caudate nucleus and anterior limb of the internal capsule.  Moderate chronic small vessel disease and old right basal ganglia lacunar infarct.  She received full dose of aspirin in ED.  Neurology was consulted. Patient did not receive tPA due to being out of window.  10/20: Blood pressure mildly elevated at 158/102, lipid panel with LDL of 118 with goal of less than 70, potassium 3, magnesium 1.9, CK mildly elevated at 285 with history of fall.  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 CTA of head and neck no LVO.  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  PT is recommending CIR.   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  echocardiogram done with pending results -Neurology is recommending DAPT for 3 weeks with aspirin and Plavix. -Continue with statin -Continue supportive care  Essential hypertension Blood pressure remained elevated. -Increasing the dose of amlodipine to 10 mg daily. -Patient was not taking triamterene-hydrochlorothiazide at home -As needed hydralazine  Hypokalemia Serum magnesium level is 1.9, potassium remained at 3.0 -Replace potassium and monitor  Grief Patient states she family friend recently passed away Patient tearful at bedside Counseled patient that she needs to take herself and not neglect her health   Subjective: Patient was seen and examined today.  Right sided weakness improving, no new deficit.  Patient wants to participate with CIR but was concerned about her insurance.  Physical Exam: Vitals:   05/29/23 0020 05/29/23 0406 05/29/23 0742 05/29/23 1221  BP: (!) 162/102 (!) 173/108 (!) 158/102 (!) 147/99  Pulse: (!) 103 (!) 106 99 (!) 103  Resp: 18 18 18    Temp: 98.6 F (37 C) 98.7 F (37.1 C) 98.4 F (36.9 C) 98 F (36.7 C)  TempSrc:  Oral    SpO2: 98% 100% 97% 97%  Weight:      Height:       General.  Well-developed lady, in no acute distress. Pulmonary.  Lungs clear bilaterally, normal respiratory effort. CV.  Regular rate and rhythm, no JVD, rub or murmur. Abdomen.  Soft, nontender, nondistended, BS positive. CNS.  Alert and oriented .  4+/5 on right upper and lower extremity. Extremities.  No edema, no cyanosis, pulses intact and symmetrical. Psychiatry.  Judgment and insight appears normal.   Data Reviewed: Prior data reviewed  Family Communication: Discussed with patient.  Disposition: Status is: Inpatient Remains inpatient appropriate because: Severity of illness  Planned Discharge Destination:  CIR  Time spent: 45 minutes  This record has been created using Conservation officer, historic buildings. Errors have been sought and corrected,but may  not always be located. Such creation errors do not reflect on the standard of care.   Author: Arnetha Courser, MD 05/29/2023 1:12 PM  For on call review [email or web address].

## 2023-05-29 NOTE — Plan of Care (Signed)
TTE report documents LVEF of 55-60% with normal LV function and no regional wall motion abnormalities. No valvular vegetation or mural thrombus mentioned in the report.   A/R: - Stroke work up is complete.  - Continue with BP management to a long-term goal of 120/80. - Continue ASA and atorvastatin - Outpatient Neurology follow up.  - PT/OT.  - Neurohospitalist service will sign off. Please call if there are additional questions.   Electronically signed: Dr. Caryl Pina

## 2023-05-29 NOTE — Progress Notes (Signed)
Inpatient Rehab Admissions:  Inpatient Rehab Consult received.  I talked with patient on the telephone for rehabilitation assessment and to discuss goals and expectations of an inpatient rehab admission.  Discussed average length of stay, cost of CIR for an uninsured pt, and discharge home after completion of CIR. Pt acknowledged understanding. Pt interested in pursuing CIR.  Pt gave permission to contact daughter Maryruth Eve. Spoke with Chasity on the telephone. She also acknowledged understanding. She is going to discuss support with Aunt Tammy to determine if the two of them can provide 24/7 support for pt after discharge. Will continue to follow.  Signed: Wolfgang Phoenix, MS, CCC-SLP Admissions Coordinator [phone]

## 2023-05-30 MED ORDER — LOSARTAN POTASSIUM 25 MG PO TABS
25.0000 mg | ORAL_TABLET | Freq: Every day | ORAL | Status: DC
  Administered 2023-05-30 – 2023-05-31 (×2): 25 mg via ORAL
  Filled 2023-05-30 (×2): qty 1

## 2023-05-30 NOTE — PMR Pre-admission (Signed)
PMR Admission Coordinator Pre-Admission Assessment  Patient: Rachael Aguilar is an 57 y.o., female MRN: [id number] DOB: Dec 13, 1965 Height: 5\' 4"  (162.6 cm) Weight: 73.6 kg  Insurance Information HMO:     PPO:      PCP:      IPA:      80/20:      OTHER:  PRIMARY: UNINSURED      Policy#:       Subscriber:  CM Name:       Phone#:      Fax#:  Pre-Cert#:       Employer:  Benefits:  Phone #:      Name:  Eff. Date:      Deduct:       Out of Pocket Max:       Life Max:  CIR:       SNF:  Outpatient:      Co-Pay:  Home Health:       Co-Pay:  DME:      Co-Pay:  Providers:  SECONDARY:       Policy#:      Phone#:   ArtistAlfredo Martinez      Phone#: [phone]  The "Data Collection Information Summary" for patients in Inpatient Rehabilitation Facilities with attached "Privacy Act Statement-Health Care Records" was provided and verbally reviewed with: {CHL IP Patient Family AO:130865784}  Emergency Contact Information Contact Information     Name Relation Home Work [place] Daughter [phone]  [phone]      Other Contacts   None on File     Current Medical History  Patient Admitting Diagnosis: CVA History of Present Illness: Pt is a 57 year old female with medical hx significant for: HTN, bipolar disorder, GERD. Pt presented to [facility] on 05/28/23 d/t right-sided weakness and slurred speech x2 days and also falls. 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 Neurology consulted. MRI showed the left caudate nucleus infarct seen on CT was chronic. Acute infarct in left pons noted. CTA head/neck was negative for LVO. Therapy evaluations completed and CIR recommended d/t pt's functional mobility deficits and cognitive-linguistic deficits. Complete NIHSS TOTAL: 3  Patient's medical record from [facility] has been reviewed by the rehabilitation admission  coordinator and physician.  Past Medical History  Past Medical History:  Diagnosis Date   Bipolar depression (HCC)    Hypertension    Lower GI bleed     Has the patient had major surgery during 100 days prior to admission? No  Family History   family history includes Hypertension in her mother; Lupus in her maternal grandmother; Ovarian cancer in her mother; Rheum arthritis in her maternal aunt; Throat cancer in her mother.  Current Medications  Current Facility-Administered Medications:    acetaminophen (TYLENOL) tablet 650 mg, 650 mg, Oral, Q6H PRN, 650 mg at 05/29/23 1228 **OR** acetaminophen (TYLENOL) suppository 650 mg, 650 mg, Rectal, Q6H PRN, Cox, Amy N, DO   amLODipine (NORVASC) tablet 10 mg, 10 mg, Oral, Daily, Amin, Sumayya, MD, 10 mg at 05/30/23 0859   aspirin EC tablet 81 mg, 81 mg, Oral, Daily, Caryl Pina, MD, 81 mg at 05/30/23 0858   atorvastatin (LIPITOR) tablet 40 mg, 40 mg, Oral, Daily, Caryl Pina, MD, 40 mg at 05/30/23 0859   clopidogrel (PLAVIX) tablet 75 mg, 75 mg, Oral, Daily, Caryl Pina, MD, 75 mg at 05/30/23 0858   cyclobenzaprine (FLEXERIL) tablet 10 mg, 10 mg, Oral,  TID PRN, Cox, Amy N, DO, 10 mg at 05/29/23 0018   guaiFENesin (ROBITUSSIN) 100 MG/5ML liquid 5 mL, 5 mL, Oral, Q4H PRN, Arnetha Courser, MD, 5 mL at 05/29/23 1228   hydrALAZINE (APRESOLINE) injection 5 mg, 5 mg, Intravenous, Q6H PRN, Cox, Amy N, DO, 5 mg at 05/28/23 1710   losartan (COZAAR) tablet 25 mg, 25 mg, Oral, Daily, Amin, Tilman Neat, MD   ondansetron (ZOFRAN) tablet 4 mg, 4 mg, Oral, Q6H PRN **OR** ondansetron (ZOFRAN) injection 4 mg, 4 mg, Intravenous, Q6H PRN, Cox, Amy N, DO   senna-docusate (Senokot-S) tablet 1 tablet, 1 tablet, Oral, QHS PRN, Cox, Amy N, DO   sodium chloride flush (NS) 0.9 % injection 3 mL, 3 mL, Intravenous, Once, Cox, Amy N, DO  Patients Current Diet:  Diet Order             Diet Heart Room service appropriate? Yes; Fluid consistency: Thin  Diet effective now                    Precautions / Restrictions Precautions Precautions: Fall Restrictions Weight Bearing Restrictions: No   Has the patient had 2 or more falls or a fall with injury in the past year? Yes  Prior Activity Level Limited Community (1-2x/wk): drives, gets out of house ~2 days/week  Prior Functional Level Self Care: Did the patient need help bathing, dressing, using the toilet or eating? Independent  Indoor Mobility: Did the patient need assistance with walking from room to room (with or without device)? Independent  Stairs: Did the patient need assistance with internal or external stairs (with or without device)? Independent  Functional Cognition: Did the patient need help planning regular tasks such as shopping or remembering to take medications? Independent  Patient Information Are you of Hispanic, Latino/a,or Spanish origin?: A. No, not of Hispanic, Latino/a, or Spanish origin What is your race?: B. Black or African American Do you need or want an interpreter to communicate with a doctor or health care staff?: 0. No  Patient's Response To:  Health Literacy and Transportation Is the patient able to respond to health literacy and transportation needs?: Yes Health Literacy - How often do you need to have someone help you when you read instructions, pamphlets, or other written material from your doctor or pharmacy?: Never In the past 12 months, has lack of transportation kept you from medical appointments or from getting medications?: No In the past 12 months, has lack of transportation kept you from meetings, work, or from getting things needed for daily living?: No  Home Assistive Devices / Equipment Home Equipment: None  Prior Device Use: Indicate devices/aids used by the patient prior to current illness, exacerbation or injury? None of the above  Current Functional Level Cognition  Arousal/Alertness: Awake/alert Overall Cognitive Status: Impaired/Different  from baseline Orientation Level: Oriented X4 General Comments: AO x4 Attention: Sustained, Selective Selective Attention: Impaired Memory: Impaired Awareness: Impaired Awareness Impairment: Intellectual impairment Problem Solving: Impaired Problem Solving Impairment: Verbal complex Executive Function: Reasoning, Self Monitoring, Self Correcting Reasoning: Impaired Self Monitoring: Impaired Self Correcting: Impaired Safety/Judgment: Impaired    Extremity Assessment (includes Sensation/Coordination)  Upper Extremity Assessment: Right hand dominant, RUE deficits/detail RUE Deficits / Details: R shoulder shoulder/abd 2+, elbow flex 3+, elbow ext 3, sup/pron 3, wrist flex/ext 3 RUE: Shoulder pain with ROM RUE Sensation: WNL RUE Coordination: decreased fine motor (mild-moderate RUE apraxia; able to make hand signals of numbers 1-5 in R hand with extra time and vc)  Lower  Extremity Assessment: Defer to PT evaluation RLE Deficits / Details: decreased strength grossly on RLE; 3+/5 RLE Sensation: decreased light touch RLE Coordination: decreased gross motor LLE Deficits / Details: WNL LLE Sensation: WNL LLE Coordination: WNL    ADLs  Overall ADL's : Needs assistance/impaired Eating/Feeding: Minimal assistance Eating/Feeding Details (indicate cue type and reason): CGA to maintain grasp of water bottle in R hand and bring to mouth x3 sips.  Pt able to stabilize bottle on R thigh with R hand while opening top with L hand.  Encouraged pt begin attempts at eating finger foods with R hand for lunch and supper this date. Toilet Transfer: Minimal assistance, Moderate assistance, +2 for physical assistance Toilet Transfer Details (indicate cue type and reason): simulated amb to BR > sit in recliner Toileting - Clothing Manipulation Details (indicate cue type and reason): Pt reports acute incontence Functional mobility during ADLs: Moderate assistance    Mobility  Overal bed mobility: Needs  Assistance Bed Mobility: Supine to Sit Supine to sit: Supervision General bed mobility comments: NT this date as pt was received sitting EOB and left with SLP sitting EOB.    Transfers  Overall transfer level: Needs assistance Equipment used: Rolling walker (2 wheels) Transfers: Sit to/from Stand Sit to Stand: Contact guard assist Bed to/from chair/wheelchair/BSC transfer type:: Lateral/scoot transfer Step pivot transfers: Contact guard assist General transfer comment: Pt performed sit to stand with min guard at EOB in order to laterally step L toward foot of bed and return to sitting to allow better access for OT to address RUE EOB.    Ambulation / Gait / Stairs / Wheelchair Mobility  Ambulation/Gait Ambulation/Gait assistance: Clinical research associate (Feet): 100 Feet Assistive device: Rolling walker (2 wheels) Gait Pattern/deviations: Decreased step length - left, Decreased stance time - right, Decreased dorsiflexion - right, Knees buckling, Steppage, Decreased weight shift to right General Gait Details: Amb ~100' with RW and CGA 2/2 R knee instability and balance impairments; noted increase in RLE gait deviations by the end of ambulation; pt takes wide turns with RW and requires VCs to correct. Gait velocity: Decreased    Posture / Balance Dynamic Sitting Balance Sitting balance - Comments: No LOB seated in recliner Balance Overall balance assessment: Needs assistance Sitting-balance support: No upper extremity supported, Feet supported Sitting balance-Leahy Scale: Good Sitting balance - Comments: No LOB seated in recliner Standing balance support: Single extremity supported Standing balance-Leahy Scale: Fair Standing balance comment: min guard for L lateral stepping toward foot of bed, back of legs lightly pressed against EOB for stability High level balance activites: Side stepping High Level Balance Comments: min guard at bedside    Special needs/care  consideration NA   Previous Home Environment (from acute therapy documentation) Living Arrangements: Children (friend's children) Available Help at Discharge: Family, Available PRN/intermittently Type of Home: Mobile home Home Layout: One level Home Access: Stairs to enter Entrance Stairs-Rails: None Entrance Stairs-Number of Steps: 4 Bathroom Shower/Tub: Engineer, manufacturing systems: Handicapped height Bathroom Accessibility: Yes How Accessible: Accessible via walker Home Care Services: No  Discharge Living Setting Plans for Discharge Living Setting: Apartment (daughter, Chasity's house) Type of Home at Discharge: Apartment Discharge Home Layout: One level Discharge Home Access: Stairs to enter Entrance Stairs-Rails: Right Entrance Stairs-Number of Steps: 10 Discharge Bathroom Shower/Tub: Tub/shower unit Discharge Bathroom Toilet: Standard Discharge Bathroom Accessibility: Yes How Accessible: Accessible via walker Does the patient have any problems obtaining your medications?: No  Social/Family/Support Systems Anticipated Caregiver: Drucella Och (daughter)  and Tammy (aunt) Anticipated Caregiver's Contact Information: Chasity: [phone] Caregiver Availability: 24/7 Discharge Plan Discussed with Primary Caregiver: Yes Is Caregiver In Agreement with Plan?: Yes Does Caregiver/Family have Issues with Lodging/Transportation while Pt is in Rehab?: No  Goals Patient/Family Goal for Rehab: Supervision: PT/OT/ST Expected length of stay: 7-10 days Pt/Family Agrees to Admission and willing to participate: Yes Program Orientation Provided & Reviewed with Pt/Caregiver Including Roles  & Responsibilities: Yes  Decrease burden of Care through IP rehab admission: NA  Possible need for SNF placement upon discharge: Not anticipated  Patient Condition: I have reviewed medical records from [facility], spoken with CM, and patient and daughter. I discussed via  phone for inpatient rehabilitation assessment.  Patient will benefit from ongoing PT, OT, and SLP, can actively participate in 3 hours of therapy a day 5 days of the week, and can make measurable gains during the admission.  Patient will also benefit from the coordinated team approach during an Inpatient Acute Rehabilitation admission.  The patient will receive intensive therapy as well as Rehabilitation physician, nursing, social worker, and care management interventions.  Due to safety, disease management, medication administration, pain management, and patient education the patient requires 24 hour a day rehabilitation nursing.  The patient is currently *** with mobility and basic ADLs.  Discharge setting and therapy post discharge at home with home health is anticipated.  Patient has agreed to participate in the Acute Inpatient Rehabilitation Program and will admit {Time; today/tomorrow:10263}.  Preadmission Screen Completed By:  Domingo Pulse, 05/30/2023 4:15 PM ______________________________________________________________________   Discussed status with Dr. Marland Kitchen on *** at *** and received approval for admission today.  Admission Coordinator:  Domingo Pulse, CCC-SLP, time ***/Date ***   Assessment/Plan: Diagnosis: Does the need for close, 24 hr/day Medical supervision in concert with the patient's rehab needs make it unreasonable for this patient to be served in a less intensive setting? {yes_no_potentially:3041433} Co-Morbidities requiring supervision/potential complications: *** Due to {due XL:2440102}, does the patient require 24 hr/day rehab nursing? {yes_no_potentially:3041433} Does the patient require coordinated care of a physician, rehab nurse, PT, OT, and SLP to address physical and functional deficits in the context of the above medical diagnosis(es)? {yes_no_potentially:3041433} Addressing deficits in the following areas: {deficits:3041436} Can the patient actively  participate in an intensive therapy program of at least 3 hrs of therapy 5 days a week? {yes_no_potentially:3041433} The potential for patient to make measurable gains while on inpatient rehab is {potential:3041437} Anticipated functional outcomes upon discharge from inpatient rehab: {functional outcomes:304600100} PT, {functional outcomes:304600100} OT, {functional outcomes:304600100} SLP Estimated rehab length of stay to reach the above functional goals is: *** Anticipated discharge destination: {anticipated dc setting:21604} 10. Overall Rehab/Functional Prognosis: {potential:3041437}   MD Signature: ***

## 2023-05-30 NOTE — Progress Notes (Signed)
Occupational Therapy Treatment Patient Details Name: Rachael Aguilar MRN: [id number] DOB: October 19, 1965 Today's Date: 05/30/2023   History of present illness Pt is a 57 year old female presented to ED on 10/19 with chief concerns of R-sided weakness and slurred speech for 2 days. Pt with history of hypertension and GERD.  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   OT comments  Pt seated EOB upon OT arrival and agreeable to participate in OT session.  Focus this date on RUE AROM/AAROM, functional reaching patterns forward, laterally, and to target points on body, as well as sustaining R hand grasp around a water bottle; see below for details.  Pt verbalized that she can tell that her R arm is improving.  OT provided instruction in engaging the R arm into tasks as often as possible, and provided examples of making attempts at using R hand to self feed finger foods, drinking from water bottle with support of L hand as needed, reaching to pull up covers on bed, and advised on continued attempts at RUE AROM/AAROM exercises as performed today.  Pt continues to present with mild-moderate apraxia throughout the RUE, noting improved motor planning when cued to maintain visual attention on RUE during movement attempts.  Session ended to allow transition to SLP tx session.  Left pt sitting EOB with SLP present.  D/c recommendation for inpatient rehab remains appropriate.  Will continue to follow in the acute setting to maximize indep and safety with ADLs and to increase engagement of R dominant arm into daily tasks.        If plan is discharge home, recommend the following:  Assistance with cooking/housework;Assist for transportation;Help with stairs or ramp for entrance;A little help with walking and/or transfers;A lot of help with bathing/dressing/bathroom;Direct supervision/assist for medications management   Equipment Recommendations   Other (comment) (defer to next venue of care)    Recommendations for Other Services      Precautions / Restrictions Precautions Precautions: Fall Restrictions Weight Bearing Restrictions: No       Mobility Bed Mobility               General bed mobility comments: NT this date as pt was received sitting EOB and left with SLP sitting EOB. Patient Response: Cooperative  Transfers Overall transfer level: Needs assistance   Transfers: Sit to/from Stand Sit to Stand: Contact guard assist     Step pivot transfers: Contact guard assist     General transfer comment: Pt performed sit to stand with min guard at EOB in order to laterally step L toward foot of bed and return to sitting to allow better access for OT to address RUE EOB.     Balance Overall balance assessment: Needs assistance Sitting-balance support: No upper extremity supported, Feet supported Sitting balance-Leahy Scale: Good     Standing balance support: Single extremity supported Standing balance-Leahy Scale: Fair Standing balance comment: min guard for L lateral stepping toward foot of bed, back of legs lightly pressed against EOB for stability             High level balance activites: Side stepping High Level Balance Comments: min guard at bedside           ADL either performed or assessed with clinical judgement   ADL Overall ADL's : Needs assistance/impaired Eating/Feeding: Minimal assistance Eating/Feeding Details (indicate cue type and reason): CGA to maintain grasp of water bottle in R hand and bring  to mouth x3 sips.  Pt able to stabilize bottle on R thigh with R hand while opening top with L hand.  Encouraged pt begin attempts at eating finger foods with R hand for lunch and supper this date.                                        Extremity/Trunk Assessment Upper Extremity Assessment Upper Extremity Assessment: Right hand dominant;RUE deficits/detail RUE Deficits /  Details: R shoulder shoulder/abd 2+, elbow flex 3+, elbow ext 3, sup/pron 3, wrist flex/ext 3 RUE Coordination: decreased fine motor (mild-moderate RUE apraxia; able to make hand signals of numbers 1-5 in R hand with extra time and vc)   Lower Extremity Assessment Lower Extremity Assessment: Defer to PT evaluation   Cervical / Trunk Assessment Cervical / Trunk Assessment: Normal    Vision Baseline Vision/History: 1 Wears glasses Patient Visual Report: No change from baseline     Perception     Praxis Praxis Praxis: Impaired Praxis Impairment Details: Initiation;Motor planning;Limb apraxia    Cognition Arousal: Alert Behavior During Therapy: WFL for tasks assessed/performed                                   General Comments: AO x4        Exercises General Exercises - Upper Extremity Shoulder Flexion: AAROM, Right, 10 reps, Seated (and AAROM for ER (hand behind head)/IR hand behind back) x10 reps each) Shoulder ABduction: AAROM, Right, 10 reps, Seated Shoulder Horizontal ADduction: AROM, Right, 10 reps, Seated Elbow Flexion: AROM, 10 reps, Other (comment), Seated (OT providing light resistance through partial range) Elbow Extension: AROM, Right, 10 reps, Other (comment) (OT stabilized R shoulder into flexion to allow active elbow ext against gravity) Wrist Flexion: AROM, Right, 10 reps, Seated (held water bottle (only 10% full) to challenge sustained grasp) Wrist Extension: AROM, Right, 10 reps, Seated (held water bottle (only 10% full) to challenge sustained grasp) Digit Composite Flexion: AROM, Right, Seated Composite Extension: AROM, Right, 10 reps Other Exercises Other Exercises: AROM R forearm pron/sup x10 reps each while maintaining grasp of water bottle (bottle 10% full)           General Comments      Pertinent Vitals/ Pain       Pain Assessment Pain Assessment: No/denies pain  Home Living                                           Prior Functioning/Environment              Frequency  Min 1X/week        Progress Toward Goals  OT Goals(current goals can now be found in the care plan section)  Progress towards OT goals: Progressing toward goals  Acute Rehab OT Goals Patient Stated Goal: to return to PLOF OT Goal Formulation: With patient Time For Goal Achievement: 06/12/23 Potential to Achieve Goals: Good  Plan                       AM-PAC OT "6 Clicks" Daily Activity     Outcome Measure   Help from another person eating meals?: A Little Help from another person taking care  of personal grooming?: A Little Help from another person toileting, which includes using toliet, bedpan, or urinal?: A Little Help from another person bathing (including washing, rinsing, drying)?: A Lot Help from another person to put on and taking off regular upper body clothing?: A Lot Help from another person to put on and taking off regular lower body clothing?: A Lot 6 Click Score: 15    End of Session    OT Visit Diagnosis: Other abnormalities of gait and mobility (R26.89);Muscle weakness (generalized) (M62.81);History of falling (Z91.81)   Activity Tolerance Patient tolerated treatment well   Patient Left in bed;Other (comment) (sitting EOB with SLP present)   Nurse Communication          Time: 9629-5284 OT Time Calculation (min): 22 min  Charges: OT General Charges $OT Visit: 1 Visit OT Treatments $Neuromuscular Re-education: 8-22 mins  Danelle Earthly, MS, OTR/L  Otis Dials 05/30/2023, 1:13 PM

## 2023-05-30 NOTE — Progress Notes (Signed)
Progress Note   Patient: Rachael Aguilar WGN:[id number] DOB: [birth year] DOA: 05/28/2023     1 DOS: the patient was seen and examined on 05/30/2023   Brief hospital course: [name] is a 57 year old female with history of hypertension, GERD, who presents emergency department for chief concerns of right-sided weakness and slurred speech for 2 days. Patient had multiple falls and near falls because of that over the past 2 days.  Vitals in the ED showed temperature of 98.4, respiration rate 20, heart rate of 102, blood pressure 192/108, SpO2 of 99% on room air.  Serum sodium is 140, potassium 3.0, chloride 102, bicarb 26, BUN of 10, serum creatinine 0.88, nonfasting blood glucose 101, EGFR greater than 60, WBC 6.1, hemoglobin 14.8, platelets of 296.  CT head wo contrast: Was read as subacute lacunar infarct involving the left caudate nucleus and anterior limb of the internal capsule.  Moderate chronic small vessel disease and old right basal ganglia lacunar infarct.  She received full dose of aspirin in ED.  Neurology was consulted. Patient did not receive tPA due to being out of window.  10/20: Blood pressure mildly elevated at 158/102, lipid panel with LDL of 118 with goal of less than 70, potassium 3, magnesium 1.9, CK mildly elevated at 285 with history of fall.  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 CTA of head and neck no LVO.  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  PT is recommending CIR.  10/21:BP mildly elevated but improving.  Starting on losartan.  Pending CIR disposition   Assessment and Plan: * Stroke (cerebrum) (HCC) Subacute lacunar infarct involving the left caudate nucleus and anterior limb of the internal capsule.  MRI  with a small acute left pontine infarct, CTA negative for LVO PT is recommending CIR. echocardiogram done with pending results -Neurology is recommending DAPT for 3 weeks with aspirin and Plavix. -Continue with statin -Continue supportive care  Essential hypertension Blood pressure remained elevated. -Continue increased dose of amlodipine -Adding losartan at 25 mg daily -Patient was not taking triamterene-hydrochlorothiazide at home -As needed hydralazine  Hypokalemia Serum magnesium level is 1.9, potassium remained at 3.0 -Replace potassium and monitor  Grief Patient states she family friend recently passed away Patient tearful at bedside Counseled patient that she needs to take herself and not neglect her health   Subjective: Patient was sitting comfortably in chair when seen today.  No new deficit.  Prior deficit continue to improve.  She was interested going to CIR  Physical Exam: Vitals:   05/29/23 2028 05/29/23 2354 05/30/23 0513 05/30/23 1100  BP: (!) 152/99 (!) 138/90 (!) 139/99 (!) 149/138  Pulse: 98 96 94 (!) 105  Resp: 16 20 15 20   Temp: 97.9 F (36.6 C) 98.4 F (36.9 C) 98.1 F (36.7 C) 98.6 F (37 C)  TempSrc: Oral Oral  Oral  SpO2: 96% 91% 98% 99%  Weight:      Height:       General.  Well-developed lady, in no acute distress. Pulmonary.  Lungs clear bilaterally, normal respiratory effort. CV.  Regular rate and rhythm, no JVD, rub or murmur. Abdomen.  Soft, nontender, nondistended, BS positive. CNS.  Alert and oriented .  No focal neurologic deficit. Extremities.  No edema, no cyanosis, pulses intact and symmetrical. Psychiatry.  Judgment and insight appears normal.   Data Reviewed: Prior data reviewed  Family Communication: Talked with daughter on phone Disposition: Status is: Inpatient Remains inpatient appropriate because: Severity of illness  Planned Discharge Destination:  CIR  Time spent: 45 minutes  This record has been created using  Conservation officer, historic buildings. Errors have been sought and corrected,but may not always be located. Such creation errors do not reflect on the standard of care.   Author: Arnetha Courser, MD 05/30/2023 3:49 PM  For on call review [email or web address].

## 2023-05-30 NOTE — Assessment & Plan Note (Signed)
Blood pressure remained elevated. -Continue increased dose of amlodipine -Adding losartan at 25 mg daily -Patient was not taking triamterene-hydrochlorothiazide at home -As needed hydralazine

## 2023-05-30 NOTE — Progress Notes (Signed)
Physical Therapy Treatment Patient Details Name: Rachael Aguilar MRN: [id number] DOB: 11/05/1965 Today's Date: 05/30/2023   History of Present Illness Pt is a 57 year old female presented to ED on 10/19 with chief concerns of R-sided weakness and slurred speech for 2 days. Pt with history of hypertension and GERD.  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    PT Comments  Pt in good spirits upon entry, A&O, and denies any pain t/o the session. Pt states that she got up to go to the bathroom by herself  this morning, "I almost didn't make it, but I did it." Provided education regarding importance of calling for the nurse before mobility to prevent falls- pt is receptive. CGA for all mobility with RW, but limited 2/2 significant R knee instability during amb. Pt amb 100' with RW, with gait deviations including steppage pattern and decreased heel strike on the R, decreased stance time on the R, decreased step length on the L, and notable hyperextension on the R during stance phase. All gait deviations exacerbated during the second half of the walk d/t R hemiparesis, instability, and increased fatigue with exertion. Pt also verbally notes a difference in her walking compared to baseline. MMT re-assessed in sitting 2/2 increased functional mobility vs previous session (RLE grossly 4-/5, compared to 3+/5 on eval). Pt would benefit from continued skilled PT to address hemiparesis, instability, endurance, and balance deficits and maximize return to functioning baseline.   If plan is discharge home, recommend the following: A little help with walking and/or transfers;A little help with bathing/dressing/bathroom;Help with stairs or ramp for entrance;Assist for transportation;Assistance with cooking/housework   Can travel by private vehicle      yes  Equipment Recommendations  None recommended by PT    Recommendations for Other  Services       Precautions / Restrictions Precautions Precautions: Fall Restrictions Weight Bearing Restrictions: No     Mobility  Bed Mobility Overal bed mobility: Needs Assistance Bed Mobility: Supine to Sit     Supine to sit: Supervision     General bed mobility comments: Supervision 2/2 impulsivity with mobility    Transfers Overall transfer level: Needs assistance Equipment used: Rolling walker (2 wheels) Transfers: Sit to/from Stand Sit to Stand: Contact guard assist           General transfer comment: CGA with VCs for correct placement of hands    Ambulation/Gait Ambulation/Gait assistance: Contact guard assist Gait Distance (Feet): 100 Feet Assistive device: Rolling walker (2 wheels) Gait Pattern/deviations: Decreased step length - left, Decreased stance time - right, Decreased dorsiflexion - right, Steppage, Decreased weight shift to right Gait velocity: Decreased     General Gait Details: Amb ~100' with RW and CGA 2/2 R knee instability and balance impairments; noted increase in RLE gait deviations by the end of ambulation; pt takes wide turns with RW and requires VCs to correct.   Stairs             Wheelchair Mobility     Tilt Bed    Modified Rankin (Stroke Patients Only)       Balance Overall balance assessment: Needs assistance Sitting-balance support: No upper extremity supported, Feet supported Sitting balance-Leahy Scale: Good     Standing balance support: Bilateral upper extremity supported, During functional activity Standing balance-Leahy Scale: Good Standing balance comment: Unsteadiness with amb 2/2 R knee instability and weakness  Cognition Arousal: Alert Behavior During Therapy: WFL for tasks assessed/performed Overall Cognitive Status: Impaired/Different from baseline                                          Exercises      General Comments         Pertinent Vitals/Pain Pain Assessment Pain Assessment: No/denies pain    Home Living                          Prior Function            PT Goals (current goals can now be found in the care plan section) Acute Rehab PT Goals Patient Stated Goal: get back to normal PT Goal Formulation: With patient Time For Goal Achievement: 06/12/23 Potential to Achieve Goals: Good Progress towards PT goals: Goals met and updated - see care plan    Frequency    Min 1X/week      PT Plan      Co-evaluation              AM-PAC PT "6 Clicks" Mobility   Outcome Measure  Help needed turning from your back to your side while in a flat bed without using bedrails?: None Help needed moving from lying on your back to sitting on the side of a flat bed without using bedrails?: A Little Help needed moving to and from a bed to a chair (including a wheelchair)?: A Little Help needed standing up from a chair using your arms (e.g., wheelchair or bedside chair)?: A Little Help needed to walk in hospital room?: A Little Help needed climbing 3-5 steps with a railing? : A Lot 6 Click Score: 18    End of Session Equipment Utilized During Treatment: Gait belt Activity Tolerance: Patient tolerated treatment well Patient left: in chair;with chair alarm set;with call bell/phone within reach Nurse Communication: Mobility status (Removal of pure wick prior to amb) PT Visit Diagnosis: Unsteadiness on feet (R26.81);Hemiplegia and hemiparesis;Muscle weakness (generalized) (M62.81);Other abnormalities of gait and mobility (R26.89) Hemiplegia - Right/Left: Right Hemiplegia - dominant/non-dominant: Dominant Hemiplegia - caused by: Cerebral infarction     Time: 0932-0950 PT Time Calculation (min) (ACUTE ONLY): 18 min  Charges:    $Gait Training: 8-22 mins PT General Charges $$ ACUTE PT VISIT: 1 Visit                       Shauna Hugh, SPT 05/30/2023, 10:55 AM

## 2023-05-30 NOTE — Assessment & Plan Note (Signed)
Subacute lacunar infarct involving the left caudate nucleus and anterior limb of the internal capsule. Neurology has been consulted and we appreciate further recommendations Fasting lipid and A1c ordered Patient is outside window for permissive hypertension  Frequent neuro vascular checks Per nursing, patient has passed bedside swallow study PT, OT start tomorrow Fall precaution Admit to telemetry medical, observation

## 2023-05-30 NOTE — TOC Progression Note (Signed)
Transition of Care [facility]) - Progression Note    Patient Details  Name: Rachael Aguilar MRN: [id number] Date of Birth: Jun 10, 1966  [facility] CM/SW Contact  Garret Reddish, RN Phone Number: 05/30/2023, 9:42 PM  Clinical Narrative:    Chart reviewed.  PT has recommended AIR for Rachael Aguilar.  Patient's daughter has confirmed with CIR that patient will have 24 hour care on discharge from rehab.    CIR to start paperwork for possible admission.    TOC will continue to follow progress of patient.          Expected Discharge Plan and Services                                               Social Determinants of Health (SDOH) Interventions SDOH Screenings   Food Insecurity: No Food Insecurity (05/28/2023)  Housing: Low Risk  (05/28/2023)  Transportation Needs: No Transportation Needs (05/28/2023)  Utilities: Not At Risk (05/28/2023)  Alcohol Screen: Low Risk  (04/21/2021)  Depression (PHQ2-9): Low Risk  (04/21/2021)  Tobacco Use: Low Risk  (05/29/2023)    Readmission Risk Interventions     No data to display

## 2023-05-30 NOTE — Evaluation (Signed)
Speech Language Pathology Evaluation Patient Details Name: Rachael Aguilar MRN: [id number] DOB: [birth year] Today's Date: 05/30/2023 Time: 1212-1228 SLP Time Calculation (min) (ACUTE ONLY): 16 min  Problem List:  Patient Active Problem List   Diagnosis Date Noted   Stroke (HCC) 05/29/2023   Stroke (cerebrum) (HCC) 05/28/2023   Hypokalemia 05/28/2023   Grief 05/28/2023   Diverticulosis 11/19/2020   Hemorrhagic shock (HCC) 11/19/2020   Acute GI bleeding 11/09/2020   Acute cholecystitis 04/01/2016   RUQ pain    Anxiety and depression 01/14/2015   Gastro-esophageal reflux disease without esophagitis 01/14/2015   Essential hypertension 01/14/2015   Past Medical History:  Past Medical History:  Diagnosis Date   Bipolar depression (HCC)    Hypertension    Lower GI bleed    Past Surgical History:  Past Surgical History:  Procedure Laterality Date   CHOLECYSTECTOMY N/A 04/02/2016   Procedure: LAPAROSCOPIC CHOLECYSTECTOMY;  Surgeon: Leafy Ro, MD;  Location: [facility] ORS;  Service: General;  Laterality: N/A;   DILATION AND CURETTAGE OF UTERUS     ESOPHAGOGASTRODUODENOSCOPY N/A 11/09/2020   Procedure: ESOPHAGOGASTRODUODENOSCOPY (EGD);  Surgeon: Toledo, Boykin Nearing, MD;  Location: [facility] ENDOSCOPY;  Service: Gastroenterology;  Laterality: N/A;   FLEXIBLE SIGMOIDOSCOPY N/A 11/09/2020   Procedure: FLEXIBLE SIGMOIDOSCOPY;  Surgeon: Toledo, Boykin Nearing, MD;  Location: [facility] ENDOSCOPY;  Service: Gastroenterology;  Laterality: N/A;   HPI:  Pt is a 57 year old female presented to ED on 10/19 with chief concerns of R-sided weakness and slurred speech for 2 days. Pt with history of hypertension and GERD.  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     Moderate chronic small vessel disease and old right basal ganglia  lacunar infarct." MRI brain, 10/19: "1. Acute infarct in the left pons.  2. 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 Assessment completed via informal means and completion of SLUMS. Pt scored 10/30 on SLUMS. Pt with impaired attention, memory, problem solving, executive functioning, and insight. Pt with s/sx mild-moderate dysarthria c/b reduced vocal loudness and imprecise articulation. Pt benefits from verbal cues for slow speech rate and increased vocal loundness. Pt would benefit from intensive SLP services for current deficits. SLP to f/u per POC while pt in house.    SLP Assessment  SLP Recommendation/Assessment: Patient needs continued Speech Lanaguage Pathology Services SLP Visit Diagnosis: Dysarthria and anarthria (R47.1);Cognitive communication deficit (R41.841)    Recommendations for follow up therapy are one component of a multi-disciplinary discharge planning process, led by the attending physician.  Recommendations may be updated based on patient status, additional functional criteria and insurance authorization.    Follow Up Recommendations  Acute inpatient rehab (3hours/day)    Assistance Recommended at Discharge  Frequent or constant Supervision/Assistance  Functional Status Assessment Patient has had a recent decline in their functional status and demonstrates the ability to make significant improvements in function in a reasonable and predictable amount of time.  Frequency and Duration min 2x/week  2 weeks      SLP Evaluation Cognition  Overall Cognitive Status: Impaired/Different from baseline Arousal/Alertness: Awake/alert Orientation Level: Oriented X4 Attention: Sustained;Selective Selective Attention: Impaired Memory: Impaired Awareness: Impaired Awareness Impairment: Intellectual impairment Problem Solving: Impaired Problem Solving Impairment: Verbal complex Executive Function: Reasoning;Self  Monitoring;Self Correcting Reasoning: Impaired Self Monitoring: Impaired Self Correcting: Impaired  Safety/Judgment: Impaired       Comprehension  Auditory Comprehension Overall Auditory Comprehension: Appears within functional limits for tasks assessed    Expression Expression Primary Mode of Expression: Verbal Verbal Expression Overall Verbal Expression: Appears within functional limits for tasks assessed   Oral / Motor  Oral Motor/Sensory Function Overall Oral Motor/Sensory Function: Within functional limits Motor Speech Overall Motor Speech: Impaired Respiration: Within functional limits Phonation: Low vocal intensity Resonance: Within functional limits Articulation: Impaired Level of Impairment: Word Intelligibility: Intelligibility reduced Word: 50-74% accurate           Clyde Canterbury, M.S., CCC-SLP Speech-Language Pathologist [facility] [phone] (ASCOM)  [name] 05/30/2023, 1:29 PM

## 2023-05-30 NOTE — Progress Notes (Addendum)
Inpatient Rehab Admissions Coordinator:  Attempted to contact pt's daughter Chasity to verify dispo. Left a message. Awaiting return call.  1422: Pt's daughter Chasity returned call. She informed AC that she and her aunt Babette Relic will be able to provide 24/7 support for pt after discharge.    Wolfgang Phoenix, MS, CCC-SLP Admissions Coordinator [phone]

## 2023-05-31 ENCOUNTER — Encounter (HOSPITAL_COMMUNITY): Payer: Self-pay | Admitting: Physical Medicine & Rehabilitation

## 2023-05-31 ENCOUNTER — Inpatient Hospital Stay (HOSPITAL_COMMUNITY)
Admission: AD | Admit: 2023-05-31 | Discharge: 2023-06-06 | DRG: 057 | Disposition: A | Discharge status: Home / Self Care | Payer: MEDICAID | Source: Other Acute Inpatient Hospital | Attending: Physical Medicine & Rehabilitation | Admitting: Physical Medicine & Rehabilitation

## 2023-05-31 ENCOUNTER — Other Ambulatory Visit: Payer: Self-pay

## 2023-05-31 DIAGNOSIS — Z7902 Long term (current) use of antithrombotics/antiplatelets: Secondary | ICD-10-CM

## 2023-05-31 DIAGNOSIS — I10 Essential (primary) hypertension: Secondary | ICD-10-CM | POA: Diagnosis present

## 2023-05-31 DIAGNOSIS — K219 Gastro-esophageal reflux disease without esophagitis: Secondary | ICD-10-CM

## 2023-05-31 DIAGNOSIS — Z5971 Insufficient health insurance coverage: Secondary | ICD-10-CM

## 2023-05-31 DIAGNOSIS — Z794 Long term (current) use of insulin: Secondary | ICD-10-CM

## 2023-05-31 DIAGNOSIS — F419 Anxiety disorder, unspecified: Secondary | ICD-10-CM

## 2023-05-31 DIAGNOSIS — R059 Cough, unspecified: Secondary | ICD-10-CM | POA: Diagnosis not present

## 2023-05-31 DIAGNOSIS — Z7982 Long term (current) use of aspirin: Secondary | ICD-10-CM

## 2023-05-31 DIAGNOSIS — I69351 Hemiplegia and hemiparesis following cerebral infarction affecting right dominant side: Principal | ICD-10-CM

## 2023-05-31 DIAGNOSIS — R278 Other lack of coordination: Secondary | ICD-10-CM | POA: Diagnosis present

## 2023-05-31 DIAGNOSIS — F32A Depression, unspecified: Secondary | ICD-10-CM

## 2023-05-31 DIAGNOSIS — E119 Type 2 diabetes mellitus without complications: Secondary | ICD-10-CM | POA: Diagnosis present

## 2023-05-31 DIAGNOSIS — Z79899 Other long term (current) drug therapy: Secondary | ICD-10-CM

## 2023-05-31 DIAGNOSIS — F39 Unspecified mood [affective] disorder: Secondary | ICD-10-CM

## 2023-05-31 DIAGNOSIS — Z634 Disappearance and death of family member: Secondary | ICD-10-CM

## 2023-05-31 DIAGNOSIS — I639 Cerebral infarction, unspecified: Principal | ICD-10-CM | POA: Diagnosis present

## 2023-05-31 DIAGNOSIS — E876 Hypokalemia: Secondary | ICD-10-CM | POA: Diagnosis present

## 2023-05-31 LAB — BASIC METABOLIC PANEL
Anion gap: 10 (ref 5–15)
BUN: 17 mg/dL (ref 6–20)
CO2: 25 mmol/L (ref 22–32)
Calcium: 9.4 mg/dL (ref 8.9–10.3)
Chloride: 106 mmol/L (ref 98–111)
Creatinine, Ser: 0.79 mg/dL (ref 0.44–1.00)
GFR, Estimated: >60 mL/min (ref >60)
Glucose, Bld: 117 mg/dL — ABNORMAL HIGH (ref 70–99)
Potassium: 3.9 mmol/L (ref 3.5–5.1)
Sodium: 141 mmol/L (ref 135–145)

## 2023-05-31 LAB — GLUCOSE, CAPILLARY: Glucose-Capillary: 157 mg/dL — ABNORMAL HIGH (ref 70–99)

## 2023-05-31 MED ORDER — LOSARTAN POTASSIUM 25 MG PO TABS: 25.0000 mg | ORAL_TABLET | Freq: Every day | ORAL | Status: DC

## 2023-05-31 MED ORDER — AMLODIPINE BESYLATE 10 MG PO TABS
10.0000 mg | ORAL_TABLET | Freq: Every day | ORAL | Status: DC
  Administered 2023-06-01 – 2023-06-06 (×6): 10 mg via ORAL
  Filled 2023-05-31 (×6): qty 1

## 2023-05-31 MED ORDER — LOSARTAN POTASSIUM 25 MG PO TABS
25.0000 mg | ORAL_TABLET | Freq: Every day | ORAL | Status: DC
  Administered 2023-06-01 – 2023-06-06 (×6): 25 mg via ORAL
  Filled 2023-05-31 (×6): qty 1

## 2023-05-31 MED ORDER — GUAIFENESIN-DM 100-10 MG/5ML PO SYRP
5.0000 mL | ORAL_SOLUTION | Freq: Four times a day (QID) | ORAL | Status: DC | PRN
Start: 2023-05-31 — End: 2023-06-06
  Administered 2023-06-01 – 2023-06-03 (×4): 10 mL via ORAL
  Filled 2023-05-31 (×4): qty 10

## 2023-05-31 MED ORDER — INSULIN ASPART 100 UNIT/ML IJ SOLN
0.0000 [IU] | Freq: Three times a day (TID) | INTRAMUSCULAR | Status: DC
  Administered 2023-06-01 – 2023-06-04 (×9): 1 [IU] via SUBCUTANEOUS
  Administered 2023-06-04: 2 [IU] via SUBCUTANEOUS
  Administered 2023-06-04 – 2023-06-06 (×4): 1 [IU] via SUBCUTANEOUS

## 2023-05-31 MED ORDER — INSULIN ASPART 100 UNIT/ML IJ SOLN
0.0000 [IU] | Freq: Every day | INTRAMUSCULAR | Status: DC
  Administered 2023-06-02 – 2023-06-04 (×2): 2 [IU] via SUBCUTANEOUS

## 2023-05-31 MED ORDER — DM-GUAIFENESIN ER 30-600 MG PO TB12: 1.0000 | ORAL_TABLET | Freq: Two times a day (BID) | ORAL | Status: DC

## 2023-05-31 MED ORDER — PROCHLORPERAZINE 25 MG RE SUPP: 12.5000 mg | Freq: Four times a day (QID) | RECTAL | Status: DC | PRN

## 2023-05-31 MED ORDER — ALUM & MAG HYDROXIDE-SIMETH 200-200-20 MG/5ML PO SUSP: 30.0000 mL | ORAL | Status: DC | PRN

## 2023-05-31 MED ORDER — CLOPIDOGREL BISULFATE 75 MG PO TABS
75.0000 mg | ORAL_TABLET | Freq: Every day | ORAL | Status: DC
  Administered 2023-06-01 – 2023-06-06 (×6): 75 mg via ORAL
  Filled 2023-05-31 (×6): qty 1

## 2023-05-31 MED ORDER — DIPHENHYDRAMINE HCL 25 MG PO CAPS: 25.0000 mg | ORAL_CAPSULE | Freq: Four times a day (QID) | ORAL | Status: DC | PRN

## 2023-05-31 MED ORDER — PROCHLORPERAZINE EDISYLATE 10 MG/2ML IJ SOLN: 5.0000 mg | Freq: Four times a day (QID) | INTRAMUSCULAR | Status: DC | PRN

## 2023-05-31 MED ORDER — PROCHLORPERAZINE MALEATE 5 MG PO TABS: 5.0000 mg | ORAL_TABLET | Freq: Four times a day (QID) | ORAL | Status: DC | PRN

## 2023-05-31 MED ORDER — ENOXAPARIN SODIUM 40 MG/0.4ML IJ SOSY
40.0000 mg | PREFILLED_SYRINGE | INTRAMUSCULAR | Status: DC
  Administered 2023-05-31 – 2023-06-06 (×7): 40 mg via SUBCUTANEOUS
  Filled 2023-05-31 (×7): qty 0.4

## 2023-05-31 MED ORDER — CLOPIDOGREL BISULFATE 75 MG PO TABS: 75.0000 mg | ORAL_TABLET | Freq: Every day | ORAL | Status: DC

## 2023-05-31 MED ORDER — ATORVASTATIN CALCIUM 40 MG PO TABS
40.0000 mg | ORAL_TABLET | Freq: Every day | ORAL | Status: DC
  Administered 2023-06-01 – 2023-06-06 (×6): 40 mg via ORAL
  Filled 2023-05-31 (×6): qty 1

## 2023-05-31 MED ORDER — ATORVASTATIN CALCIUM 40 MG PO TABS: 40.0000 mg | ORAL_TABLET | Freq: Every day | ORAL | Status: DC

## 2023-05-31 MED ORDER — BISACODYL 10 MG RE SUPP: 10.0000 mg | Freq: Every day | RECTAL | Status: DC | PRN

## 2023-05-31 MED ORDER — ASPIRIN 81 MG PO TBEC
81.0000 mg | DELAYED_RELEASE_TABLET | Freq: Every day | ORAL | Status: DC
  Administered 2023-06-01 – 2023-06-06 (×6): 81 mg via ORAL
  Filled 2023-05-31 (×6): qty 1

## 2023-05-31 MED ORDER — BLOOD PRESSURE CONTROL BOOK
Freq: Once | Status: AC
  Filled 2023-05-31: qty 1

## 2023-05-31 MED ORDER — ASPIRIN 81 MG PO TBEC: 81.0000 mg | DELAYED_RELEASE_TABLET | Freq: Every day | ORAL | Status: DC

## 2023-05-31 MED ORDER — FLEET ENEMA RE ENEM: 1.0000 | ENEMA | Freq: Once | RECTAL | Status: DC | PRN

## 2023-05-31 MED ORDER — AMLODIPINE BESYLATE 10 MG PO TABS: 10.0000 mg | ORAL_TABLET | Freq: Every day | ORAL | Status: DC

## 2023-05-31 MED ORDER — ACETAMINOPHEN 325 MG PO TABS
325.0000 mg | ORAL_TABLET | ORAL | Status: DC | PRN
  Administered 2023-06-01 – 2023-06-05 (×2): 650 mg via ORAL
  Filled 2023-05-31 (×2): qty 2

## 2023-05-31 MED ORDER — TRAZODONE HCL 50 MG PO TABS
25.0000 mg | ORAL_TABLET | Freq: Every evening | ORAL | Status: DC | PRN
  Administered 2023-05-31 – 2023-06-05 (×6): 50 mg via ORAL
  Filled 2023-05-31 (×6): qty 1

## 2023-05-31 NOTE — H&P (Signed)
Physical Medicine and Rehabilitation Admission H&P    Chief Complaint  Patient presents with   Functional deficits due to stroke.     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  2D echo showed EF 55 to 60% with no wall abnormality, moderate concentric LV H and negative bubble study.  Dr. Otelia Limes recommended DAPT x 21 days followed by aspirin alone.  She continues to be limited by right-sided weakness affecting mobility and ADLs.  CIR recommended due to functional decline.   Review of Systems  Constitutional:  Negative for fever.  HENT:  Negative for hearing loss.   Eyes:  Negative for blurred vision.  Respiratory:  Negative for cough.   Cardiovascular:  Negative for chest pain.  Gastrointestinal:  Negative for heartburn and nausea.  Genitourinary:  Negative for dysuria.  Musculoskeletal:  Negative for myalgias.  Skin:  Negative for rash.  Neurological:  Positive for dizziness and focal weakness.  Psychiatric/Behavioral:  The patient is not nervous/anxious.      Past Medical History:  Diagnosis Date   Bipolar depression (HCC)    Hypertension    Lower GI bleed     Past Surgical History:  Procedure Laterality Date   CHOLECYSTECTOMY N/A 04/02/2016   Procedure: LAPAROSCOPIC CHOLECYSTECTOMY;  Surgeon: Leafy Ro, MD;  Location: [facility] ORS;  Service: General;  Laterality: N/A;   DILATION AND CURETTAGE OF UTERUS     ESOPHAGOGASTRODUODENOSCOPY N/A 11/09/2020   Procedure:  ESOPHAGOGASTRODUODENOSCOPY (EGD);  Surgeon: Toledo, Boykin Nearing, MD;  Location: [facility] ENDOSCOPY;  Service: Gastroenterology;  Laterality: N/A;   FLEXIBLE SIGMOIDOSCOPY N/A 11/09/2020   Procedure: FLEXIBLE SIGMOIDOSCOPY;  Surgeon: Toledo, Boykin Nearing, MD;  Location: [facility] ENDOSCOPY;  Service: Gastroenterology;  Laterality: N/A;    Family History  Problem Relation Age of Onset   Ovarian cancer Mother    Throat cancer Mother    Hypertension Mother    Lupus Maternal Grandmother    Rheum arthritis Maternal Aunt     Social History:  reports that she has never smoked. She has never used smokeless tobacco. She reports current alcohol use of about 10.0 standard drinks of alcohol per week. She reports that she does not currently use drugs.   Allergies: No Known Allergies   Medications Prior to Admission  Medication Sig Dispense Refill   [START ON 06/01/2023] amLODipine (NORVASC) 10 MG tablet Take 1 tablet (10 mg total) by mouth daily.     [START ON 06/01/2023] aspirin EC 81 MG tablet Take 1 tablet (81 mg total) by mouth daily. Swallow whole.     [START ON 06/01/2023] atorvastatin (LIPITOR) 40 MG tablet Take 1 tablet (40 mg total) by mouth daily.     [START ON 06/01/2023] clopidogrel (PLAVIX) 75 MG tablet Take 1 tablet (75 mg total) by mouth daily.     cyclobenzaprine (FLEXERIL) 10 MG tablet Take 1 tablet (10 mg total) by mouth 3 (three) times daily as needed for muscle spasms. 30 tablet 3   dextromethorphan-guaiFENesin (MUCINEX DM) 30-600 MG  12hr tablet Take 1 tablet by mouth 2 (two) times daily.     [START ON 06/01/2023] losartan (COZAAR) 25 MG tablet Take 1 tablet (25 mg total) by mouth daily.     mometasone (ELOCON) 0.1 % cream Apply topically daily. Apply Daily for rash (Patient not taking: Reported on 05/30/2023) 15 g 1      Home: Home Living Family/patient expects to be discharged to:: Private residence Living Arrangements: Children (friend's children) Available Help at Discharge: Family,  Available PRN/intermittently Type of Home: Mobile home Home Access: Stairs to enter Entrance Stairs-Number of Steps: 4 Entrance Stairs-Rails: None Home Layout: One level Bathroom Shower/Tub: Engineer, manufacturing systems: Handicapped height Bathroom Accessibility: Yes Home Equipment: None   Functional History: Prior Function Prior Level of Function : Independent/Modified Independent Mobility Comments: amb with no AD, a few falls prior to admission due to acute symptoms ADLs Comments: indep in ADL/IADL  Functional Status:  Mobility: Bed Mobility Overal bed mobility: Needs Assistance Bed Mobility: Supine to Sit Supine to sit: Supervision General bed mobility comments: NT this date as pt was received sitting EOB and left with SLP sitting EOB. Transfers Overall transfer level: Needs assistance Equipment used: Rolling walker (2 wheels) Transfers: Sit to/from Stand Sit to Stand: Contact guard assist Bed to/from chair/wheelchair/BSC transfer type:: Lateral/scoot transfer Step pivot transfers: Contact guard assist General transfer comment: Pt performed sit to stand with min guard at EOB in order to laterally step L toward foot of bed and return to sitting to allow better access for OT to address RUE EOB. Ambulation/Gait Ambulation/Gait assistance: Contact guard assist Gait Distance (Feet): 100 Feet Assistive device: Rolling walker (2 wheels) Gait Pattern/deviations: Decreased step length - left, Decreased stance time - right, Decreased dorsiflexion - right, Knees buckling, Steppage, Decreased weight shift to right General Gait Details: Amb ~100' with RW and CGA 2/2 R knee instability and balance impairments; noted increase in RLE gait deviations by the end of ambulation; pt takes wide turns with RW and requires VCs to correct. Gait velocity: Decreased    ADL: ADL Overall ADL's : Needs assistance/impaired Eating/Feeding: Minimal assistance Eating/Feeding Details (indicate cue  type and reason): CGA to maintain grasp of water bottle in R hand and bring to mouth x3 sips.  Pt able to stabilize bottle on R thigh with R hand while opening top with L hand.  Encouraged pt begin attempts at eating finger foods with R hand for lunch and supper this date. Toilet Transfer: Minimal assistance, Moderate assistance, +2 for physical assistance Toilet Transfer Details (indicate cue type and reason): simulated amb to BR > sit in recliner Toileting - Clothing Manipulation Details (indicate cue type and reason): Pt reports acute incontence Functional mobility during ADLs: Moderate assistance  Cognition: Cognition Overall Cognitive Status: Impaired/Different from baseline Arousal/Alertness: Awake/alert Orientation Level: Oriented X4 Attention: Sustained, Selective Selective Attention: Impaired Memory: Impaired Awareness: Impaired Awareness Impairment: Intellectual impairment Problem Solving: Impaired Problem Solving Impairment: Verbal complex Executive Function: Reasoning, Self Monitoring, Self Correcting Reasoning: Impaired Self Monitoring: Impaired Self Correcting: Impaired Safety/Judgment: Impaired Cognition Arousal: Alert Behavior During Therapy: WFL for tasks assessed/performed Overall Cognitive Status: Impaired/Different from baseline Area of Impairment: Problem solving Problem Solving: Slow processing General Comments: AO x4   Blood pressure (!) 138/90, pulse 90, temperature 98.3 F (36.8 C), resp. rate 18, height 5\' 4"  (1.626 m), weight 73.6 kg, last menstrual period 10/13/2020, SpO2 97%. Physical Exam Constitutional:      General: She is not in acute distress.  Appearance: She is not ill-appearing.  HENT:     Head: Normocephalic.     Right Ear: External ear normal.     Left Ear: External ear normal.     Nose: Nose normal.     Mouth/Throat:     Mouth: Mucous membranes are moist.  Eyes:     Pupils: Pupils are equal, round, and reactive to light.   Cardiovascular:     Rate and Rhythm: Normal rate and regular rhythm.     Heart sounds: No murmur heard.    No gallop.  Pulmonary:     Effort: Pulmonary effort is normal. No respiratory distress.     Breath sounds: No wheezing.  Abdominal:     General: Bowel sounds are normal. There is no distension.     Tenderness: There is no abdominal tenderness.  Musculoskeletal:        General: No swelling or tenderness.     Cervical back: Normal range of motion.  Skin:    General: Skin is warm and dry.  Neurological:     Mental Status: She is alert.     Comments: Alert and oriented x 3. Normal insight and awareness. Intact Memory. Normal language. Speech sl dysarthric. Cranial nerve exam with mild right central VII. MMT: RUE 4+/5. RLE 4-HF, KE and 4/5 ADF/PF. LUE 5/5. LLE 5/5. No focal sensory abnl. No abnl resting tone. DTR's 2+ right and 1+ left.    Psychiatric:        Mood and Affect: Mood normal.        Behavior: Behavior normal.     No results found for this or any previous visit (from the past 48 hour(s)). No results found.    Blood pressure (!) 138/90, pulse 90, temperature 98.3 F (36.8 C), resp. rate 18, height 5\' 4"  (1.626 m), weight 73.6 kg, last menstrual period 10/13/2020, SpO2 97%.  Medical Problem List and Plan: 1. 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  Antithrombotics: -DVT/anticoagulation:  Pharmaceutical: Lovenox  -antiplatelet therapy: DAPT x 3 weeks followed by ASA alone. 3. Pain Management: tylenol prn.  4. Mood/Behavior/Sleep:  LCSW to follow for evaluation and support.  --Melatonin prn for sleep.   -antipsychotic agents: N/A 5. Neuropsych/cognition: This patient is capable of making decisions on his own behalf. 6. Skin/Wound Care: Routine pressure relief measures.  7. Fluids/Electrolytes/Nutrition:  Monitor I/O. Check CMET in am  8. HTN: Monitor BP  TID--continue Norvasc and Cozaar (question compliant PTA)  --monitor for orthosatic changes.  10. T2DM: Hgb A1C- 6.6 and new diagonsis. Will monitor BS ac/hs and use SSI for now --change diet to Carb Mod and consult RD for diet education 11. GERD: Resume PPI 12. Intermittent hypokalemia: Recheck electrolytes in AM. 13.. Bereavement: Lost a friend recently.     Jacquelynn Cree, PA-C 05/31/2023

## 2023-05-31 NOTE — Progress Notes (Signed)
Inpatient Rehab Admissions Coordinator:    I have a CIR bed for this Pt. RN may call report to [phone].    Pt. To admit to CIR admit for an estimated 5-7 days with the goal of reaching supervision level and returning home with assist from family.   Megan Salon, MS, CCC-SLP Rehab Admissions Coordinator  [phone] (celll) [phone] (office)

## 2023-05-31 NOTE — Progress Notes (Signed)
Inpatient Rehabilitation Admission Medication Review by a Pharmacist  A complete drug regimen review was completed for this patient to identify any potential clinically significant medication issues.  High Risk Drug Classes Is patient taking? Indication by Medication  Antipsychotic Yes Compazine - nausea and vomiting  Anticoagulant Yes Lovenox - DVT ppx  Antibiotic No   Opioid No   Antiplatelet Yes Plavix, aspirin - stroke  Hypoglycemics/insulin No   Vasoactive Medication Yes Amlodipine, losartan - HTN  Chemotherapy No   Other Yes Trazadone - sleep Maalox - GERD Dulcolax, Fleets - constipation Benadryl - itching Robutussin DM - cough     Type of Medication Issue Identified Description of Issue Recommendation(s)  Drug Interaction(s) (clinically significant)     Duplicate Therapy     Allergy     No Medication Administration End Date     Incorrect Dose     Additional Drug Therapy Needed     Significant med changes from prior encounter (inform family/care partners about these prior to discharge).    Other       Clinically significant medication issues were identified that warrant physician communication and completion of prescribed/recommended actions by midnight of the next day:  No  Name of provider notified for urgent issues identified:   Provider Method of Notification:     Pharmacist comments:   Time spent performing this drug regimen review (minutes):  20   Jeanella Cara, PharmD, [place] Clinical Pharmacist Please see AMION for all Pharmacists' Contact Phone Numbers 05/31/2023, 2:15 PM

## 2023-05-31 NOTE — Progress Notes (Signed)
Patient ID: Rachael Aguilar, female   DOB: [birth year], 57 y.o.   MRN: [id number]  INPATIENT REHABILITATION ADMISSION NOTE   Arrival Method: [facility]     Mental Orientation: x4   Assessment: see flowsheet   Skin: CDI   IV'S: right AC   Pain: none reported   Tubes and Drains: n/a   Safety Measures:   Vital Signs: see flowsheet   Height and Weight: see flowsheet   Rehab Orientation: completed   Family: notified by patient

## 2023-05-31 NOTE — H&P (Signed)
Physical Medicine and Rehabilitation Admission H&P        Chief Complaint  Patient presents with   Functional deficits due to stroke.       HPI:  Rachael Aguilar. 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  2D echo showed EF 55 to 60% with no wall abnormality, moderate concentric LV H and negative bubble study.  Dr. Otelia Limes recommended DAPT x 21 days followed by aspirin alone.  She continues to be limited by right-sided weakness affecting mobility and ADLs.  CIR recommended due to functional decline.     Review of Systems  Constitutional:  Negative for fever.  HENT:  Negative for hearing loss.   Eyes:  Negative for blurred vision.  Respiratory:  Negative for cough.   Cardiovascular:  Negative for chest pain.  Gastrointestinal:  Negative for heartburn and nausea.  Genitourinary:  Negative for dysuria.  Musculoskeletal:  Negative for myalgias.  Skin:  Negative for rash.  Neurological:  Positive for dizziness and focal weakness.  Psychiatric/Behavioral:  The patient is not nervous/anxious.             Past Medical History:  Diagnosis Date   Bipolar depression (HCC)     Hypertension     Lower GI bleed                 Past Surgical History:  Procedure Laterality Date   CHOLECYSTECTOMY N/A 04/02/2016    Procedure: LAPAROSCOPIC CHOLECYSTECTOMY;  Surgeon: Leafy Ro, MD;  Location: [facility] ORS;  Service: General;  Laterality: N/A;   DILATION AND CURETTAGE OF UTERUS       ESOPHAGOGASTRODUODENOSCOPY N/A 11/09/2020     Procedure: ESOPHAGOGASTRODUODENOSCOPY (EGD);  Surgeon: Toledo, Boykin Nearing, MD;  Location: [facility] ENDOSCOPY;  Service: Gastroenterology;  Laterality: N/A;   FLEXIBLE SIGMOIDOSCOPY N/A 11/09/2020    Procedure: FLEXIBLE SIGMOIDOSCOPY;  Surgeon: Toledo, Boykin Nearing, MD;  Location: [facility] ENDOSCOPY;  Service: Gastroenterology;  Laterality: N/A;               Family History  Problem Relation Age of Onset   Ovarian cancer Mother     Throat cancer Mother     Hypertension Mother     Lupus Maternal Grandmother     Rheum arthritis Maternal Aunt            Social History:  reports that she has never smoked. She has never used smokeless tobacco. She reports current alcohol use of about 10.0 standard drinks of alcohol per week. She reports that she does not currently use drugs.     Allergies:  Allergies  No Known Allergies             Medications Prior to Admission  Medication Sig Dispense Refill   [START ON 06/01/2023] amLODipine (NORVASC) 10 MG tablet Take 1 tablet (10 mg total) by mouth daily.       [START ON 06/01/2023] aspirin EC 81 MG tablet Take 1 tablet (81 mg total) by mouth daily. Swallow whole.       [START  ON 06/01/2023] atorvastatin (LIPITOR) 40 MG tablet Take 1 tablet (40 mg total) by mouth daily.       [START ON 06/01/2023] clopidogrel (PLAVIX) 75 MG tablet Take 1 tablet (75 mg total) by mouth daily.       cyclobenzaprine (FLEXERIL) 10 MG tablet Take 1 tablet (10 mg total) by mouth 3 (three) times daily as needed for muscle spasms. 30 tablet 3   dextromethorphan-guaiFENesin (MUCINEX DM) 30-600 MG 12hr tablet Take 1 tablet by mouth 2 (two) times daily.       [START ON 06/01/2023] losartan (COZAAR) 25 MG tablet Take 1 tablet (25 mg total) by mouth daily.       mometasone (ELOCON) 0.1 % cream Apply topically daily. Apply Daily for rash (Patient not taking: Reported on 05/30/2023) 15 g 1              Home: Home Living Family/patient expects to be discharged to:: Private  residence Living Arrangements: Children (friend's children) Available Help at Discharge: Family, Available PRN/intermittently Type of Home: Mobile home Home Access: Stairs to enter Entrance Stairs-Number of Steps: 4 Entrance Stairs-Rails: None Home Layout: One level Bathroom Shower/Tub: Engineer, manufacturing systems: Handicapped height Bathroom Accessibility: Yes Home Equipment: None   Functional History: Prior Function Prior Level of Function : Independent/Modified Independent Mobility Comments: amb with no AD, a few falls prior to admission due to acute symptoms ADLs Comments: indep in ADL/IADL   Functional Status:  Mobility: Bed Mobility Overal bed mobility: Needs Assistance Bed Mobility: Supine to Sit Supine to sit: Supervision General bed mobility comments: NT this date as pt was received sitting EOB and left with SLP sitting EOB. Transfers Overall transfer level: Needs assistance Equipment used: Rolling walker (2 wheels) Transfers: Sit to/from Stand Sit to Stand: Contact guard assist Bed to/from chair/wheelchair/BSC transfer type:: Lateral/scoot transfer Step pivot transfers: Contact guard assist General transfer comment: Pt performed sit to stand with min guard at EOB in order to laterally step L toward foot of bed and return to sitting to allow better access for OT to address RUE EOB. Ambulation/Gait Ambulation/Gait assistance: Contact guard assist Gait Distance (Feet): 100 Feet Assistive device: Rolling walker (2 wheels) Gait Pattern/deviations: Decreased step length - left, Decreased stance time - right, Decreased dorsiflexion - right, Knees buckling, Steppage, Decreased weight shift to right General Gait Details: Amb ~100' with RW and CGA 2/2 R knee instability and balance impairments; noted increase in RLE gait deviations by the end of ambulation; pt takes wide turns with RW and requires VCs to correct. Gait velocity: Decreased   ADL: ADL Overall ADL's :  Needs assistance/impaired Eating/Feeding: Minimal assistance Eating/Feeding Details (indicate cue type and reason): CGA to maintain grasp of water bottle in R hand and bring to mouth x3 sips.  Pt able to stabilize bottle on R thigh with R hand while opening top with L hand.  Encouraged pt begin attempts at eating finger foods with R hand for lunch and supper this date. Toilet Transfer: Minimal assistance, Moderate assistance, +2 for physical assistance Toilet Transfer Details (indicate cue type and reason): simulated amb to BR > sit in recliner Toileting - Clothing Manipulation Details (indicate cue type and reason): Pt reports acute incontence Functional mobility during ADLs: Moderate assistance   Cognition: Cognition Overall Cognitive Status: Impaired/Different from baseline Arousal/Alertness: Awake/alert Orientation Level: Oriented X4 Attention: Sustained, Selective Selective Attention: Impaired Memory: Impaired Awareness: Impaired Awareness Impairment: Intellectual impairment Problem Solving: Impaired Problem Solving Impairment: Verbal complex Executive Function: Reasoning, Self  Monitoring, Self Correcting Reasoning: Impaired Self Monitoring: Impaired Self Correcting: Impaired Safety/Judgment: Impaired Cognition Arousal: Alert Behavior During Therapy: WFL for tasks assessed/performed Overall Cognitive Status: Impaired/Different from baseline Area of Impairment: Problem solving Problem Solving: Slow processing General Comments: AO x4     Blood pressure (!) 138/90, pulse 90, temperature 98.3 F (36.8 C), resp. rate 18, height 5\' 4"  (1.626 m), weight 73.6 kg, last menstrual period 10/13/2020, SpO2 97%. Physical Exam Constitutional:      General: She is not in acute distress.    Appearance: She is not ill-appearing.  HENT:     Head: Normocephalic.     Right Ear: External ear normal.     Left Ear: External ear normal.     Nose: Nose normal.     Mouth/Throat:     Mouth:  Mucous membranes are moist.  Eyes:     Pupils: Pupils are equal, round, and reactive to light.  Cardiovascular:     Rate and Rhythm: Normal rate and regular rhythm.     Heart sounds: No murmur heard.    No gallop.  Pulmonary:     Effort: Pulmonary effort is normal. No respiratory distress.     Breath sounds: No wheezing.  Abdominal:     General: Bowel sounds are normal. There is no distension.     Tenderness: There is no abdominal tenderness.  Musculoskeletal:        General: No swelling or tenderness.     Cervical back: Normal range of motion.  Skin:    General: Skin is warm and dry.  Neurological:     Mental Status: She is alert.     Comments: Alert and oriented x 3. Normal insight and awareness. Intact Memory. Normal language. Speech sl dysarthric. Cranial nerve exam with mild right central VII. MMT: RUE 4+/5. RLE 4-HF, KE and 4/5 ADF/PF. LUE 5/5. LLE 5/5. No focal sensory abnl. No abnl resting tone. DTR's 2+ right and 1+ left.    Psychiatric:        Mood and Affect: Mood normal.        Behavior: Behavior normal.        Lab Results Last 48 Hours  No results found for this or any previous visit (from the past 48 hour(s)).   Imaging Results (Last 48 hours)  No results found.         Blood pressure (!) 138/90, pulse 90, temperature 98.3 F (36.8 C), resp. rate 18, height 5\' 4"  (1.626 m), weight 73.6 kg, last menstrual period 10/13/2020, SpO2 97%.   Medical Problem List and Plan: 1. 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  Antithrombotics: -DVT/anticoagulation:  Pharmaceutical: Lovenox             -antiplatelet therapy: DAPT x 3 weeks followed by ASA alone. 3. Pain Management: tylenol prn.  4. Mood/Behavior/Sleep:  LCSW to follow for evaluation and support.             --Melatonin prn for sleep.              -antipsychotic agents: N/A 5.  Neuropsych/cognition: This patient is capable of making decisions on his own behalf. 6. Skin/Wound Care: Routine pressure relief measures.  7. Fluids/Electrolytes/Nutrition:  Monitor I/O. Check CMET in  am  8. HTN: Monitor BP TID--continue Norvasc and Cozaar (question compliant PTA)             --monitor for orthosatic changes.  10. T2DM: Hgb A1C- 6.6 and new diagonsis. Will monitor BS ac/hs and use SSI for now --change diet to Carb Mod and consult RD for diet education 11. GERD: Resume PPI 12. Intermittent hypokalemia: Recheck electrolytes in AM. 13.. Bereavement: Lost a friend recently.        Jacquelynn Cree, PA-C 05/31/2023  I have personally performed a face to face diagnostic evaluation of this patient and formulated the key components of the plan.  Additionally, I have personally reviewed laboratory data, imaging studies, as well as relevant notes and concur with the physician assistant's documentation above.  The patient's status has not changed from the original H&P.  Any changes in documentation from the acute care chart have been noted above.  Ranelle Oyster, MD, [place] [facility]

## 2023-05-31 NOTE — Progress Notes (Signed)
Nurse with [facility] called and I gave report to her. All discharge paperwork in discharge packet. Patient will be transported via [facility].

## 2023-05-31 NOTE — Progress Notes (Signed)
PMR Admission Coordinator Pre-Admission Assessment   Patient: Rachael Aguilar is an 57 y.o., female MRN: [id number] DOB: 06/03/1966 Height: 5\' 4"  (162.6 cm) Weight: 73.6 kg   Insurance Information HMO:     PPO:      PCP:      IPA:      80/20:      OTHER:  PRIMARY: UNINSURED      Policy#:       Subscriber:  CM Name:       Phone#:      Fax#:  Pre-Cert#:       Employer:  Benefits:  Phone #:      Name:  Eff. Date:      Deduct:       Out of Pocket Max:       Life Max:  CIR:       SNF:  Outpatient:      Co-Pay:  Home Health:       Co-Pay:  DME:      Co-Pay:  Providers:  SECONDARY:       Policy#:      Phone#:    ArtistAlfredo Martinez      Phone#: [phone]   The "Data Collection Information Summary" for patients in Inpatient Rehabilitation Facilities with attached "Privacy Act Statement-Health Care Records" was provided and verbally reviewed with: N/A   Emergency Contact Information Contact Information       Name Relation Home Work [place] Daughter [phone]   [phone]         Other Contacts   None on File        Current Medical History  Patient Admitting Diagnosis: CVA History of Present Illness: Pt is a 57 year old female with medical hx significant for: HTN, bipolar disorder, GERD. Pt presented to [facility] on 05/28/23 d/t right-sided weakness and slurred speech x2 days and also falls. 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 Neurology consulted. MRI showed the left caudate nucleus infarct seen on CT was chronic. Acute infarct in left pons noted. CTA head/neck was negative for LVO. Therapy evaluations completed and CIR recommended d/t pt's functional mobility deficits and cognitive-linguistic deficits. Complete NIHSS TOTAL: 3   Patient's medical record from [facility] has been reviewed by the rehabilitation admission coordinator and  physician.   Past Medical History      Past Medical History:  Diagnosis Date   Bipolar depression (HCC)     Hypertension     Lower GI bleed            Has the patient had major surgery during 100 days prior to admission? No   Family History   family history includes Hypertension in her mother; Lupus in her maternal grandmother; Ovarian cancer in her mother; Rheum arthritis in her maternal aunt; Throat cancer in her mother.   Current Medications  Current Medications    Current Facility-Administered Medications:    acetaminophen (TYLENOL) tablet 650 mg, 650 mg, Oral, Q6H PRN, 650 mg at 05/29/23 1228 **OR** acetaminophen (TYLENOL) suppository 650 mg, 650 mg, Rectal, Q6H PRN, Cox, Amy N, DO   amLODipine (NORVASC) tablet 10 mg, 10 mg, Oral, Daily, Amin, Sumayya, MD, 10 mg at 05/30/23 0859   aspirin EC tablet 81 mg, 81 mg, Oral, Daily, Caryl Pina, MD, 81 mg at 05/30/23 0858   atorvastatin (LIPITOR) tablet 40 mg, 40 mg, Oral, Daily, Caryl Pina, MD, 40  mg at 05/30/23 0859   clopidogrel (PLAVIX) tablet 75 mg, 75 mg, Oral, Daily, Caryl Pina, MD, 75 mg at 05/30/23 0858   cyclobenzaprine (FLEXERIL) tablet 10 mg, 10 mg, Oral, TID PRN, Cox, Amy N, DO, 10 mg at 05/29/23 0018   guaiFENesin (ROBITUSSIN) 100 MG/5ML liquid 5 mL, 5 mL, Oral, Q4H PRN, Arnetha Courser, MD, 5 mL at 05/29/23 1228   hydrALAZINE (APRESOLINE) injection 5 mg, 5 mg, Intravenous, Q6H PRN, Cox, Amy N, DO, 5 mg at 05/28/23 1710   losartan (COZAAR) tablet 25 mg, 25 mg, Oral, Daily, Amin, Tilman Neat, MD   ondansetron (ZOFRAN) tablet 4 mg, 4 mg, Oral, Q6H PRN **OR** ondansetron (ZOFRAN) injection 4 mg, 4 mg, Intravenous, Q6H PRN, Cox, Amy N, DO   senna-docusate (Senokot-S) tablet 1 tablet, 1 tablet, Oral, QHS PRN, Cox, Amy N, DO   sodium chloride flush (NS) 0.9 % injection 3 mL, 3 mL, Intravenous, Once, Cox, Amy N, DO     Patients Current Diet:  Diet Order                  Diet Heart Room service appropriate? Yes; Fluid  consistency: Thin  Diet effective now                         Precautions / Restrictions Precautions Precautions: Fall Restrictions Weight Bearing Restrictions: No    Has the patient had 2 or more falls or a fall with injury in the past year? Yes   Prior Activity Level Limited Community (1-2x/wk): drives, gets out of house ~2 days/week   Prior Functional Level Self Care: Did the patient need help bathing, dressing, using the toilet or eating? Independent   Indoor Mobility: Did the patient need assistance with walking from room to room (with or without device)? Independent   Stairs: Did the patient need assistance with internal or external stairs (with or without device)? Independent   Functional Cognition: Did the patient need help planning regular tasks such as shopping or remembering to take medications? Independent   Patient Information Are you of Hispanic, Latino/a,or Spanish origin?: A. No, not of Hispanic, Latino/a, or Spanish origin What is your race?: B. Black or African American Do you need or want an interpreter to communicate with a doctor or health care staff?: 0. No   Patient's Response To:  Health Literacy and Transportation Is the patient able to respond to health literacy and transportation needs?: Yes Health Literacy - How often do you need to have someone help you when you read instructions, pamphlets, or other written material from your doctor or pharmacy?: Never In the past 12 months, has lack of transportation kept you from medical appointments or from getting medications?: No In the past 12 months, has lack of transportation kept you from meetings, work, or from getting things needed for daily living?: No   Home Assistive Devices / Equipment Home Equipment: None   Prior Device Use: Indicate devices/aids used by the patient prior to current illness, exacerbation or injury? None of the above   Current Functional Level Cognition   Arousal/Alertness:  Awake/alert Overall Cognitive Status: Impaired/Different from baseline Orientation Level: Oriented X4 General Comments: AO x4 Attention: Sustained, Selective Selective Attention: Impaired Memory: Impaired Awareness: Impaired Awareness Impairment: Intellectual impairment Problem Solving: Impaired Problem Solving Impairment: Verbal complex Executive Function: Reasoning, Self Monitoring, Self Correcting Reasoning: Impaired Self Monitoring: Impaired Self Correcting: Impaired Safety/Judgment: Impaired    Extremity Assessment (includes Sensation/Coordination)   Upper  Extremity Assessment: Right hand dominant, RUE deficits/detail RUE Deficits / Details: R shoulder shoulder/abd 2+, elbow flex 3+, elbow ext 3, sup/pron 3, wrist flex/ext 3 RUE: Shoulder pain with ROM RUE Sensation: WNL RUE Coordination: decreased fine motor (mild-moderate RUE apraxia; able to make hand signals of numbers 1-5 in R hand with extra time and vc)  Lower Extremity Assessment: Defer to PT evaluation RLE Deficits / Details: decreased strength grossly on RLE; 3+/5 RLE Sensation: decreased light touch RLE Coordination: decreased gross motor LLE Deficits / Details: WNL LLE Sensation: WNL LLE Coordination: WNL     ADLs   Overall ADL's : Needs assistance/impaired Eating/Feeding: Minimal assistance Eating/Feeding Details (indicate cue type and reason): CGA to maintain grasp of water bottle in R hand and bring to mouth x3 sips.  Pt able to stabilize bottle on R thigh with R hand while opening top with L hand.  Encouraged pt begin attempts at eating finger foods with R hand for lunch and supper this date. Toilet Transfer: Minimal assistance, Moderate assistance, +2 for physical assistance Toilet Transfer Details (indicate cue type and reason): simulated amb to BR > sit in recliner Toileting - Clothing Manipulation Details (indicate cue type and reason): Pt reports acute incontence Functional mobility during ADLs:  Moderate assistance     Mobility   Overal bed mobility: Needs Assistance Bed Mobility: Supine to Sit Supine to sit: Supervision General bed mobility comments: NT this date as pt was received sitting EOB and left with SLP sitting EOB.     Transfers   Overall transfer level: Needs assistance Equipment used: Rolling walker (2 wheels) Transfers: Sit to/from Stand Sit to Stand: Contact guard assist Bed to/from chair/wheelchair/BSC transfer type:: Lateral/scoot transfer Step pivot transfers: Contact guard assist General transfer comment: Pt performed sit to stand with min guard at EOB in order to laterally step L toward foot of bed and return to sitting to allow better access for OT to address RUE EOB.     Ambulation / Gait / Stairs / Wheelchair Mobility   Ambulation/Gait Ambulation/Gait assistance: Clinical research associate (Feet): 100 Feet Assistive device: Rolling walker (2 wheels) Gait Pattern/deviations: Decreased step length - left, Decreased stance time - right, Decreased dorsiflexion - right, Knees buckling, Steppage, Decreased weight shift to right General Gait Details: Amb ~100' with RW and CGA 2/2 R knee instability and balance impairments; noted increase in RLE gait deviations by the end of ambulation; pt takes wide turns with RW and requires VCs to correct. Gait velocity: Decreased     Posture / Balance Dynamic Sitting Balance Sitting balance - Comments: No LOB seated in recliner Balance Overall balance assessment: Needs assistance Sitting-balance support: No upper extremity supported, Feet supported Sitting balance-Leahy Scale: Good Sitting balance - Comments: No LOB seated in recliner Standing balance support: Single extremity supported Standing balance-Leahy Scale: Fair Standing balance comment: min guard for L lateral stepping toward foot of bed, back of legs lightly pressed against EOB for stability High level balance activites: Side stepping High Level  Balance Comments: min guard at bedside     Special needs/care consideration NA    Previous Home Environment (from acute therapy documentation) Living Arrangements: Children (friend's children) Available Help at Discharge: Family, Available PRN/intermittently Type of Home: Mobile home Home Layout: One level Home Access: Stairs to enter Entrance Stairs-Rails: None Entrance Stairs-Number of Steps: 4 Bathroom Shower/Tub: Engineer, manufacturing systems: Handicapped height Bathroom Accessibility: Yes How Accessible: Accessible via walker Home Care Services: No   Discharge  Living Setting Plans for Discharge Living Setting: Apartment (daughter, Chasity's house) Type of Home at Discharge: Apartment Discharge Home Layout: One level Discharge Home Access: Stairs to enter Entrance Stairs-Rails: Right Entrance Stairs-Number of Steps: 10 Discharge Bathroom Shower/Tub: Tub/shower unit Discharge Bathroom Toilet: Standard Discharge Bathroom Accessibility: Yes How Accessible: Accessible via walker Does the patient have any problems obtaining your medications?: No   Social/Family/Support Systems Anticipated Caregiver: Jodee Bishara (daughter) and Babette Relic (aunt) Anticipated Caregiver's Contact Information: Chasity: [phone] Caregiver Availability: 24/7 Discharge Plan Discussed with Primary Caregiver: Yes Is Caregiver In Agreement with Plan?: Yes Does Caregiver/Family have Issues with Lodging/Transportation while Pt is in Rehab?: No   Goals Patient/Family Goal for Rehab: Supervision: PT/OT/ST Expected length of stay: 7-10 days Pt/Family Agrees to Admission and willing to participate: Yes Program Orientation Provided & Reviewed with Pt/Caregiver Including Roles  & Responsibilities: Yes   Decrease burden of Care through IP rehab admission: NA   Possible need for SNF placement upon discharge: Not anticipated   Patient Condition: I have reviewed medical records from [facility], spoken with CM, and patient and daughter. I discussed via phone for inpatient rehabilitation assessment.  Patient will benefit from ongoing PT, OT, and SLP, can actively participate in 3 hours of therapy a day 5 days of the week, and can make measurable gains during the admission.  Patient will also benefit from the coordinated team approach during an Inpatient Acute Rehabilitation admission.  The patient will receive intensive therapy as well as Rehabilitation physician, nursing, social worker, and care management interventions.  Due to safety, disease management, medication administration, pain management, and patient education the patient requires 24 hour a day rehabilitation nursing.  The patient is currently CGA with mobility and basic ADLs.  Discharge setting and therapy post discharge at home with home health is anticipated.  Patient has agreed to participate in the Acute Inpatient Rehabilitation Program and will admit today.   Preadmission Screen Completed By:  Domingo Pulse, 05/30/2023 4:15 PM ______________________________________________________________________   Discussed status with Dr. Riley Kill  on 05/31/23 at 1016 and received approval for admission today.   Admission Coordinator:  Domingo Pulse, CCC-SLP, with updates by Megan Salon, MS, CCC-SLP  time 1016/Date 05/31/23    Assessment/Plan: Diagnosis: left subcortical, pontine infarct Does the need for close, 24 hr/day Medical supervision in concert with the patient's rehab needs make it unreasonable for this patient to be served in a less intensive setting? Yes Co-Morbidities requiring supervision/potential complications: htn, bipolar disorder Due to bladder management, bowel management, safety, skin/wound care, disease management, medication administration, pain management, and patient education, does the patient require 24 hr/day rehab nursing? Yes Does the patient require coordinated care of a  physician, rehab nurse, PT, OT, and SLP to address physical and functional deficits in the context of the above medical diagnosis(es)? Yes Addressing deficits in the following areas: balance, endurance, locomotion, strength, transferring, bowel/bladder control, bathing, dressing, feeding, grooming, toileting, cognition, speech, swallowing, and psychosocial support Can the patient actively participate in an intensive therapy program of at least 3 hrs of therapy 5 days a week? Yes The potential for patient to make measurable gains while on inpatient rehab is excellent Anticipated functional outcomes upon discharge from inpatient rehab: supervision PT, supervision OT, supervision SLP Estimated rehab length of stay to reach the above functional goals is: 7-10 days Anticipated discharge destination: Home 10. Overall Rehab/Functional Prognosis: excellent     MD Signature: Ranelle Oyster, MD, [facility]  Physical Medicine & Rehabilitation Medical Director Rehabilitation Services 05/31/2023

## 2023-05-31 NOTE — TOC Transition Note (Signed)
Transition of Care [facility]) - CM/SW Discharge Note   Patient Details  Name: Rachael Aguilar MRN: [id number] Date of Birth: 1966/02/13  [facility] CM/SW Contact:  Garret Reddish, RN Phone Number: 05/31/2023, 11:08 AM   Clinical Narrative:    Chart reviewed.  Informed by CIR that they will have a bed for patient today. Dr. Nelson Chimes has been made aware.    CIR will arranged transport to CIR today.   Staff nurse made aware.     Final next level of care: IP Rehab Facility (CIR) Barriers to Discharge: Inadequate or no insurance   Patient Goals and CMS Choice CMS Medicare.gov Compare Post Acute Care list provided to:: Patient Choice offered to / list presented to : Patient  Discharge Placement                    Name of family member notified: Patient's daughter Patient and family notified of of transfer: 05/31/23  Discharge Plan and Services Additional resources added to the After Visit Summary for                                       Social Determinants of Health (SDOH) Interventions SDOH Screenings   Food Insecurity: No Food Insecurity (05/28/2023)  Housing: Low Risk  (05/28/2023)  Transportation Needs: No Transportation Needs (05/28/2023)  Utilities: Not At Risk (05/28/2023)  Alcohol Screen: Low Risk  (04/21/2021)  Depression (PHQ2-9): Low Risk  (04/21/2021)  Tobacco Use: Low Risk  (05/29/2023)     Readmission Risk Interventions     No data to display

## 2023-05-31 NOTE — Discharge Summary (Signed)
Physician Discharge Summary   Patient: Rachael Aguilar MRN: [id number] DOB: 01-17-1966  Admit date:     05/28/2023  Discharge date: 05/31/23  Discharge Physician: Arnetha Courser   PCP: Bosie Clos, MD   Recommendations at discharge:  Please obtain CBC and BMP on follow-up Follow-up with primary care provider after releasing from CIR Follow-up with neurology for stroke  Patient is currently being discharged to CIR  Discharge Diagnoses: Principal Problem:   Stroke (cerebrum) [facility]) Active Problems:   Essential hypertension   Hypokalemia   Grief   Anxiety and depression   Gastro-esophageal reflux disease without esophagitis   Diverticulosis   Hospital Course: [name] is a 57 year old female with history of hypertension, GERD, who presents emergency department for chief concerns of right-sided weakness and slurred speech for 2 days. Patient had multiple falls and near falls because of that over the past 2 days.  Vitals in the ED showed temperature of 98.4, respiration rate 20, heart rate of 102, blood pressure 192/108, SpO2 of 99% on room air.  Serum sodium is 140, potassium 3.0, chloride 102, bicarb 26, BUN of 10, serum creatinine 0.88, nonfasting blood glucose 101, EGFR greater than 60, WBC 6.1, hemoglobin 14.8, platelets of 296.  CT head wo contrast: Was read as subacute lacunar infarct involving the left caudate nucleus and anterior limb of the internal capsule.  Moderate chronic small vessel disease and old right basal ganglia lacunar infarct.  She received full dose of aspirin in ED.  Neurology was consulted. Patient did not receive tPA due to being out of window.  10/20: Blood pressure mildly elevated at 158/102, lipid panel with LDL of 118 with goal of less than 70, potassium 3, magnesium 1.9, CK mildly elevated at 285 with history of fall.  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 CTA of head and neck no LVO.  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  PT is recommending CIR.  10/21:BP mildly elevated but improving.  Starting on losartan.   10/21: Vital stable.  Blood pressure improved on losartan, having some cough for which she can get as needed Mucinex.  No fever or congestion.  No new deficit and prior deficits seems improving. Patient had a bed at CIR where she is being discharged for further rehab.  Patient will continue on DAPT with aspirin and Plavix for 3 months followed by aspirin only.  She will continue on current medications and follow-up with her providers after being discharged from CIR.  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 echocardiogram normal -Neurology is recommending DAPT for 3 months with aspirin and Plavix. -Continue with statin -Continue supportive care  Essential hypertension Blood pressure remained elevated. -Continue increased dose of amlodipine -Adding losartan at 25 mg daily -Patient was not taking triamterene-hydrochlorothiazide at home so it was discontinued. -As needed hydralazine  Hypokalemia Serum magnesium level is 1.9, potassium remained at 3.0 -Replace potassium and monitor  Grief Patient states she family friend recently passed away Patient tearful at bedside Counseled patient that she needs to take herself and  not neglect her health   Consultants: Neurology Procedures performed: None Disposition:  CIR at [facility] Diet recommendation:  Discharge Diet Orders (From admission, onward)     Start     Ordered   05/31/23 0000  Diet - low sodium heart  healthy        05/31/23 1014           Cardiac diet DISCHARGE MEDICATION: Allergies as of 05/31/2023   No Known Allergies      Medication List     STOP taking these medications    lisinopril-hydrochlorothiazide 10-12.5 MG tablet Commonly known as: Zestoretic   meloxicam 15 MG tablet Commonly known as: Mobic   pantoprazole 40 MG tablet Commonly known as: PROTONIX   triamterene-hydrochlorothiazide 37.5-25 MG capsule Commonly known as: DYAZIDE       TAKE these medications    amLODipine 10 MG tablet Commonly known as: NORVASC Take 1 tablet (10 mg total) by mouth daily. Start taking on: June 01, 2023 What changed:  medication strength how much to take   aspirin EC 81 MG tablet Take 1 tablet (81 mg total) by mouth daily. Swallow whole. Start taking on: June 01, 2023   atorvastatin 40 MG tablet Commonly known as: LIPITOR Take 1 tablet (40 mg total) by mouth daily. Start taking on: June 01, 2023   clopidogrel 75 MG tablet Commonly known as: PLAVIX Take 1 tablet (75 mg total) by mouth daily. Start taking on: June 01, 2023   cyclobenzaprine 10 MG tablet Commonly known as: FLEXERIL Take 1 tablet (10 mg total) by mouth 3 (three) times daily as needed for muscle spasms.   dextromethorphan-guaiFENesin 30-600 MG 12hr tablet Commonly known as: MUCINEX DM Take 1 tablet by mouth 2 (two) times daily.   losartan 25 MG tablet Commonly known as: COZAAR Take 1 tablet (25 mg total) by mouth daily. Start taking on: June 01, 2023   mometasone 0.1 % cream Commonly known as: ELOCON Apply topically daily. Apply Daily for rash        Follow-up Information     Bosie Clos, MD. Go on 06/07/2023.   Specialty: Family Medicine Why: Patient to make own follow up appt Contact information: [address] [place] [address] [place] [address] [phone]                Discharge Exam: Ceasar Mons Weights   05/28/23 1408 05/28/23 2112  Weight:  77.1 kg 73.6 kg   General.  Well-developed lady, in no acute distress. Pulmonary.  Lungs clear bilaterally, normal respiratory effort. CV.  Regular rate and rhythm, no JVD, rub or murmur. Abdomen.  Soft, nontender, nondistended, BS positive. CNS.  Alert and oriented .  No focal neurologic deficit. Extremities.  No edema, no cyanosis, pulses intact and symmetrical. Psychiatry.  Judgment and insight appears normal.   Condition at discharge: stable  The results of significant diagnostics from this hospitalization (including imaging, microbiology, ancillary and laboratory) are listed below for reference.   Imaging Studies: ECHOCARDIOGRAM COMPLETE BUBBLE STUDY  Result Date: 05/29/2023    ECHOCARDIOGRAM REPORT   Patient Name:   KALINE POURCIAU Date of Exam: 05/29/2023 Medical Rec #:  [id number]    Height:       64.0 in Accession #:    [id number]   Weight:       162.3 lb Date of Birth:  06/30/1966    BSA:          1.790 m Patient Age:    22  years     BP:           177/106 mmHg Patient Gender: F            HR:           105 bpm. Exam Location:  [facility] Procedure: 2D Echo, Cardiac Doppler, Color Doppler and Strain Analysis Indications:     Stroke 434.91 / I63.9  History:         Patient has no prior history of Echocardiogram examinations.                  Risk Factors:Hypertension.  Sonographer:     Neysa Bonito Roar Referring Phys:  [phone] ERIC LINDZEN Diagnosing Phys: Debbe Odea MD  Sonographer Comments: Global longitudinal strain was attempted. IMPRESSIONS  1. Left ventricular ejection fraction, by estimation, is 55 to 60%. The left ventricle has normal function. The left ventricle has no regional wall motion abnormalities. There is moderate concentric left ventricular hypertrophy. Left ventricular diastolic parameters are consistent with Grade I diastolic dysfunction (impaired relaxation). The average left ventricular global longitudinal strain is -17.4 %. The global longitudinal strain is normal.  2. Right  ventricular systolic function is normal. The right ventricular size is normal.  3. Left atrial size was mildly dilated.  4. The mitral valve is normal in structure. No evidence of mitral valve regurgitation.  5. The aortic valve is tricuspid. Aortic valve regurgitation is not visualized.  6. The inferior vena cava is normal in size with greater than 50% respiratory variability, suggesting right atrial pressure of 3 mmHg.  7. Agitated saline contrast bubble study was negative, with no evidence of any interatrial shunt. FINDINGS  Left Ventricle: Left ventricular ejection fraction, by estimation, is 55 to 60%. The left ventricle has normal function. The left ventricle has no regional wall motion abnormalities. The average left ventricular global longitudinal strain is -17.4 %. The global longitudinal strain is normal. The left ventricular internal cavity size was normal in size. There is moderate concentric left ventricular hypertrophy. Left ventricular diastolic parameters are consistent with Grade I diastolic dysfunction (impaired relaxation). Right Ventricle: The right ventricular size is normal. No increase in right ventricular wall thickness. Right ventricular systolic function is normal. Left Atrium: Left atrial size was mildly dilated. Right Atrium: Right atrial size was normal in size. Pericardium: There is no evidence of pericardial effusion. Mitral Valve: The mitral valve is normal in structure. No evidence of mitral valve regurgitation. MV peak gradient, 7.5 mmHg. The mean mitral valve gradient is 4.0 mmHg. Tricuspid Valve: The tricuspid valve is normal in structure. Tricuspid valve regurgitation is not demonstrated. Aortic Valve: The aortic valve is tricuspid. Aortic valve regurgitation is not visualized. Aortic valve mean gradient measures 6.0 mmHg. Aortic valve peak gradient measures 10.1 mmHg. Aortic valve area, by VTI measures 1.71 cm. Pulmonic Valve: The pulmonic valve was grossly normal. Pulmonic  valve regurgitation is not visualized. Aorta: The aortic root is normal in size and structure. Venous: The inferior vena cava is normal in size with greater than 50% respiratory variability, suggesting right atrial pressure of 3 mmHg. IAS/Shunts: No atrial level shunt detected by color flow Doppler. Agitated saline contrast was given intravenously to evaluate for intracardiac shunting. Agitated saline contrast bubble study was negative, with no evidence of any interatrial shunt.  LEFT VENTRICLE PLAX 2D LVIDd:         3.80 cm   Diastology LVIDs:         2.60 cm   LV  e' medial:    6.42 cm/s LV PW:         1.20 cm   LV E/e' medial:  11.2 LV IVS:        1.40 cm   LV e' lateral:   8.70 cm/s LVOT diam:     1.80 cm   LV E/e' lateral: 8.3 LV SV:         41 LV SV Index:   23        2D Longitudinal Strain LVOT Area:     2.54 cm  2D Strain GLS Avg:     -17.4 %  RIGHT VENTRICLE RV Basal diam:  2.60 cm RV Mid diam:    2.40 cm RV S prime:     17.70 cm/s TAPSE (M-mode): 1.9 cm LEFT ATRIUM             Index        RIGHT ATRIUM           Index LA diam:        3.50 cm 1.96 cm/m   RA Area:     11.00 cm LA Vol (A2C):   65.8 ml 36.76 ml/m  RA Volume:   20.90 ml  11.68 ml/m LA Vol (A4C):   56.7 ml 31.68 ml/m LA Biplane Vol: 61.6 ml 34.41 ml/m  AORTIC VALVE                     PULMONIC VALVE AV Area (Vmax):    1.98 cm      PV Vmax:          0.96 m/s AV Area (Vmean):   1.67 cm      PV Peak grad:     3.7 mmHg AV Area (VTI):     1.71 cm      PR End Diast Vel: 6.76 msec AV Vmax:           159.00 cm/s   RVOT Peak grad:   3 mmHg AV Vmean:          117.000 cm/s AV VTI:            0.238 m AV Peak Grad:      10.1 mmHg AV Mean Grad:      6.0 mmHg LVOT Vmax:         124.00 cm/s LVOT Vmean:        77.000 cm/s LVOT VTI:          0.160 m LVOT/AV VTI ratio: 0.67  AORTA Ao Root diam: 2.50 cm Ao Asc diam:  3.20 cm MITRAL VALVE MV Area (PHT): 8.16 cm     SHUNTS MV Area VTI:   2.15 cm     Systemic VTI:  0.16 m MV Peak grad:  7.5 mmHg      Systemic Diam: 1.80 cm MV Mean grad:  4.0 mmHg MV Vmax:       1.37 m/s MV Vmean:      91.6 cm/s MV Decel Time: 93 msec MV E velocity: 71.80 cm/s MV A velocity: 127.00 cm/s MV E/A ratio:  0.57 MV A Prime:    11.9 cm/s Debbe Odea MD Electronically signed by Debbe Odea MD Signature Date/Time: 05/29/2023/3:08:34 PM    Final    MR BRAIN WO CONTRAST  Result Date: 05/28/2023 CLINICAL DATA:  Right-sided weakness and slurred speech, balance issues, multiple falls EXAM: MRI HEAD WITHOUT CONTRAST TECHNIQUE: Multiplanar, multiecho pulse sequences of the brain and surrounding structures were obtained without  intravenous contrast. COMPARISON:  No prior MRI available, correlation is made with CT head 05/28/2023 FINDINGS: Brain: Restricted diffusion with ADC correlate in the left pons, consistent with acute infarct, measuring up to 11 mm (series 9, image 15). This is adjacent to a chronic left pontine infarct (series 11, image 8). Additional remote lacunar infarcts in the bilateral basal ganglia, left cerebellum, and bilateral corona radiata. Scattered and confluent T2 hyperintense signal in the periventricular white matter, likely the sequela of moderate chronic small vessel ischemic disease. No acute hemorrhage, mass, mass effect, or midline shift. No hydrocephalus or extra-axial collection. Normal craniocervical junction. 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 Vascular: Normal arterial flow voids. Skull and upper cervical spine: Normal marrow signal. Sinuses/Orbits: Mucosal thickening in the sphenoid sinuses and ethmoid air cells. No acute finding in the orbits. Other: Trace fluid in the mastoid air cells. IMPRESSION: 1. Acute infarct in the left pons. 2. 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 These results will be called to the ordering clinician or representative by the Radiologist Assistant, and communication documented in the PACS or [employer]. Electronically Signed   By: Wiliam Ke M.D.   On: 05/28/2023 23:23   CT ANGIO HEAD NECK W WO CM  Result Date: 05/28/2023 CLINICAL DATA:  Right-sided weakness and slurred speech, multiple falls and balance issues EXAM: CT ANGIOGRAPHY HEAD AND NECK WITH AND WITHOUT CONTRAST TECHNIQUE: Multidetector CT imaging of the head and neck was performed using the standard protocol during bolus administration of intravenous contrast. Multiplanar CT image reconstructions and MIPs were obtained to evaluate the vascular anatomy. Carotid stenosis measurements (when applicable) are obtained utilizing NASCET criteria, using the distal internal carotid diameter as the denominator. RADIATION DOSE REDUCTION: This exam was performed according to the departmental dose-optimization program which includes automated exposure control, adjustment of the mA and/or kV according to patient size and/or use of iterative reconstruction technique. CONTRAST:  75mL OMNIPAQUE IOHEXOL 350 MG/ML SOLN COMPARISON:  No prior CTA available, correlation is made with CT head 05/28/2023 FINDINGS: CT HEAD FINDINGS For noncontrast findings, please see same day CT head. CTA NECK FINDINGS Aortic arch: Standard branching. Imaged portion shows no evidence of aneurysm or dissection. No significant stenosis of the major arch vessel origins. Right carotid system: No evidence of dissection, occlusion, or hemodynamically significant stenosis (greater than 50%). Left carotid system: No evidence of dissection, occlusion, or hemodynamically significant stenosis (greater than 50%). Vertebral arteries: No evidence of dissection, occlusion, or hemodynamically significant stenosis (greater than 50%). Skeleton: No acute osseous abnormality. Degenerative changes in the cervical spine. Other neck: No  acute finding. Upper chest: No focal pulmonary opacity or pleural effusion. Review of the MIP images confirms the above findings CTA HEAD FINDINGS Anterior circulation: Both internal carotid arteries are patent to the termini, with calcifications but without significant stenosis. A1 segments patent. 2 mm inferiorly directed outpouching from the right aspect of the anterior communicating artery (series 6, image 125 and series 7, image 78). Anterior cerebral arteries are patent to their distal aspects without significant stenosis. No M1 stenosis or occlusion. MCA branches perfused to their distal aspects without significant stenosis. Posterior circulation: Vertebral arteries patent to the vertebrobasilar junction without significant stenosis. Posterior inferior cerebellar arteries patent proximally. Basilar  patent to its distal aspect without significant stenosis. Superior cerebellar arteries patent proximally. Patent P1 segments. Focal severe stenosis in the mid right P2 (series 6, image 116). PCAs perfused to their distal aspects without significant stenosis. The bilateral posterior communicating arteries are not visualized. Venous sinuses: As permitted by contrast timing, patent. Anatomic variants: None significant. No evidence of aneurysm or vascular malformation. Review of the MIP images confirms the above findings IMPRESSION: 1. No intracranial large vessel occlusion. 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 2. No hemodynamically significant stenosis in the neck. 3. 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 Electronically Signed   By: Wiliam Ke M.D.   On: 05/28/2023 23:18   CT HEAD WO CONTRAST  Result Date: 05/28/2023 CLINICAL DATA:  Acute right-sided weakness and slurred speech. Several recent falls. EXAM: CT HEAD WITHOUT CONTRAST TECHNIQUE: Contiguous axial images were obtained from the base of the skull through the  vertex without intravenous contrast. RADIATION DOSE REDUCTION: This exam was performed according to the departmental dose-optimization program which includes automated exposure control, adjustment of the mA and/or kV according to patient size and/or use of iterative reconstruction technique. COMPARISON:  11/09/2020 FINDINGS: Brain: No evidence of intracranial hemorrhage, hydrocephalus, extra-axial collection, or mass lesion/mass effect. Moderate chronic small vessel disease is again seen. Old lacunar infarct is seen involving the right lentiform nucleus. A new poorly defined area of decreased attenuation is seen involving the left caudate nucleus and anterior limb of the internal capsule, consistent with a subacute lacunar infarct. Vascular:  No hyperdense vessel or other acute findings. Skull: No evidence of fracture or other significant bone abnormality. Sinuses/Orbits:  No acute findings. Other: None. 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 Moderate chronic small vessel disease and old right basal ganglia lacunar infarct. Electronically Signed   By: Danae Orleans M.D.   On: 05/28/2023 15:30    Microbiology: Results for orders placed or performed during the hospital encounter of 11/26/20  Urine culture     Status: Abnormal   Collection Time: 11/26/20  9:23 PM   Specimen: Urine, Random  Result Value Ref Range Status   Specimen Description   Final    URINE, RANDOM Performed at [facility], [address] [place], [place] [address]    Special Requests   Final    NONE Performed at [facility], [address] [place], [place] [address]    Culture 40,000 COLONIES/mL LACTOBACILLUS SPECIES (A)  Final   Report Status 11/28/2020 FINAL  Final    Labs: CBC: Recent Labs  Lab 05/28/23 1418 05/29/23 0507  WBC 6.1 5.4  NEUTROABS 3.8  --   HGB 14.8 14.5  HCT 43.6 41.3  MCV 90.8 89.4  PLT 296 277    Basic Metabolic Panel: Recent Labs  Lab 05/28/23 1418 05/29/23 0507  NA 140 139  K 3.0* 3.0*  CL 102 103  CO2 26 26  GLUCOSE 101* 145*  BUN 10 9  CREATININE 0.88 0.67  CALCIUM 9.2 8.8*  MG 1.9 1.9   Liver Function Tests: Recent Labs  Lab 05/28/23 1418  AST 24  ALT 24  ALKPHOS 87  BILITOT 1.2  PROT 7.8  ALBUMIN 4.3   CBG: No results for input(s): "GLUCAP" in the last 168 hours.  Discharge time spent: greater than 30 minutes.  This record has been created using [employer]  software. Errors have been sought and corrected,but may not always be located. Such creation errors do not reflect on the standard of care.   Signed: Arnetha Courser, MD [facility] 05/31/2023

## 2023-05-31 NOTE — Progress Notes (Signed)
Speech Language Pathology Treatment: Cognitive-Linquistic  Patient Details Name: Rachael Aguilar MRN: [id number] DOB: 1966/06/23 Today's Date: 05/31/2023 Time: 0850-0910 SLP Time Calculation (min) (ACUTE ONLY): 20 min  Assessment / Plan / Recommendation Clinical Impression  Pt seen for skilled SLP services targeting motor-speech deficits. Pt resting in bed upon SLP entrance to room. Agreeable to tx. Pt educated in the importance of positioning/breath support to improve vocal loudness. Pt adjusted bed to improve positioning with extra time after education provided.Reviewed compensatory strategies for dysarthria including slowed speech rate and increased vocal loudness. Pt able to utilize with min verbal cueing during informal conversational exchanges.  Pt continues to present with s/sx mild-moderate dysarthria c/b reduced vocal loudness, inconsitent fast rate of speech, and articulatory imprecision. Pt approx 90% intelligible this date. Cognitive-linguistic deficits not addressed directly this date; however, pt with good attention to tx tasks. Pt eager to d/c (home vs CIR). Pt educated re: benefits of CIR/intensive rehabilitation following stroke. Pt verbalized understanding/agreement with all education provided this date. SLP to continue to f/u per POC.   HPI HPI: Pt is a 57 year old female presented to ED on 10/19 with chief concerns of R-sided weakness and slurred speech for 2 days. Pt with history of hypertension and GERD.  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     Moderate chronic small vessel disease and old right basal ganglia  lacunar infarct." MRI brain, 10/19: "1. Acute infarct in the left pons.  2. 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  Recommendations may be updated based on patient status, additional functional criteria and insurance authorization.    Recommendations                         Frequent or constant Supervision/Assistance Dysarthria and anarthria (R47.1);Cognitive communication deficit (R41.841)     Continue with current plan of care    Rachael Aguilar, M.S., CCC-SLP Speech-Language Pathologist [facility] [phone] Rachael Aguilar)  Rachael Aguilar  05/31/2023, 9:27 AM

## 2023-05-31 NOTE — Progress Notes (Signed)
Inpatient Rehabilitation Center Individual Statement of Services  Patient Name:  CHONITA LINDBLOM  Date:  05/31/2023  Welcome to the Inpatient Rehabilitation Center.  Our goal is to provide you with an individualized program based on your diagnosis and situation, designed to meet your specific needs.  With this comprehensive rehabilitation program, you will be expected to participate in at least 3 hours of rehabilitation therapies Monday-Friday, with modified therapy programming on the weekends.  Your rehabilitation program will include the following services:  Physical Therapy (PT), Occupational Therapy (OT), Speech Therapy (ST), 24 hour per day rehabilitation nursing, Therapeutic Recreaction (TR), Neuropsychology, Care Coordinator, Rehabilitation Medicine, Nutrition Services, Pharmacy Services, and Other  Weekly team conferences will be held on Wednesdays to discuss your progress.  Your Inpatient Rehabilitation Care Coordinator will talk with you frequently to get your input and to update you on team discussions.  Team conferences with you and your family in attendance may also be held.  Expected length of stay: 7-10 Days  Overall anticipated outcome:  Supervision  Depending on your progress and recovery, your program may change. Your Inpatient Rehabilitation Care Coordinator will coordinate services and will keep you informed of any changes. Your Inpatient Rehabilitation Care Coordinator's name and contact numbers are listed  below.  The following services may also be recommended but are not provided by the Inpatient Rehabilitation Center:   Home Health Rehabiltiation Services Outpatient Rehabilitation Services    Arrangements will be made to provide these services after discharge if needed.  Arrangements include referral to agencies that provide these services.  Your insurance has been verified to be:  uninsured Your primary doctor is:  Lars Pinks, MD  Pertinent information will be  shared with your doctor and your insurance company.  Inpatient Rehabilitation Care Coordinator:  Lavera Guise, [place] [phone] or [phone]  Information discussed with and copy given to patient by: Andria Rhein, 05/31/2023, 3:21 PM

## 2023-06-01 DIAGNOSIS — E1165 Type 2 diabetes mellitus with hyperglycemia: Secondary | ICD-10-CM

## 2023-06-01 LAB — COMPREHENSIVE METABOLIC PANEL
ALT: 29 U/L (ref 0–44)
AST: 20 U/L (ref 15–41)
Albumin: 3.4 g/dL — ABNORMAL LOW (ref 3.5–5.0)
Alkaline Phosphatase: 80 U/L (ref 38–126)
Anion gap: 10 (ref 5–15)
BUN: 18 mg/dL (ref 6–20)
CO2: 23 mmol/L (ref 22–32)
Calcium: 9.4 mg/dL (ref 8.9–10.3)
Chloride: 107 mmol/L (ref 98–111)
Creatinine, Ser: 0.69 mg/dL (ref 0.44–1.00)
GFR, Estimated: >60 mL/min (ref >60)
Glucose, Bld: 127 mg/dL — ABNORMAL HIGH (ref 70–99)
Potassium: 3.5 mmol/L (ref 3.5–5.1)
Sodium: 140 mmol/L (ref 135–145)
Total Bilirubin: 0.9 mg/dL (ref 0.3–1.2)
Total Protein: 6.4 g/dL — ABNORMAL LOW (ref 6.5–8.1)

## 2023-06-01 LAB — CBC WITH DIFFERENTIAL/PLATELET
Abs Immature Granulocytes: 0.02 10*3/uL (ref 0.00–0.07)
Basophils Absolute: 0 10*3/uL (ref 0.0–0.1)
Basophils Relative: 0 %
Eosinophils Absolute: 0.1 10*3/uL (ref 0.0–0.5)
Eosinophils Relative: 1 %
HCT: 44.2 % (ref 36.0–46.0)
Hemoglobin: 14.9 g/dL (ref 12.0–15.0)
Immature Granulocytes: 0 %
Lymphocytes Relative: 44 %
Lymphs Abs: 2.7 10*3/uL (ref 0.7–4.0)
MCH: 31.1 pg (ref 26.0–34.0)
MCHC: 33.7 g/dL (ref 30.0–36.0)
MCV: 92.3 fL (ref 80.0–100.0)
Monocytes Absolute: 0.5 10*3/uL (ref 0.1–1.0)
Monocytes Relative: 8 %
Neutro Abs: 2.9 10*3/uL (ref 1.7–7.7)
Neutrophils Relative %: 47 %
Platelets: 286 10*3/uL (ref 150–400)
RBC: 4.79 MIL/uL (ref 3.87–5.11)
RDW: 14.2 % (ref 11.5–15.5)
WBC: 6.2 10*3/uL (ref 4.0–10.5)
nRBC: 0 % (ref 0.0–0.2)

## 2023-06-01 LAB — GLUCOSE, CAPILLARY
Glucose-Capillary: 139 mg/dL — ABNORMAL HIGH (ref 70–99)
Glucose-Capillary: 127 mg/dL — ABNORMAL HIGH (ref 70–99)
Glucose-Capillary: 143 mg/dL — ABNORMAL HIGH (ref 70–99)
Glucose-Capillary: 140 mg/dL — ABNORMAL HIGH (ref 70–99)

## 2023-06-01 NOTE — Progress Notes (Signed)
Patient ID: Rachael Aguilar, female   DOB: [birth year], 57 y.o.   MRN: [id number]  Team Conference Report to Patient/Family  Team Conference discussion was reviewed with the patient and caregiver, including goals, any changes in plan of care and target discharge date.  Patient and caregiver express understanding and are in agreement.  The patient has a target discharge date of  (evals pending).  SW met with patient, introduced self and explained role. Patient anticipates discharging home with assistance from daughter. Daughter currently working, SW contact information provided. Daughter plans to be present today, Sw will wait for follow up.  Based on evaluations patient potential to have d/c date set between this evening and tomorrow once all evals are complete. SW will follow up with patient. No additional questions or concerns.  Andria Rhein 06/01/2023, 1:29 PM

## 2023-06-01 NOTE — Progress Notes (Signed)
Inpatient Rehabilitation  Patient information reviewed and entered into eRehab system by Oyuki Hogan M. Eri Mcevers, M.A., CCC/SLP, PPS Coordinator.  Information including medical coding, functional ability and quality indicators will be reviewed and updated through discharge.    

## 2023-06-01 NOTE — Evaluation (Signed)
Speech Language Pathology Assessment and Plan  Patient Details  Name: Rachael Aguilar MRN: [id number] Date of Birth: 05-19-1966  SLP Diagnosis: Cognitive Impairments;Dysarthria  Rehab Potential: Excellent ELOS: 7 days    Today's Date: 06/01/2023 SLP Individual Time: 1103-1200 SLP Individual Time Calculation (min): 57 min   Hospital Problem: Principal Problem:   Left pontine stroke [facility])  Past Medical History:  Past Medical History:  Diagnosis Date   Bipolar depression (HCC)    Hypertension    Lower GI bleed    Past Surgical History:  Past Surgical History:  Procedure Laterality Date   CHOLECYSTECTOMY N/A 04/02/2016   Procedure: LAPAROSCOPIC CHOLECYSTECTOMY;  Surgeon: Leafy Ro, MD;  Location: [facility] ORS;  Service: General;  Laterality: N/A;   DILATION AND CURETTAGE OF UTERUS     ESOPHAGOGASTRODUODENOSCOPY N/A 11/09/2020   Procedure: ESOPHAGOGASTRODUODENOSCOPY (EGD);  Surgeon: Toledo, Boykin Nearing, MD;  Location: [facility] ENDOSCOPY;  Service: Gastroenterology;  Laterality: N/A;   FLEXIBLE SIGMOIDOSCOPY N/A 11/09/2020   Procedure: FLEXIBLE SIGMOIDOSCOPY;  Surgeon: Toledo, Boykin Nearing, MD;  Location: [facility] ENDOSCOPY;  Service: Gastroenterology;  Laterality: N/A;    Assessment / Plan / Recommendation Clinical Impression HPI: Pt is a 57 year old female with medical hx significant for: HTN, bipolar disorder, GERD. Pt presented to [facility] on 05/28/23 d/t right-sided weakness and slurred speech x2 days and also falls. 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 Neurology consulted. MRI showed the left caudate nucleus infarct seen on CT was chronic. Acute infarct in left pons noted. CTA head/neck was negative for LVO.   Clinical Impression:  Bedside Swallow Evaluation: A bedside swallow evaluation was administered to assess for s/sx of oropharyngeal dysphagia. Oral mechanism exam WFL. Boluses included thin liquids,  purees and solids. Patient with timely mastication and oral clearance during all trials with no s/sx of aspiration throughout. Recommend continuation of regular/thin diet with medications administered whole in water. Standardized swallowing precautions (small bites/sips, slow rate, upright at 90 degrees.) Communication: Receptive and Expressive Language WFL Cognition: Patient was administered the Cognitive Linguistic Quick Test to assess cognitive linguistic functioning. Patient scored WFL in attention, executive functions, and visual-spatial skills with mild deficits in memory and language. Of note, language scores likely impacted due to poor performance during story retell task due to memory; no language deficits observed. Deficits in awareness seen through patient report no change in cognitive skills since CVA however states ability to complete high level cognitive tasks before admission. Recommend addressing memory, problem solving and awareness due to high level of cognitive functioning prior to admission.  Dysarthria: Patient with mild dysarthria due to imprecise articulation resulting in ~80% intelligibility at the conversational level.  Pt would benefit from skilled ST services to maximize cognition and dysarthria in order to maximize functional independence at d/c. Anticipate patient will require 24 hour supervision at d/c and f/u SLP services.    Skilled Therapeutic Interventions          Patient evaluated using a standardized cognitive linguistic assessment and bedside swallow evaluation to assess current cognitive, communicative and swallowing function. See above for details.   SLP Assessment  Patient will need skilled Speech Lanaguage Pathology Services during CIR admission    Recommendations  SLP Diet Recommendations: Age appropriate regular solids;Thin Medication Administration: Whole meds with liquid Supervision: Patient able to self feed Compensations: Slow rate;Small  sips/bites Postural Changes and/or Swallow Maneuvers: Seated upright 90 degrees Oral Care Recommendations: Oral care BID Patient  destination: Home Follow up Recommendations: Outpatient SLP Equipment Recommended: None recommended by SLP    SLP Frequency 1 to 3 out of 7 days   SLP Duration  SLP Intensity  SLP Treatment/Interventions 7 days  Minumum of 1-2 x/day, 30 to 90 minutes  Cognitive remediation/compensation;Internal/external aids;Cueing hierarchy;Environmental controls;Therapeutic Activities;Functional tasks;Patient/family education;Therapeutic Exercise    Pain None reported   SLP Evaluation Cognition Overall Cognitive Status: Impaired/Different from baseline Arousal/Alertness: Awake/alert Orientation Level: Oriented X4 Memory: Impaired Memory Impairment: Storage deficit;Retrieval deficit Awareness: Impaired Awareness Impairment: Intellectual impairment Problem Solving: Impaired Problem Solving Impairment: Verbal complex;Functional complex  Comprehension Auditory Comprehension Overall Auditory Comprehension: Appears within functional limits for tasks assessed Expression Expression Primary Mode of Expression: Verbal Verbal Expression Overall Verbal Expression: Appears within functional limits for tasks assessed Oral Motor Oral Motor/Sensory Function Overall Oral Motor/Sensory Function: Within functional limits Motor Speech Overall Motor Speech: Impaired Respiration: Within functional limits Resonance: Within functional limits Articulation: Impaired Level of Impairment: Conversation Intelligibility: Intelligibility reduced Conversation: 75-100% accurate  Care Tool Care Tool Cognition Ability to hear (with hearing aid or hearing appliances if normally used Ability to hear (with hearing aid or hearing appliances if normally used): 0. Adequate - no difficulty in normal conservation, social interaction, listening to TV   Expression of Ideas and Wants Expression  of Ideas and Wants: 4. Without difficulty (complex and basic) - expresses complex messages without difficulty and with speech that is clear and easy to understand   Understanding Verbal and Non-Verbal Content Understanding Verbal and Non-Verbal Content: 4. Understands (complex and basic) - clear comprehension without cues or repetitions  Memory/Recall Ability Memory/Recall Ability : That he or she is in a hospital/hospital unit;Current season   Bedside Swallowing Assessment General Previous Swallow Assessment: 10/20 Diet Prior to this Study: Regular;Thin liquids (Level 0) Respiratory Status: Room air History of Recent Intubation: No Behavior/Cognition: Alert;Cooperative Oral Cavity - Dentition: Poor condition;Missing dentition Self-Feeding Abilities: Able to feed self Vision: Functional for self-feeding Patient Positioning: Upright in chair/Tumbleform Volitional Cough: Strong;Congested Volitional Swallow: Able to elicit  Ice Chips Ice chips: Not tested Thin Liquid Thin Liquid: Within functional limits Presentation: Cup;Self Fed;Straw Nectar Thick Nectar Thick Liquid: Not tested Honey Thick Honey Thick Liquid: Not tested Puree Puree: Within functional limits Presentation: Self Fed;Spoon Solid Solid: Within functional limits Presentation: Self Fed BSE Assessment Risk for Aspiration Impact on safety and function: Mild aspiration risk Other Related Risk Factors: History of GERD  Short Term Goals: Week 1: SLP Short Term Goal 1 (Week 1): STG=LTG due to ELOS  Refer to Care Plan for Long Term Goals  Recommendations for other services: None   Discharge Criteria: Patient will be discharged from SLP if patient refuses treatment 3 consecutive times without medical reason, if treatment goals not met, if there is a change in medical status, if patient makes no progress towards goals or if patient is discharged from hospital.  The above assessment, treatment plan, treatment  alternatives and goals were discussed and mutually agreed upon: by patient  Jahmel Flannagan M.A., CF-SLP 06/01/2023, 12:17 PM

## 2023-06-01 NOTE — Progress Notes (Addendum)
  RD consulted for nutrition education regarding diabetes.   Lab Results  Component Value Date   HGBA1C 6.6 (H) 05/29/2023   Nutrition hx-no specific meal pattern, eats when hungry, consumes fast food frequently, minimal food prep at home, however notes her daughter will help with meals.   RD provided "Heart Healthy/Consistent CHO diet nutrition therapy" handout from the Academy of [employer] and Dietetics. Discussed different food groups and their effects on blood sugar, emphasizing carbohydrate-containing foods. A list of carbohydrates and recommended serving sizes of common foods to be provided as well as "The Plate Method".  Discussed importance of controlled and consistent carbohydrate intake throughout the day. Provided examples of ways to balance meals/snacks and encouraged intake of high-fiber, whole grain complex carbohydrates. Teach back method used.  Expect work towards compliance. Patient has not followed modified meal plan previously. She agrees to attend outpatient diabetic self management education class.  Body mass index is 29.21 kg/m.   Current diet order is heart healthy/consistent CHO, patient is consuming approximately 100% of meals at this time. Labs and medications reviewed. No further nutrition interventions warranted at this time. RD contact information provided. If additional nutrition issues arise, please re-consult RD.  Alvino Chapel, RDLD Clinical Dietitian See AMION for contact information

## 2023-06-01 NOTE — Evaluation (Signed)
Occupational Therapy Assessment and Plan  Patient Details  Name: Rachael Aguilar MRN: [id number] Date of Birth: [birth year]  OT Diagnosis: hemiplegia affecting dominant side Rehab Potential: Rehab Potential (ACUTE ONLY): Excellent ELOS: 7-8 days   Today's Date: 06/01/2023 OT Individual Time: 0900-1000 OT Individual Time Calculation (min): 60 min     Hospital Problem: Principal Problem:   Left pontine stroke [facility])   Past Medical History:  Past Medical History:  Diagnosis Date   Bipolar depression (HCC)    Hypertension    Lower GI bleed    Past Surgical History:  Past Surgical History:  Procedure Laterality Date   CHOLECYSTECTOMY N/A 04/02/2016   Procedure: LAPAROSCOPIC CHOLECYSTECTOMY;  Surgeon: Leafy Ro, MD;  Location: [facility] ORS;  Service: General;  Laterality: N/A;   DILATION AND CURETTAGE OF UTERUS     ESOPHAGOGASTRODUODENOSCOPY N/A 11/09/2020   Procedure: ESOPHAGOGASTRODUODENOSCOPY (EGD);  Surgeon: Toledo, Boykin Nearing, MD;  Location: [facility] ENDOSCOPY;  Service: Gastroenterology;  Laterality: N/A;   FLEXIBLE SIGMOIDOSCOPY N/A 11/09/2020   Procedure: FLEXIBLE SIGMOIDOSCOPY;  Surgeon: Toledo, Boykin Nearing, MD;  Location: [facility] ENDOSCOPY;  Service: Gastroenterology;  Laterality: N/A;    Assessment & Plan Clinical Impression: .Rachael Aguilar is a 57 year old female with history of HTN, bipolar disorder?,  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  2D echo showed EF 55 to 60% with no wall abnormality, moderate concentric LV H and negative bubble study.  Dr. Otelia Limes recommended DAPT x 21  days followed by aspirin alone.  She continues to be limited by right-sided weakness affecting mobility and ADLs.  CIR recommended due to functional decline.      Patient transferred to CIR on 05/31/2023 .    Patient currently requires supervision with basic self-care skills and CGA with ambulation and dynamic standing balance secondary to decreased coordination, decreased memory, and decreased standing balance, hemiplegia, and decreased balance strategies.  Prior to hospitalization, patient was fully independent, driving, working.  Patient will benefit from skilled intervention to increase independence with basic self-care skills and increase level of independence with iADL prior to discharge home with care partner.  Anticipate patient will require intermittent supervision and follow up home health.if transportation is an issue, otherwise pt would be most appropriate for out pt.   OT - End of Session Activity Tolerance: Tolerates 30+ min activity without fatigue OT Assessment Rehab Potential (ACUTE ONLY): Excellent OT Patient demonstrates impairments in the following area(s): Balance;Motor;Cognition OT Basic ADL's Functional Problem(s): Bathing;Dressing;Toileting;Grooming OT Advanced ADL's Functional Problem(s): Light Housekeeping;Laundry OT Transfers Functional Problem(s): Tub/Shower;Toilet OT Additional Impairment(s): Fuctional Use of Upper Extremity OT Plan OT Intensity: Minimum of 1-2 x/day, 45 to 90 minutes OT Frequency: 5 out of 7 days;Total of 15 hours over 7 days of combined therapies OT Duration/Estimated Length of Stay: 7-8 days OT Treatment/Interventions: Balance/vestibular training;Cognitive remediation/compensation;DME/adaptive equipment instruction;Discharge planning;Functional mobility training;Neuromuscular re-education;Patient/family education;Psychosocial support;Self Care/advanced ADL retraining;Therapeutic Exercise;Therapeutic Activities;UE/LE Strength taining/ROM;UE/LE  Coordination activities OT Self Feeding Anticipated Outcome(s): independent OT Basic Self-Care Anticipated Outcome(s): independent with dressing, mod I bathing OT Toileting Anticipated Outcome(s): independent OT Bathroom Transfers Anticipated Outcome(s): independent to toilet,  mod I to tub OT Recommendation Patient destination: Home Follow Up Recommendations: Home health OT Equipment Recommended: Tub/shower bench   OT Evaluation Precautions/Restrictions  Precautions Precautions: Fall Restrictions Weight Bearing Restrictions: No   Pain Pain Assessment Pain Score: 0-No pain Home Living/Prior Functioning Home Living Family/patient expects to be discharged to:: Private residence Living Arrangements: Children Available Help at Discharge: Family, Available PRN/intermittently Type of Home: House Home Layout: One level Bathroom Shower/Tub: Tub/shower unit  Lives With: Daughter Prior Function Level of Independence: Independent with basic ADLs, Independent with gait, Independent with homemaking with ambulation, Independent with transfers  Able to Take Stairs?: Yes Driving: Yes Vocation: Full time employment Vocation Requirements: working in a Nurse, adult Baseline Vision/History: 1 Wears glasses Ability to See in Adequate Light: 0 Adequate Patient Visual Report: No change from baseline Vision Assessment?: No apparent visual deficits Perception  Perception: Within Functional Limits Praxis Praxis: WFL Cognition Cognition Overall Cognitive Status: Impaired/Different from baseline Arousal/Alertness: Awake/alert Orientation Level: Person;Place;Situation Person: Oriented Place: Oriented Situation: Oriented Memory: Impaired Memory Impairment: Storage deficit;Retrieval deficit Awareness: Impaired Awareness Impairment: Intellectual impairment Problem Solving: Impaired Problem Solving Impairment: Verbal complex;Functional complex Safety/Judgment: Appears intact Brief  Interview for Mental Status (BIMS) Repetition of Three Words (First Attempt): 3 Temporal Orientation: Year: Correct Temporal Orientation: Month: Accurate within 5 days Temporal Orientation: Day: Correct Recall: "Sock": Yes, no cue required Recall: "Blue": Yes, no cue required Recall: "Bed": No, could not recall BIMS Summary Score: 13 Sensation Sensation Light Touch: Appears Intact Coordination Gross Motor Movements are Fluid and Coordinated: No Fine Motor Movements are Fluid and Coordinated: No Coordination and Movement Description: RUE/RLE weakness Finger Nose Finger Test: 4x in 10 seconds on R hand vs 8 x on L hand.  R hand accurate but slow. Motor  Motor Motor: Hemiplegia  Trunk/Postural Assessment  Postural Control Postural Control: Within Functional Limits  Balance Dynamic Sitting Balance Dynamic Sitting - Level of Assistance: 7: Independent Static Standing Balance Static Standing - Level of Assistance: 5: Stand by assistance Dynamic Standing Balance Dynamic Standing - Level of Assistance: 4: Min assist Dynamic Standing - Balance Activities: Reaching for objects;Lateral lean/weight shifting;Reaching across midline;Forward lean/weight shifting Extremity/Trunk Assessment RUE Assessment Active Range of Motion (AROM) Comments: WFL General Strength Comments: 4-/5 throughout UE LUE Assessment LUE Assessment: Within Functional Limits General Strength Comments: 5/5  Care Tool Care Tool Self Care Eating   Eating Assist Level: Set up assist    Oral Care    Oral Care Assist Level: Supervision/Verbal cueing (standing at sink)    Bathing   Body parts bathed by patient: Right arm;Left arm;Chest;Abdomen;Front perineal area;Buttocks;Right upper leg;Face;Left lower leg;Right lower leg;Left upper leg     Assist Level: Supervision/Verbal cueing    Upper Body Dressing(including orthotics)   What is the patient wearing?: Bra;Pull over shirt   Assist Level: Set up assist     Lower Body Dressing (excluding footwear)   What is the patient wearing?: Underwear/pull up;Pants Assist for lower body dressing: Supervision/Verbal cueing    Putting on/Taking off footwear   What is the patient wearing?: Non-skid slipper socks Assist for footwear: Set up assist       Care Tool Toileting Toileting activity   Assist for toileting: Supervision/Verbal cueing     Care Tool Bed Mobility Roll left and right activity   Roll left and right assist level: Independent    Sit to lying activity   Sit to lying assist level: Independent    Lying to sitting on side of  bed activity   Lying to sitting on side of bed assist level: the ability to move from lying on the back to sitting on the side of the bed with no back support.: Independent     Care Tool Transfers Sit to stand transfer   Sit to stand assist level: Supervision/Verbal cueing    Chair/bed transfer   Chair/bed transfer assist level: Contact Guard/Touching assist     Toilet transfer   Assist Level: Contact Guard/Touching assist     Care Tool Cognition  Expression of Ideas and Wants Expression of Ideas and Wants: 4. Without difficulty (complex and basic) - expresses complex messages without difficulty and with speech that is clear and easy to understand  Understanding Verbal and Non-Verbal Content Understanding Verbal and Non-Verbal Content: 4. Understands (complex and basic) - clear comprehension without cues or repetitions   Memory/Recall Ability Memory/Recall Ability : That he or she is in a hospital/hospital unit;Current season   Refer to Care Plan for Long Term Goals  SHORT TERM GOAL WEEK 1 OT Short Term Goal 1 (Week 1): STGs = LTGs  Recommendations for other services: None    Skilled Therapeutic Intervention ADL ADL Eating: Set up Grooming: Supervision/safety Where Assessed-Grooming: Standing at sink Upper Body Bathing: Setup Where Assessed-Upper Body Bathing: Shower Lower Body Bathing:  Supervision/safety Where Assessed-Lower Body Bathing: Shower Upper Body Dressing: Setup Where Assessed-Upper Body Dressing: Edge of bed Lower Body Dressing: Supervision/safety Where Assessed-Lower Body Dressing: Edge of bed Toileting: Supervision/safety Where Assessed-Toileting: Teacher, adult education: Furniture conservator/restorer Method: Biomedical scientist Method: Designer, industrial/product: Transfer tub bench    Pt seen for initial evaluation and ADL training with a focus on dynamic balance and R side coordination.  Pt moving actively and eager to reach her independent level again soon.  Pt able to stand to doff LB clothing and socks with light hand support on wall and CGA. She was able to use R hand actively to fasten the button and zipper on her tight skinny jeans.  Pt will benefit from R side strengthening and balance activities to improve her level of independence.  Reviewed role of OT, discussed POC and pt's goals, and ELOS. Pt resting in recliner With all needs met and chair alarm set.     Discharge Criteria: Patient will be discharged from OT if patient refuses treatment 3 consecutive times without medical reason, if treatment goals not met, if there is a change in medical status, if patient makes no progress towards goals or if patient is discharged from hospital.  The above assessment, treatment plan, treatment alternatives and goals were discussed and mutually agreed upon: by patient  [facility] 06/01/2023, 12:51 PM

## 2023-06-01 NOTE — Plan of Care (Signed)
Problem: RH Balance Goal: LTG: Patient will maintain dynamic sitting balance (OT) Description: LTG:  Patient will maintain dynamic sitting balance with assistance during activities of daily living (OT) Flowsheets (Taken 06/01/2023 1257) LTG: Pt will maintain dynamic sitting balance during ADLs with: Independent Goal: LTG Patient will maintain dynamic standing with ADLs (OT) Description: LTG:  Patient will maintain dynamic standing balance with assist during activities of daily living (OT)  Flowsheets (Taken 06/01/2023 1257) LTG: Pt will maintain dynamic standing balance during ADLs with: Independent with assistive device   Problem: Sit to Stand Goal: LTG:  Patient will perform sit to stand in prep for activites of daily living with assistance level (OT) Description: LTG:  Patient will perform sit to stand in prep for activites of daily living with assistance level (OT) Flowsheets (Taken 06/01/2023 1257) LTG: PT will perform sit to stand in prep for activites of daily living with assistance level: Independent   Problem: RH Eating Goal: LTG Patient will perform eating w/assist, cues/equip (OT) Description: LTG: Patient will perform eating with assist, with/without cues using equipment (OT) Flowsheets (Taken 06/01/2023 1257) LTG: Pt will perform eating with assistance level of: Independent   Problem: RH Grooming Goal: LTG Patient will perform grooming w/assist,cues/equip (OT) Description: LTG: Patient will perform grooming with assist, with/without cues using equipment (OT) Flowsheets (Taken 06/01/2023 1257) LTG: Pt will perform grooming with assistance level of: Independent   Problem: RH Bathing Goal: LTG Patient will bathe all body parts with assist levels (OT) Description: LTG: Patient will bathe all body parts with assist levels (OT) Flowsheets (Taken 06/01/2023 1257) LTG: Pt will perform bathing with assistance level/cueing: Independent with assistive device  LTG: Position pt  will perform bathing: Shower   Problem: RH Dressing Goal: LTG Patient will perform upper body dressing (OT) Description: LTG Patient will perform upper body dressing with assist, with/without cues (OT). Flowsheets (Taken 06/01/2023 1257) LTG: Pt will perform upper body dressing with assistance level of: Independent Goal: LTG Patient will perform lower body dressing w/assist (OT) Description: LTG: Patient will perform lower body dressing with assist, with/without cues in positioning using equipment (OT) Flowsheets (Taken 06/01/2023 1257) LTG: Pt will perform lower body dressing with assistance level of: Independent   Problem: RH Toileting Goal: LTG Patient will perform toileting task (3/3 steps) with assistance level (OT) Description: LTG: Patient will perform toileting task (3/3 steps) with assistance level (OT)  Flowsheets (Taken 06/01/2023 1257) LTG: Pt will perform toileting task (3/3 steps) with assistance level: Independent   Problem: RH Functional Use of Upper Extremity Goal: LTG Patient will use RT/LT upper extremity as a (OT) Description: LTG: Patient will use right/left upper extremity as a stabilizer/gross assist/diminished/nondominant/dominant level with assist, with/without cues during functional activity (OT) Flowsheets (Taken 06/01/2023 1257) LTG: Use of upper extremity in functional activities: RUE as dominant level LTG: Pt will use upper extremity in functional activity with assistance level of: Independent   Problem: RH Laundry Goal: LTG Patient will perform laundry w/assist, cues (OT) Description: LTG: Patient will perform laundry with assistance, with/without cues (OT). Flowsheets (Taken 06/01/2023 1257) LTG: Pt will perform laundry with assistance level of: Independent with assistive device   Problem: RH Light Housekeeping Goal: LTG Patient will perform light housekeeping w/assist (OT) Description: LTG: Patient will perform light housekeeping with assistance,  with/without cues (OT). Flowsheets (Taken 06/01/2023 1257) LTG: Pt will perform light housekeeping with assistance level of: Independent with assistive device   Problem: RH Toilet Transfers Goal: LTG Patient will perform toilet transfers w/assist (  OT) Description: LTG: Patient will perform toilet transfers with assist, with/without cues using equipment (OT) Flowsheets (Taken 06/01/2023 1257) LTG: Pt will perform toilet transfers with assistance level of: Independent   Problem: RH Tub/Shower Transfers Goal: LTG Patient will perform tub/shower transfers w/assist (OT) Description: LTG: Patient will perform tub/shower transfers with assist, with/without cues using equipment (OT) Flowsheets (Taken 06/01/2023 1257) LTG: Pt will perform tub/shower stall transfers with assistance level of: Independent with assistive device LTG: Pt will perform tub/shower transfers from: Tub/shower combination

## 2023-06-01 NOTE — Progress Notes (Signed)
PROGRESS NOTE   Subjective/Complaints:  No issues overnight   ROS- no CP, SOB, N/V/D  Objective:   No results found. Recent Labs    06/01/23 0454  WBC 6.2  HGB 14.9  HCT 44.2  PLT 286   Recent Labs    05/31/23 1413 06/01/23 0454  NA 141 140  K 3.9 3.5  CL 106 107  CO2 25 23  GLUCOSE 117* 127*  BUN 17 18  CREATININE 0.79 0.69  CALCIUM 9.4 9.4    Intake/Output Summary (Last 24 hours) at 06/01/2023 0913 Last data filed at 06/01/2023 0731 Gross per 24 hour  Intake 360 ml  Output --  Net 360 ml        Physical Exam: Vital Signs Blood pressure 122/81, pulse 86, temperature (!) 97.3 F (36.3 C), temperature source Oral, resp. rate 17, height 5\' 4"  (1.626 m), weight 77.2 kg, last menstrual period 10/13/2020, SpO2 98%.   General: No acute distress Mood and affect are appropriate Heart: Regular rate and rhythm no rubs murmurs or extra sounds Lungs: Clear to auscultation, breathing unlabored, no rales or wheezes Abdomen: Positive bowel sounds, soft nontender to palpation, nondistended Extremities: No clubbing, cyanosis, or edema Skin: No evidence of breakdown, no evidence of rash Neurologic: Cranial nerves II through XII intact, motor strength is 5/5 inleft and 2- RIght deltoid, bicep, tricep, grip,5/5 left and 3/5 Right  hip flexor, 5/5 left and 4/5 RIght knee extensors, 5/5 left and 3/5 right ankle dorsiflexor and 5/5 l;eft and 4/5 right plantar flexor Sensory exam normal sensation to light touch and proprioception in bilateral upper and lower extremities Cerebellar exam normal left side , mild dysmetria R FNF Musculoskeletal: Full range of motion in all 4 extremities. No joint swelling   Assessment/Plan: 1. Functional deficits which require 3+ hours per day of interdisciplinary therapy in a comprehensive inpatient rehab setting. Physiatrist is providing close team supervision and 24 hour management of  active medical problems listed below. Physiatrist and rehab team continue to assess barriers to discharge/monitor patient progress toward functional and medical goals  Care Tool:  Bathing              Bathing assist       Upper Body Dressing/Undressing Upper body dressing        Upper body assist      Lower Body Dressing/Undressing Lower body dressing            Lower body assist       Toileting Toileting    Toileting assist       Transfers Chair/bed transfer  Transfers assist           Locomotion Ambulation   Ambulation assist              Walk 10 feet activity   Assist           Walk 50 feet activity   Assist           Walk 150 feet activity   Assist           Walk 10 feet on uneven surface  activity   Assist  Wheelchair     Assist               Wheelchair 50 feet with 2 turns activity    Assist            Wheelchair 150 feet activity     Assist          Blood pressure 122/81, pulse 86, temperature (!) 97.3 F (36.3 C), temperature source Oral, resp. rate 17, height 5\' 4"  (1.626 m), weight 77.2 kg, last menstrual period 10/13/2020, SpO2 98%.  Medical Problem List and Plan: 1. 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  Antithrombotics: -DVT/anticoagulation:  Pharmaceutical: Lovenox             -antiplatelet therapy: DAPT x 3 weeks followed by ASA alone. 3. Pain Management: tylenol prn.  4. Mood/Behavior/Sleep:  LCSW to follow for evaluation and support.             --Melatonin prn for sleep.              -antipsychotic agents: N/A 5. Neuropsych/cognition: This patient is capable of making decisions on his own behalf. 6. Skin/Wound Care: Routine pressure relief measures.  7. Fluids/Electrolytes/Nutrition:  Monitor I/O. Check CMET in am   8. HTN: Monitor BP TID--continue Norvasc and Cozaar (question compliant PTA)            Vitals:   05/31/23 1943 06/01/23 0519  BP: (!) 146/97 122/81  Pulse: 86 86  Resp:  17  Temp: 98.5 F (36.9 C) (!) 97.3 F (36.3 C)  SpO2: 99% 98%    10. T2DM: Hgb A1C- 6.6 and new diagonsis. Will monitor BS ac/hs and use SSI for now --change diet to Carb Mod and consult RD for diet education CBG (last 3)  Recent Labs    05/31/23 2152 06/01/23 0622  GLUCAP 157* 139*    11. GERD: Resume PPI 12. Intermittent hypokalemia: Recheck electrolytes in AM. 13.. Bereavement: Lost a friend recently.    LOS: 1 days A FACE TO FACE EVALUATION WAS PERFORMED  Erick Colace 06/01/2023, 9:13 AM

## 2023-06-01 NOTE — Patient Care Conference (Signed)
Inpatient RehabilitationTeam Conference and Plan of Care Update Date: 06/01/2023   Time: 10:10 AM    Patient Name: Rachael Aguilar      Medical Record Number: [id number]  Date of Birth: [birth year] Sex: Female         Room/Bed: 4M04C/4M04C-01 Payor Info: Payor: MEDICAID POTENTIAL / Plan: MEDICAID POTENTIAL / Product Type: *No Product type* /    Admit Date/Time:  05/31/2023  1:39 PM  Primary Diagnosis:  Left pontine stroke [facility])  Hospital Problems: Principal Problem:   Left pontine stroke [facility])    Expected Discharge Date: Expected Discharge Date:  (evals pending)  Team Members Present: Physician leading conference: Dr. Claudette Laws Social Worker Present: Lavera Guise, BSW Nurse Present: Chana Bode, RN PT Present: Ralph Leyden, PT OT Present: Primitivo Gauze, OT SLP Present: Everardo Pacific, SLP     Current Status/Progress Goal Weekly Team Focus  Bowel/Bladder   patient is continent of b/b   maintan continence   offer toileting qshift and PRN    Swallow/Nutrition/ Hydration   eval pending           ADL's   CGA overall with mobility, supervision with self care   mod I to independent levels   R side strength, balance, pt education    Mobility   eval pending           Communication   eval pending            Safety/Cognition/ Behavioral Observations  eval pending            Pain   no c/o pain   maintain pain free   assess pain qshift and PRN    Skin   skin is intact   maintain skin integrity  assess skin q shift and PRN encouraging movement out of bed and q2 turns      Discharge Planning:  New admission. Assesment pending   Team Discussion: Patient post left pontine CVA with right hemiparesis. Hx. Of uncontrolled HTN with new dx.  Of Diabetes.  Has full active ROM to right side with right hip weakness.  Patient on target to meet rehab goals: Evals pending  *See Care Plan and progress notes for long and short-term goals.    Revisions to Treatment Plan:  SLP eval pending poor score on SLUMs test PTA to CIR   Teaching Needs: Safety, medications, dietary modification, transfers, etc.   Current Barriers to Discharge: Home enviroment access/layout, Insurance for SNF coverage, and lack of insurance for follow up services  Possible Resolutions to Barriers: Family education OP follow up services DME: TTB     Medical Summary Current Status: Newly diagnosed diabetic, noncompliant hypertension medications at home.  Right-sided weakness and decreased coordination  Barriers to Discharge: Uncontrolled Diabetes;Uncontrolled Hypertension   Possible Resolutions to [employer] Focus: Stroke education, medication management for diabetes and hypertension   Continued Need for Acute Rehabilitation Level of Care: The patient requires daily medical management by a physician with specialized training in physical medicine and rehabilitation for the following reasons: Direction of a multidisciplinary physical rehabilitation program to maximize functional independence : Yes Medical management of patient stability for increased activity during participation in an intensive rehabilitation regime.: Yes Analysis of laboratory values and/or radiology reports with any subsequent need for medication adjustment and/or medical intervention. : Yes   I attest that I was present, lead the team conference, and concur with the assessment and plan of the team.   Chana Bode B 06/01/2023, 1:31 PM

## 2023-06-01 NOTE — Plan of Care (Signed)
  Problem: Consults Goal: RH STROKE PATIENT EDUCATION Description: See Patient Education module for education specifics  Outcome: Progressing   Problem: RH SAFETY Goal: RH STG ADHERE TO SAFETY PRECAUTIONS W/ASSISTANCE/DEVICE Description: STG Adhere to Safety Precautions With cues Assistance/Device. Outcome: Progressing   Problem: RH KNOWLEDGE DEFICIT Goal: RH STG INCREASE KNOWLEDGE OF DIABETES Description: Patient and family will be able to manage prediabetes with dietary modifications using educational resources independently Outcome: Progressing Goal: RH STG INCREASE KNOWLEDGE OF HYPERTENSION Description: Patient and family will be able to manage HTN with medications and dietary modifications using educational resources independently Outcome: Progressing Goal: RH STG INCREASE KNOWLEGDE OF HYPERLIPIDEMIA Description: Patient and family will be able to manage HLD with medications and dietary modifications using educational resources independently Outcome: Progressing Goal: RH STG INCREASE KNOWLEDGE OF STROKE PROPHYLAXIS Description: Patient and family will be able to manage secondary risks with medications and dietary modifications using educational resources independently Outcome: Progressing   Problem: Education: Goal: Ability to describe self-care measures that may prevent or decrease complications (Diabetes Survival Skills Education) will improve Outcome: Progressing Goal: Individualized Educational Video(s) Outcome: Progressing   Problem: Coping: Goal: Ability to adjust to condition or change in health will improve Outcome: Progressing   Problem: Fluid Volume: Goal: Ability to maintain a balanced intake and output will improve Outcome: Progressing   Problem: Health Behavior/Discharge Planning: Goal: Ability to identify and utilize available resources and services will improve Outcome: Progressing Goal: Ability to manage health-related needs will improve Outcome:  Progressing   Problem: Metabolic: Goal: Ability to maintain appropriate glucose levels will improve Outcome: Progressing   Problem: Nutritional: Goal: Maintenance of adequate nutrition will improve Outcome: Progressing Goal: Progress toward achieving an optimal weight will improve Outcome: Progressing   Problem: Skin Integrity: Goal: Risk for impaired skin integrity will decrease Outcome: Progressing   Problem: Tissue Perfusion: Goal: Adequacy of tissue perfusion will improve Outcome: Progressing

## 2023-06-01 NOTE — Plan of Care (Signed)
  Problem: RH Expression Communication Goal: LTG Patient will increase speech intelligibility (SLP) Description: LTG: Patient will increase speech intelligibility at word/phrase/conversation level with cues, % of the time (SLP) Flowsheets (Taken 06/01/2023 1229) LTG: Patient will increase speech intelligibility (SLP): Supervision Level: Conversation level Percent of time patient will use intelligible speech: 95   Problem: RH Problem Solving Goal: LTG Patient will demonstrate problem solving for (SLP) Description: LTG:  Patient will demonstrate problem solving for basic/complex daily situations with cues  (SLP) Flowsheets (Taken 06/01/2023 1229) LTG: Patient will demonstrate problem solving for (SLP): (mildly complex) Other (comment) LTG Patient will demonstrate problem solving for: Supervision   Problem: RH Memory Goal: LTG Patient will use memory compensatory aids to (SLP) Description: LTG:  Patient will use memory compensatory aids to recall biographical/new, daily complex information with cues (SLP) Flowsheets (Taken 06/01/2023 1229) LTG: Patient will use memory compensatory aids to (SLP): Supervision   Problem: RH Awareness Goal: LTG: Patient will demonstrate awareness during functional activites type of (SLP) Description: LTG: Patient will demonstrate awareness during functional activites type of (SLP) Flowsheets (Taken 06/01/2023 1229) Patient will demonstrate during cognitive/linguistic activities awareness type of: Intellectual LTG: Patient will demonstrate awareness during cognitive/linguistic activities with assistance of (SLP): Supervision

## 2023-06-02 LAB — GLUCOSE, CAPILLARY
Glucose-Capillary: 128 mg/dL — ABNORMAL HIGH (ref 70–99)
Glucose-Capillary: 139 mg/dL — ABNORMAL HIGH (ref 70–99)
Glucose-Capillary: 117 mg/dL — ABNORMAL HIGH (ref 70–99)
Glucose-Capillary: 212 mg/dL — ABNORMAL HIGH (ref 70–99)

## 2023-06-02 NOTE — Progress Notes (Signed)
Occupational Therapy Session Note  Patient Details  Name: Rachael Aguilar MRN: [id number] Date of Birth: 12/27/1965  Today's Date: 06/02/2023 OT Individual Time: 0805-0900 OT Individual Time Calculation (min): 55 min    Short Term Goals: Week 1:  OT Short Term Goal 1 (Week 1): STGs = LTGs  Skilled Therapeutic Interventions/Progress Updates:     Pt received semi-reclined in bed presenting to be in good spirits receptive to skilled OT session reporting 0/10 pain- OT offering intermittent rest breaks, repositioning, and therapeutic support to optimize participation in therapy session. Pt requesting BR and shower this AM. Focus this session dynamic balance, BADL retraining, and R UE functional use within the context of shower level BADLs. Pt transitioned to EOB with supervision and completed functional mobility to BR no AD with CGA- Pt utilizing furniture around room for balance. Pt transferred to toilet with CGA using grab bars for balance and completed 3/3 toileting tasks with close supervision- continent void documented in flowsheets. Pt completed ambulatory transfer into walk-in shower with CGA. During shower, worked on dynamic standing balance with Pt able to complete U/LB bathing in standing position for entirety of shower with close supervision without LOB. Pt able to utilize R UE during ~75% of bathing tasks with min verbal cues. Following shower, Pt completed short distance functional mobility back to her room no AD CGA for balance with Pt noted to utilize "step-to" gait pattern d/t R-sided balance deficits. Pt applied lotion in seated position supervision. Pt able to complete UB dressing with supervision and LB dressing close supervision for safety and balance. Pt able to cross B LEs into figure-four position to don shoes with mod A required for R shoe d/t tight fit of shoe. Pt able to tie both shoes with supervision and increased time. RN in/out to provide morning medications. Pt completed  functional mobility to sink no AD with CGA and maintained dynamic standing balance with close supervision while completing oral hygiene tasks.  Engaged Pt in completing functional mobility to therapy gym without AD for endurance and balance training with CGA to light min A required for balance and mod verbal cues to initiate leading with heel vs flat foot during ambulation.  Engaged Pt in seated B UE exercises using 4# weighted dowel to increase overall strength, endurance, and reciprocal arm movements for increased functional use during BADLs. Pt able to complete 1x10 reps of the following exercises with short rest breaks provided between trials:  -Bicep flexion -Anterior shoulder flexion -Overhead press -Anterior chest press Pt completed functional mobility back to room with light min HHA provided for balance and mod verbal cues to initiate leading steps with heel and to facilitate step-through pattern.  Pt was left resting in recliner with call bell in reach, seatbelt alarm on, and all needs met.    Therapy Documentation Precautions:  Precautions Precautions: Fall Precaution Comments: mild R hemipareisis Restrictions Weight Bearing Restrictions: No   Therapy/Group: Individual Therapy  Clide Deutscher 06/02/2023, 7:50 AM

## 2023-06-02 NOTE — Progress Notes (Signed)
Speech Language Pathology Daily Session Note  Patient Details  Name: Rachael Aguilar MRN: [id number] Date of Birth: 11/07/1965  Today's Date: 06/02/2023 SLP Individual Time: 1400-1445 SLP Individual Time Calculation (min): 45 min  Short Term Goals: Week 1: SLP Short Term Goal 1 (Week 1): STG=LTG due to ELOS  Skilled Therapeutic Interventions: Skilled therapy session focused on cognitive goals. SLP faciliated session by reviewing patients current medications and instructions for use. According to instructions, patient completed BID pill box accurately with modiA from clinician. Upon completion of pill box, SLP provided supervision-minA for patient to identify medication mistakes and correct them. SLP provided  minA to name each mediciation and reason for use. SLP encouraged patient to utilize BID pill box to organize medications post d/c to ensure AM/PM medications are taken accordingly. Patient left in chair with alarm set and call bell in reach. Continue POC.   Pain No pain reported  Therapy/Group: Individual Therapy  Elois Averitt M.A., CF-SLP 06/02/2023, 3:34 PM

## 2023-06-02 NOTE — Evaluation (Signed)
Physical Therapy Assessment and Plan  Patient Details  Name: Rachael Aguilar MRN: [id number] Date of Birth: 07/02/1966  PT Diagnosis: Difficulty walking, Dizziness and giddiness, Hemiparesis dominant, Impaired cognition, and Muscle weakness Rehab Potential: Good ELOS: 7-10 days   Today's Date: 06/01/2023 PT Individual Time:  4098-1191  PT Individual Time Calculation (min): 74 min Hospital Problem: Principal Problem:   Left pontine stroke [facility])   Past Medical History:  Past Medical History:  Diagnosis Date   Bipolar depression (HCC)    Hypertension    Lower GI bleed    Past Surgical History:  Past Surgical History:  Procedure Laterality Date   CHOLECYSTECTOMY N/A 04/02/2016   Procedure: LAPAROSCOPIC CHOLECYSTECTOMY;  Surgeon: Leafy Ro, MD;  Location: [facility] ORS;  Service: General;  Laterality: N/A;   DILATION AND CURETTAGE OF UTERUS     ESOPHAGOGASTRODUODENOSCOPY N/A 11/09/2020   Procedure: ESOPHAGOGASTRODUODENOSCOPY (EGD);  Surgeon: Toledo, Boykin Nearing, MD;  Location: [facility] ENDOSCOPY;  Service: Gastroenterology;  Laterality: N/A;   FLEXIBLE SIGMOIDOSCOPY N/A 11/09/2020   Procedure: FLEXIBLE SIGMOIDOSCOPY;  Surgeon: Toledo, Boykin Nearing, MD;  Location: [facility] ENDOSCOPY;  Service: Gastroenterology;  Laterality: N/A;    Assessment & Plan Clinical Impression: Patient is a 57 y.o. female with history of HTN, bipolar disorder?, LGIB '22; was admitted to [facility] on 05/28/2023 with right-sided weakness and slurred speech of 48 hours duration and elevated BP 192/108. 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 2D echo showed EF 55 to 60% with no wall abnormality, moderate concentric LV H and negative bubble study. Dr. Otelia Limes  recommended DAPT x 21 days followed by aspirin alone. She continues to be limited by right-sided weakness affecting mobility and ADLs. CIR recommended due to functional decline. Patient transferred to CIR on 05/31/2023 .   Patient currently requires  CGA  with mobility secondary to muscle weakness, decreased cardiorespiratoy endurance, unbalanced muscle activation, decreased safety awareness and decreased memory, and decreased standing balance, decreased balance strategies, and hemipareisis .  Prior to hospitalization, patient was independent  with mobility, working, and lived with Daughter, Family (will be moving in with sister on d/c) in a House home.  Home access is 4Stairs to enter.  Patient will benefit from skilled PT intervention to maximize safe functional mobility, minimize fall risk, and decrease caregiver burden for planned discharge home with 24 hour supervision.  Anticipate patient will benefit from follow up OP at discharge.  PT - End of Session Activity Tolerance: Tolerates 30+ min activity with multiple rests Endurance Deficit: Yes PT Assessment Rehab Potential (ACUTE/IP ONLY): Good PT Barriers to Discharge: Inaccessible home environment;New diabetic;Lack of/limited family support;Insurance for SNF coverage PT Patient demonstrates impairments in the following area(s): Balance;Endurance;Motor;Safety;Perception PT Transfers Functional Problem(s): Bed Mobility;Bed to Chair;Car;Furniture;Floor PT Locomotion Functional Problem(s): Ambulation;Stairs PT Plan PT Intensity: Minimum of 1-2 x/day ,45 to 90 minutes PT Frequency: 5 out of 7 days PT Duration Estimated Length of Stay: 7-10 days PT Treatment/Interventions: Ambulation/gait training;DME/adaptive equipment instruction;Neuromuscular re-education;Psychosocial support;Stair training;Community reintegration;UE/LE Strength taining/ROM;Balance/vestibular training;Discharge planning;Pain management;Therapeutic Activities;UE/LE Coordination  activities;Cognitive remediation/compensation;Disease management/prevention;Functional mobility training;Patient/family education;Splinting/orthotics;Therapeutic Exercise;Visual/perceptual remediation/compensation PT Transfers Anticipated Outcome(s): Mod I/ IND PT Locomotion Anticipated Outcome(s): IND/ SUP PT Recommendation Recommendations for Other Services: Therapeutic Recreation consult Therapeutic Recreation Interventions: Warehouse manager;Outing/community reintergration Follow Up  Recommendations: Outpatient PT;24 hour supervision/assistance Patient destination: Home Equipment Recommended: To be determined   PT Evaluation Precautions/Restrictions Precautions Precautions: Fall Precaution Comments: mild R hemipareisis Restrictions Weight Bearing Restrictions: No General   Vital SignsTherapy Vitals Temp: 98.2 F (36.8 C) Temp Source: Oral Pulse Rate: 89 Resp: 18 BP: 111/78 Patient Position (if appropriate): Lying Oxygen Therapy SpO2: 99 % O2 Device: Room Air Pain Pain Assessment Pain Scale: 0-10 Pain Score: 0-No pain Pain Interference Pain Interference Pain Effect on Sleep: 0. Does not apply - I have not had any pain or hurting in the past 5 days Pain Interference with Therapy Activities: 0. Does not apply - I have not received rehabilitationtherapy in the past 5 days Pain Interference with Day-to-Day Activities: 1. Rarely or not at all Home Living/Prior Functioning Home Living Available Help at Discharge: Family;Available PRN/intermittently Type of Home: House Home Access: Stairs to enter [employer] of Steps: 4 Home Layout: One level Bathroom Shower/Tub: Nurse, adult Accessibility: Yes  Lives With: Daughter;Family (will be moving in with sister on d/c) Prior Function Level of Independence: Independent with basic ADLs;Independent with gait;Independent with homemaking with ambulation;Independent with transfers  Able to Take  Stairs?: Yes Driving: Yes Vocation: Full time employment Vocation Requirements: working in a Ocean[employer] - History Ability to See in Adequate Light: 0 Adequate Vision - Assessment Eye Alignment: Within Functional Limits Ocular Range of Motion: Within Functional Limits Alignment/Gaze Preference: Within Defined Limits Tracking/Visual Pursuits: Able to track stimulus in all quads without difficulty Perception Perception: Within Functional Limits Praxis Praxis: WFL  Cognition Overall Cognitive Status: Within Functional Limits for tasks assessed Arousal/Alertness: Awake/alert Orientation Level: Oriented X4 Memory: Impaired Awareness: Impaired Safety/Judgment: Appears intact Sensation Sensation Light Touch: Appears Intact Coordination Gross Motor Movements are Fluid and Coordinated: No Fine Motor Movements are Fluid and Coordinated: No Heel Shin Test: LLE WFL, RLE slow to perform Motor  Motor Motor: Other (comment) (hemipareisis) Motor - Skilled Clinical Observations: R sided hemipareisis   Trunk/Postural Assessment  Cervical Assessment Cervical Assessment: Within Functional Limits Thoracic Assessment Thoracic Assessment: Within Functional Limits Lumbar Assessment Lumbar Assessment: Within Functional Limits Postural Control Postural Control: Within Functional Limits  Balance Balance Balance Assessed: Yes Standardized Balance Assessment Standardized Balance Assessment: Berg Balance Test;Functional Gait Assessment Berg Balance Test Sit to Stand: Able to stand without using hands and stabilize independently Standing Unsupported: Able to stand 2 minutes with supervision Sitting with Back Unsupported but Feet Supported on Floor or Stool: Able to sit safely and securely 2 minutes Stand to Sit: Controls descent by using hands Transfers: Able to transfer safely, definite need of hands Standing Unsupported with Eyes Closed: Able to stand 10 seconds  safely Standing Ubsupported with Feet Together: Able to place feet together independently and stand for 1 minute with supervision From Standing, Reach Forward with Outstretched Arm: Can reach forward >12 cm safely (5") From Standing Position, Pick up Object from Floor: Able to pick up shoe, needs supervision From Standing Position, Turn to Look Behind Over each Shoulder: Looks behind from both sides and weight shifts well Turn 360 Degrees: Able to turn 360 degrees safely but slowly Standing Unsupported, Alternately Place Feet on Step/Stool: Able to stand independently and complete 8 steps >20 seconds Standing Unsupported, One Foot in Front: Able to plae foot ahead of the other independently and hold 30 seconds Standing on One Leg: Able to lift leg independently and hold 5-10 seconds Total Score: 45 Static Sitting Balance Static Sitting - Balance Support:  Feet supported Static Sitting - Level of Assistance: 7: Independent Dynamic Sitting Balance Dynamic Sitting - Balance Support: Feet supported Dynamic Sitting - Level of Assistance: 7: Independent;6: Modified independent (Device/Increase time) Static Standing Balance Static Standing - Balance Support: During functional activity;No upper extremity supported Static Standing - Level of Assistance: 5: Stand by assistance Dynamic Standing Balance Dynamic Standing - Balance Support: During functional activity;No upper extremity supported Dynamic Standing - Level of Assistance: 4: Min assist Dynamic Standing - Balance Activities: Lateral lean/weight shifting;Reaching across midline;Forward lean/weight shifting;Reaching for weighted objects Functional Gait  Assessment Gait assessed : Yes Gait Level Surface: Walks 20 ft in less than 7 sec but greater than 5.5 sec, uses assistive device, slower speed, mild gait deviations, or deviates 6-10 in outside of the 12 in walkway width. Change in Gait Speed: Able to change speed, demonstrates mild gait  deviations, deviates 6-10 in outside of the 12 in walkway width, or no gait deviations, unable to achieve a major change in velocity, or uses a change in velocity, or uses an assistive device. Gait with Horizontal Head Turns: Performs head turns smoothly with slight change in gait velocity (eg, minor disruption to smooth gait path), deviates 6-10 in outside 12 in walkway width, or uses an assistive device. Gait with Vertical Head Turns: Performs task with slight change in gait velocity (eg, minor disruption to smooth gait path), deviates 6 - 10 in outside 12 in walkway width or uses assistive device Gait and Pivot Turn: Pivot turns safely in greater than 3 sec and stops with no loss of balance, or pivot turns safely within 3 sec and stops with mild imbalance, requires small steps to catch balance. Step Over Obstacle: Is able to step over one shoe box (4.5 in total height) without changing gait speed. No evidence of imbalance. Gait with Narrow Base of Support: Ambulates 4-7 steps. Gait with Eyes Closed: Walks 20 ft, uses assistive device, slower speed, mild gait deviations, deviates 6-10 in outside 12 in walkway width. Ambulates 20 ft in less than 9 sec but greater than 7 sec. Ambulating Backwards: Walks 20 ft, uses assistive device, slower speed, mild gait deviations, deviates 6-10 in outside 12 in walkway width. Steps: Alternating feet, must use rail. Total Score: 19 FGA comment:: 19-24 = medium fall risk Extremity Assessment      RLE Assessment RLE Assessment: Exceptions to [facility] General Strength Comments: grossly 4-/5 with 3+/5 for ankle DF LLE Assessment LLE Assessment: Within Functional Limits General Strength Comments: grossly 4 to 4+/5 rpoximal to distal  Care Tool Care Tool Bed Mobility Roll left and right activity   Roll left and right assist level: Independent    Sit to lying activity   Sit to lying assist level: Independent    Lying to sitting on side of bed activity   Lying to  sitting on side of bed assist level: the ability to move from lying on the back to sitting on the side of the bed with no back support.: Independent     Care Tool Transfers Sit to stand transfer   Sit to stand assist level: Supervision/Verbal cueing    Chair/bed transfer   Chair/bed transfer assist level: Contact Guard/Touching assist    Car transfer   Car transfer assist level: Contact Guard/Touching assist      Care Tool Locomotion Ambulation   Assist level: Contact Guard/Touching assist Assistive device: No Device Max distance: 200 ft  Walk 10 feet activity   Assist level: Contact Guard/Touching assist  Assistive device: No Device   Walk 50 feet with 2 turns activity   Assist level: Contact Guard/Touching assist Assistive device: No Device  Walk 150 feet activity   Assist level: Contact Guard/Touching assist Assistive device: No Device  Walk 10 feet on uneven surfaces activity   Assist level: Contact Guard/Touching assist Assistive device:  (none)  Stairs   Assist level: Contact Guard/Touching assist Stairs assistive device: 2 hand rails Max number of stairs: 12  Walk up/down 1 step activity   Walk up/down 1 step (curb) assist level: Contact Guard/Touching assist Walk up/down 1 step or curb assistive device: 2 hand rails  Walk up/down 4 steps activity   Walk up/down 4 steps assist level: Contact Guard/Touching assist Walk up/down 4 steps assistive device: 2 hand rails  Walk up/down 12 steps activity   Walk up/down 12 steps assist level: Contact Guard/Touching assist Walk up/down 12 steps assistive device: 2 hand rails  Pick up small objects from floor   Pick up small object from the floor assist level: Contact Guard/Touching assist    Wheelchair Is the patient using a wheelchair?: No   Wheelchair activity did not occur: N/A      Wheel 50 feet with 2 turns activity Wheelchair 50 feet with 2 turns activity did not occur: N/A    Wheel 150 feet activity Wheelchair  150 feet activity did not occur: N/A      Refer to Care Plan for Long Term Goals  SHORT TERM GOAL WEEK 1 PT Short Term Goal 1 (Week 1): STG = LTG d/t ELOS  Recommendations for other services: Therapeutic Recreation  Stress management and Outing/community reintegration  Skilled Therapeutic Intervention Mobility Bed Mobility Bed Mobility: Sit to Supine;Supine to Sit Supine to Sit: Independent with assistive device Sit to Supine: Independent with assistive device Transfers Transfers: Sit to Stand;Stand to Sit;Stand Pivot Transfers Sit to Stand: Supervision/Verbal cueing Stand to Sit: Supervision/Verbal cueing Stand Pivot Transfers: Contact Guard/Touching assist Transfer (Assistive device): None Locomotion  Gait Ambulation: Yes Gait Assistance: Contact Guard/Touching assist Gait Distance (Feet): 200 Feet Assistive device: None Gait Gait: Yes Gait Pattern: Impaired Gait Pattern: Step-through pattern;Poor foot clearance - right;Narrow base of support;Right flexed knee in stance;Decreased dorsiflexion - right;Decreased step length - right Gait velocity: Decreased at 0.35 m/s Stairs / Additional Locomotion Stairs: Yes Stairs Assistance: Contact Guard/Touching assist;Supervision/Verbal cueing Stair Management Technique: Two rails Number of Stairs: 12 Height of Stairs: 6 Wheelchair Mobility Wheelchair Mobility: No  Skilled Intervention: PT Evaluation completed; see above for results. PT educated patient in roles of PT vs OT, PT POC, rehab potential, rehab goals, and discharge recommendations along with recommendation for follow-up rehabilitation services. Individual treatment initiated:  Patient seated upright in recliner upon PT arrival. Patient alert and agreeable to PT session. No pain complaint during session.  Therapeutic Activity: Transfers: Patient performed sit <> stand and stand pivot transfers with CGA/ close supervision. Provided vc/tc for appropriate positioning,  weight shift and improves to supervision.  Car transfer requires CGA with vc for technique.   Gait Training:  Patient ambulated >200 feet per bout using no AD with CGA and improves during session with intermittent close supervision. Demonstrated decreased step length and foot clearance and compensates with mild steppage gait pattern. With vc for heel to toe progression with increased step height/ length, pt is able to progress but unable to hold throughout without vc.  Guided in stair navigation and provided with vc for initial bout of four 6" steps leading in ascent  with LLE and step-to progression. Descends leading with RLE. Pt switches lead LE on 2nd bout and demos need for increased BUE support to rise onto step using RLE. 3rd bout of four steps with reciprocal step progression to ascend and step-to with RLE leading. Pt's sister's home has 12-15 steps to enter and will need to ascend potentially with one HR.   Neuromuscular Re-ed: NMR facilitated during session with focus on standing balance. Pt guided in Berg Balance Test and Functional Gait Assessment.  Patient demonstrates increased fall risk as noted by score of  45/56 on Berg Balance Scale.  (<36= high risk for falls, close to 100%; 37-45 significant >80%; 46-51 moderate >50%; 52-55 lower >25%)   Patient demonstrates increased fall risk as noted by score of 19/30 on  Functional Gait Assessment.  19-24 = medium fall risk  MCID: 5 points stroke population, 4 points geriatric population (ANPTA Core Set of Outcome Measures for Adults with Neurologic Conditions, 2018)   Pt also guided in increased motor control in sit<>stand using rolling stool. With cueing for positioning, pt is able to perform x3 with decreasing movement of stool.   NMR performed for improvements in motor control and coordination, balance, sequencing, judgement, and self confidence/ efficacy in performing all aspects of mobility at highest level of independence.   Patient  seated upright in recliner at end of session with brakes locked, belt alarm set, and all needs within reach.  Discharge Criteria: Patient will be discharged from PT if patient refuses treatment 3 consecutive times without medical reason, if treatment goals not met, if there is a change in medical status, if patient makes no progress towards goals or if patient is discharged from hospital.  The above assessment, treatment plan, treatment alternatives and goals were discussed and mutually agreed upon: by patient  Loel Dubonnet PT, DPT, CSRS 06/01/2023, 3:54 PM

## 2023-06-02 NOTE — Progress Notes (Addendum)
Physical Therapy Session Note  Patient Details  Name: Rachael Aguilar MRN: [id number] Date of Birth: 1966/07/15  Today's Date: 06/02/2023 PT Individual Time: 0922 - 1007 PT Individual Time Calculation (min): 60 min  PT Individual Time: 1305-1405 PT Individual Time Calculation (min): 60 min   Short Term Goals: Week 1:  PT Short Term Goal 1 (Week 1): STG = LTG d/t ELOS  SESSION 1 Skilled Therapeutic Interventions/Progress Updates: Patient sitting in recliner on entrance to room. Patient alert and agreeable to PT session.   Patient reported no pain at beginning of PT session  Therapeutic Activity: Transfers: Pt performed sit<>stand transfers throughout session with close supervision for safety.   Gait Training:  Pt ambulated throughout session using no AD with CGA. Pt demonstrated the following gait deviations with therapist providing the described cuing and facilitation for improvement:  - decreased stance on R LE leading to decreased step length on L LE - decreased reciprocal arm swing - downward gaze with VC to look forward ahead (pt reported increase in dizziness when not looking at floor - reported downward gaze was something done prior to admission but without dizziness). - Waddle-like gait noted   Pt ambulated back to room from main gym with close supervision/CGA with VC to maintain increased step length on L LE, and forward gaze instead of downward.  Neuromuscular Re-ed: NMR facilitated during session with focus on increasing proprioceptive feedback on R LE. - Pt with 5lb ankle weight on non-paretic side (L) in order to increase step length on L, and increase stance time on R. Pt with CGA/close supervision throughout and mod cues to maintain increased stepping length on L. Pt understood rationale for normal gait carryover.  - agility ladder with pt cued to reciprocally step. Pt with CGA for safety and downward gaze to assist with muscle memory. Pt performed multiple laps, then  cued to look forward ahead, and to only look down to adjust if needed. Pt with muscle memory formed on L LE per presentation of stepping on little to no brackets. R LE noted to tap on most of the brackets. Pt performed 2 rounds of that, occasionally looking down to adjust, but maintained standing balance throughout.   NMR performed for improvements in motor control and coordination, balance, sequencing, judgement, and self confidence/ efficacy in performing all aspects of mobility at highest level of independence.   Patient in recliner at end of session with brakes locked, chair alarm set, and all needs within reach.  SESSION 2 Skilled Therapeutic Interventions/Progress Updates: Patient sitting in recliner on entrance to room. Patient alert and agreeable to PT session.   Patient reported no pain at beginning of PT session.   Therapeutic Activity: Transfers: Pt performed sit<>stand transfers throughout session with no AD and supervision for safety. No VC required.   Gait Training:  Pt ambulated throughout session using no AD with CGA/close supervision for safety. Pt with noted increase with step clearance on L LE (pt performed length increase without VC required initially). Pt still with waddle-like gait and decreased reciprocal arms swing. Pt with decreased anterior hip rotation during swing phase. Pt ambulated throughout main gym with VC to increase hip rotation at appropriate times (exaggerated rotation) with CGA for safety. Pt later stated this was how pt walked prior to stroke.   Pt ambulated at the end of the session from main gym<N tower<room with supervision throughout and no rest breaks required (pt stating to have been able to ambulate further). Pt with decreased  awareness to R side by bumping into objects on wall or R side of hallway (pt stating difficult to pay attention sometimes when talking - PTA talking with pt throughout out to challenge awareness to surroundings). Pt with no LOB,  only mild unsteadiness but able to maintain standing position.   Stair Navigation:  Pt ambulated 4 (6") steps in main gym and 11 steps both ascending and descending in stairway in front of main gym with CGA throughout. Pt used R HR per household environment. Pt with reciprocal gait pattern throughout and no SOB or LOB noted.   Neuromuscular Re-ed: NMR facilitated during session with focus on dynamic standing balance while visually tracking hallway that also is periodically busy with people. - Pt ambulated throughout main hallway with verbal instructions to locate colored dots that are laid out at various heights on either side of hallway. Pt with CGA/supervision throughout, and given VC to maintain moving while visually scanning for targets and picking them up. Pt with slight noted increase in unsteadiness due to being unable to look down at ground as pt's previous report of increase in dizziness when not looking at the ground while walking. Pt able to find all targets with no true LOB.  NMR performed for improvements in motor control and coordination, balance, sequencing, judgement, and self confidence/ efficacy in performing all aspects of mobility at highest level of independence.   Patient sitting in recliner at end of session with brakes locked, chair alarm set, and all needs within reach.    Therapy Documentation Precautions:  Precautions Precautions: Fall Precaution Comments: mild R hemipareisis Restrictions Weight Bearing Restrictions: No  Therapy/Group: Individual Therapy  Zailynn Brandel PTA 06/02/2023, 3:43 PM

## 2023-06-02 NOTE — Progress Notes (Signed)
Patient ID: Rachael Aguilar, female   DOB: November 07, 1965, 57 y.o.   MRN: [id number] Met with the patient to review current situation, rehab schedule, team conference and plan of care. Patient noted she was told she could be discharged Wednesday; team conference follow up. Noted hx of Bipolar /Depression with anxiety; requested prn.  Also reviewed secondary risks including HTN, prediabetes (A1C6.6) with medications and dietary modification recommendations. Continue to follow along to address educational needs to facilitate preparation for discharge. Pamelia Hoit

## 2023-06-02 NOTE — Plan of Care (Signed)
  Problem: RH Balance Goal: LTG Patient will maintain dynamic standing balance (PT) Description: LTG:  Patient will maintain dynamic standing balance with assistance during mobility activities (PT) Flowsheets (Taken 06/01/2023 1722) LTG: Pt will maintain dynamic standing balance during mobility activities with:: Independent   Problem: Sit to Stand Goal: LTG:  Patient will perform sit to stand with assistance level (PT) Description: LTG:  Patient will perform sit to stand with assistance level (PT) Flowsheets (Taken 06/01/2023 1722) LTG: PT will perform sit to stand in preparation for functional mobility with assistance level: Independent   Problem: RH Bed to Chair Transfers Goal: LTG Patient will perform bed/chair transfers w/assist (PT) Description: LTG: Patient will perform bed to chair transfers with assistance (PT). Flowsheets (Taken 06/01/2023 1722) LTG: Pt will perform Bed to Chair Transfers with assistance level: Independent   Problem: RH Car Transfers Goal: LTG Patient will perform car transfers with assist (PT) Description: LTG: Patient will perform car transfers with assistance (PT). Flowsheets (Taken 06/01/2023 1722) LTG: Pt will perform car transfers with assist:: Supervision/Verbal cueing   Problem: RH Furniture Transfers Goal: LTG Patient will perform furniture transfers w/assist (OT/PT) Description: LTG: Patient will perform furniture transfers  with assistance (OT/PT). Flowsheets (Taken 06/01/2023 1722) LTG: Pt will perform furniture transfers with assist:: Independent   Problem: RH Floor Transfers Goal: LTG Patient will perform floor transfers w/assist (PT) Description: LTG: Patient will perform floor transfers with assistance (PT). Flowsheets (Taken 06/01/2023 1722) LTG: PT WILL PERFORM FLOOR TRANFERS  WITH  ASSIST:: Set up assist    Problem: RH Ambulation Goal: LTG Patient will ambulate in home environment (PT) Description: LTG: Patient will ambulate in home  environment, # of feet with assistance (PT). Flowsheets (Taken 06/01/2023 1722) LTG: Pt will ambulate in home environ  assist needed:: Independent LTG: Ambulation distance in home environment: up to 50 ft per bout Goal: LTG Patient will ambulate in community environment (PT) Description: LTG: Patient will ambulate in community environment, # of feet with assistance (PT). Flowsheets (Taken 06/01/2023 1722) LTG: Pt will ambulate in community environ  assist needed:: Supervision/Verbal cueing LTG: Ambulation distance in community environment: >300 ft   Problem: RH Stairs Goal: LTG Patient will ambulate up and down stairs w/assist (PT) Description: LTG: Patient will ambulate up and down # of stairs with assistance (PT) Flowsheets (Taken 06/01/2023 1722) LTG: Pt will ambulate up/down stairs assist needed:: Supervision/Verbal cueing LTG: Pt will  ambulate up and down number of stairs: at least 15 steps with HR setup as per home environment

## 2023-06-02 NOTE — Progress Notes (Signed)
PROGRESS NOTE   Subjective/Complaints:  No issues overnite, report climbing steps with PT  Slept well   ROS- no CP, SOB, N/V/D  Objective:   No results found. Recent Labs    06/01/23 0454  WBC 6.2  HGB 14.9  HCT 44.2  PLT 286   Recent Labs    05/31/23 1413 06/01/23 0454  NA 141 140  K 3.9 3.5  CL 106 107  CO2 25 23  GLUCOSE 117* 127*  BUN 17 18  CREATININE 0.79 0.69  CALCIUM 9.4 9.4    Intake/Output Summary (Last 24 hours) at 06/02/2023 0753 Last data filed at 06/02/2023 0700 Gross per 24 hour  Intake 1013 ml  Output --  Net 1013 ml        Physical Exam: Vital Signs Blood pressure 111/78, pulse 89, temperature 98.2 F (36.8 C), temperature source Oral, resp. rate 18, height 5\' 4"  (1.626 m), weight 77.2 kg, last menstrual period 10/13/2020, SpO2 99%.   General: No acute distress Mood and affect are appropriate Heart: Regular rate and rhythm no rubs murmurs or extra sounds Lungs: Clear to auscultation, breathing unlabored, no rales or wheezes Abdomen: Positive bowel sounds, soft nontender to palpation, nondistended Extremities: No clubbing, cyanosis, or edema Skin: No evidence of breakdown, no evidence of rash Neurologic: Cranial nerves II through XII intact, motor strength is 5/5 inleft and 2- RIght deltoid, bicep, tricep, grip,5/5 left and 3/5 Right  hip flexor, 5/5 left and 4/5 RIght knee extensors, 5/5 left and 3/5 right ankle dorsiflexor and 5/5 l;eft and 4/5 right plantar flexor Sensory exam normal sensation to light touch lower extremities  Musculoskeletal: Full range of motion in all 4 extremities. No joint swelling   Assessment/Plan: 1. Functional deficits which require 3+ hours per day of interdisciplinary therapy in a comprehensive inpatient rehab setting. Physiatrist is providing close team supervision and 24 hour management of active medical problems listed below. Physiatrist and  rehab team continue to assess barriers to discharge/monitor patient progress toward functional and medical goals  Care Tool:  Bathing    Body parts bathed by patient: Right arm, Left arm, Chest, Abdomen, Front perineal area, Buttocks, Right upper leg, Face, Left lower leg, Right lower leg, Left upper leg         Bathing assist Assist Level: Supervision/Verbal cueing     Upper Body Dressing/Undressing Upper body dressing   What is the patient wearing?: Bra, Pull over shirt    Upper body assist Assist Level: Set up assist    Lower Body Dressing/Undressing Lower body dressing      What is the patient wearing?: Underwear/pull up, Pants     Lower body assist Assist for lower body dressing: Supervision/Verbal cueing     Toileting Toileting    Toileting assist Assist for toileting: Supervision/Verbal cueing     Transfers Chair/bed transfer  Transfers assist     Chair/bed transfer assist level: Contact Guard/Touching assist     Locomotion Ambulation   Ambulation assist      Assist level: Contact Guard/Touching assist Assistive device: No Device Max distance: 200 ft   Walk 10 feet activity   Assist     Assist level:  Contact Guard/Touching assist Assistive device: No Device   Walk 50 feet activity   Assist    Assist level: Contact Guard/Touching assist Assistive device: No Device    Walk 150 feet activity   Assist    Assist level: Contact Guard/Touching assist Assistive device: No Device    Walk 10 feet on uneven surface  activity   Assist     Assist level: Contact Guard/Touching assist Assistive device:  (none)   Wheelchair     Assist Is the patient using a wheelchair?: No   Wheelchair activity did not occur: N/A         Wheelchair 50 feet with 2 turns activity    Assist    Wheelchair 50 feet with 2 turns activity did not occur: N/A       Wheelchair 150 feet activity     Assist  Wheelchair 150 feet  activity did not occur: N/A       Blood pressure 111/78, pulse 89, temperature 98.2 F (36.8 C), temperature source Oral, resp. rate 18, height 5\' 4"  (1.626 m), weight 77.2 kg, last menstrual period 10/13/2020, SpO2 99%.  Medical Problem List and Plan: 1. 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  Antithrombotics: -DVT/anticoagulation:  Pharmaceutical: Lovenox             -antiplatelet therapy: DAPT x 3 weeks followed by ASA alone. 3. Pain Management: tylenol prn.  4. Mood/Behavior/Sleep:  LCSW to follow for evaluation and support.             --Melatonin prn for sleep.              -antipsychotic agents: N/A 5. Neuropsych/cognition: This patient is capable of making decisions on his own behalf. 6. Skin/Wound Care: Routine pressure relief measures.  7. Fluids/Electrolytes/Nutrition:  Monitor I/O. Check CMET in am  8. HTN: Monitor BP TID--continue Norvasc and Cozaar (question compliant PTA)            Vitals:   06/01/23 1936 06/02/23 0449  BP: (!) 145/91 111/78  Pulse: 96 89  Resp: 18 18  Temp: 98.2 F (36.8 C) 98.2 F (36.8 C)  SpO2: 96% 99%   Controlled  10/24 10. T2DM: Hgb A1C- 6.6 and new diagonsis. Will monitor BS ac/hs and use SSI for now --change diet to Carb Mod and consult RD for diet education CBG (last 3)  Recent Labs    06/01/23 1624 06/01/23 2109 06/02/23 0616  GLUCAP 143* 140* 128*  Controlled 10/24  11. GERD: Resume PPI 12. Intermittent hypokalemia: Recheck electrolytes in AM. 13.. Bereavement: Lost a friend recently.    LOS: 2 days A FACE TO FACE EVALUATION WAS PERFORMED  Erick Colace 06/02/2023, 7:53 AM

## 2023-06-03 DIAGNOSIS — F39 Unspecified mood [affective] disorder: Secondary | ICD-10-CM

## 2023-06-03 LAB — GLUCOSE, CAPILLARY
Glucose-Capillary: 134 mg/dL — ABNORMAL HIGH (ref 70–99)
Glucose-Capillary: 142 mg/dL — ABNORMAL HIGH (ref 70–99)
Glucose-Capillary: 129 mg/dL — ABNORMAL HIGH (ref 70–99)
Glucose-Capillary: 168 mg/dL — ABNORMAL HIGH (ref 70–99)

## 2023-06-03 NOTE — Progress Notes (Signed)
Occupational Therapy Session Note  Patient Details  Name: Rachael Aguilar MRN: [id number] Date of Birth: 1966/07/23  Today's Date: 06/03/2023 OT Individual Time: 1116-1200 OT Individual Time Calculation (min): 44 min  16 mins missed as pt meeting with neuro psych   Short Term Goals: Week 1:  OT Short Term Goal 1 (Week 1): STGs = LTGs  Skilled Therapeutic Interventions/Progress Updates:  Pt greeted seated EOB needing to urinate, pt agreeable to OT intervention.      Transfers/bed mobility: pt completed ambulatory transfer to bathroom with no AD and light CGA. Pt completed functional ambulation greater than a household distance with no AD and CGA.   Therapeutic activity: utilized BITS to further assess RUE coordination vs LUE and challenge strength in RUE. Pt instructed to reach out of BOS to tap stimulus on screen to assess  RUE- 98%, 1.06 sec reaction time LUE- 96%, .94 sec reaction time  Pt additionally completed various dynamic reaching tasks with RUE weighted down with 2 lb weight to challenge dynamic standing balance as well as increasing RUE strength/endurance.   Pt completed simulated IADL task with pt instructed to reach to floor level to transport cones from floor level to side table to challenge balance and simulate grocery task, pt completed task with supervision.   Pt completed [facility] task with pt instructed to use RUE to balance small discs on top of cones to challenge [facility] and pincer grasp. Graded task up and had pt scoop discs with spatula with RUE and place discs on top of cones to challenge [facility] and motor planning with pt completing task with supervision.   Pt was able to demonstrate stepping over tub with CGA however pt would rather use TTB.   Ended session with pt supine in bed with all needs within reach and bed alarm activated.                    Therapy Documentation Precautions:  Precautions Precautions: Fall Precaution Comments: mild R  hemipareisis Restrictions Weight Bearing Restrictions: No General: General OT Amount of Missed Time: 16 Minutes  Pain: No pain    Therapy/Group: Individual Therapy  [name] 06/03/2023, 12:24 PM

## 2023-06-03 NOTE — Progress Notes (Signed)
Physical Therapy Session Note  Patient Details  Name: Rachael Aguilar MRN: [id number] Date of Birth: 1966-05-22  Today's Date: 06/03/2023 PT Individual Time: 0808-0850 PT Individual Time Calculation (min): 42 min   Short Term Goals: Week 1:  PT Short Term Goal 1 (Week 1): STG = LTG d/t ELOS  Skilled Therapeutic Interventions/Progress Updates:  Patient supine in bed on entrance to room. Very cold and has blankets pulled up over face. Patient alert and agreeable to PT session.   Patient with no pain complaint at start of session.  Therapeutic Activity: Bed Mobility: Pt performed supine > sit with Mod I. Dresses while seated on EOB with supervision/ setup overall. No cues required for technique.  Transfers: Pt performed sit<>stand and stand pivot transfers throughout session with supervision/ Mod I. Provided vc for self assessment upon stance for improved safety awareness of balance.   Gait Training:  Pt ambulated >200 ftx2 reaching main therapy gym without AD and CGA d/t missteps with RLE intermittently. Provided vc/ tc for increased focus to RLE movement.  Neuromuscular Re-ed: NMR facilitated during session with focus on dynamic standing balance, balance during gait, and motor control. Pt guided in ambulation through agility ladder: One step into each square out and back x1 One step in each square with high knees and slower pace out and back x1 Alternating toe tap with progression of step into next square out and back x2 Alternating step with progression into next square after controlled circling of swing LE around cone x2.    Pt demos good balance overall and demos minimal instances of LOB when standing on RLE, Is able to utilize proper stepping strategy to self correct.   Guided in [place] stepping laterally over 20 ft in each direction requiring CGA to hips and pt intermittently grasping to therapist's arm for balance. Requires vc for sequencing.   NMR performed for improvements  in motor control and coordination, balance, sequencing, judgement, and self confidence/ efficacy in performing all aspects of mobility at highest level of independence.   On return to room, pt participates in 5xSTS with time of 28.15 sec.   Patient supine in bed at end of session with brakes locked, bed alarm set, and all needs within reach.  Therapy Documentation Precautions:  Precautions Precautions: Fall Precaution Comments: mild R hemipareisis Restrictions Weight Bearing Restrictions: No  Pain: Pain Assessment Pain Scale: 0-10 Pain Score: 0-No pain related this session.   Therapy/Group: Individual Therapy  Loel Dubonnet PT, DPT, CSRS 06/03/2023, 12:54 PM

## 2023-06-03 NOTE — Progress Notes (Signed)
Patient ID: Rachael Aguilar, female   DOB: [birth year], 57 y.o.   MRN: [id number]  Sw spoke with daughter, Forde Dandy to address any questions or concerns. Daughter following up on status of Medicaid. Daughter will wait for follow up from financial counselor assisting with Medicaid process.

## 2023-06-03 NOTE — IPOC Note (Signed)
Overall Plan of Care [facility]) Patient Details Name: Rachael Aguilar MRN: [id number] DOB: Jan 17, 1966  Admitting Diagnosis: Left pontine stroke [facility])  Hospital Problems: Principal Problem:   Left pontine stroke (HCC)     Functional Problem List: Nursing Safety, Endurance, Medication Management  PT Balance, Endurance, Motor, Safety, Perception  OT Balance, Motor, Cognition  SLP Cognition  TR         Basic ADL's: OT Bathing, Dressing, Toileting, Grooming     Advanced  ADL's: OT Light Housekeeping, Laundry     Transfers: PT Bed Mobility, Bed to Chair, Set designer, [employer], Dietitian, Technical brewer: PT Ambulation, Stairs     Additional Impairments: OT Fuctional Use of Upper Extremity  SLP Social Cognition   Problem Solving, Memory, Awareness  TR      Anticipated Outcomes Item Anticipated Outcome  Self Feeding independent  Swallowing      Basic self-care  independent with dressing, mod I bathing  Toileting  independent   Bathroom Transfers independent to toilet, mod I to tub  Bowel/Bladder  n/a  Transfers  Mod I/ IND  Locomotion  IND/ SUP  Communication     Cognition  supervision A  Pain  n/a  Safety/Judgment  manage w cues   Therapy Plan: PT Intensity: Minimum of 1-2 x/day ,45 to 90 minutes PT Frequency: 5 out of 7 days PT Duration Estimated Length of Stay: 7-10 days OT Intensity: Minimum of 1-2 x/day, 45 to 90 minutes OT Frequency: 5 out of 7 days, Total of 15 hours over 7 days of combined therapies OT Duration/Estimated Length of Stay: 7-8 days SLP Intensity: Minumum of 1-2 x/day, 30 to 90 minutes SLP Frequency: 1 to 3 out of 7 days SLP Duration/Estimated Length of Stay: 7 days   Team Interventions: Nursing Interventions Patient/Family Education, Medication Management, Discharge Planning, Disease Management/Prevention  PT interventions Ambulation/gait training, DME/adaptive equipment instruction, Neuromuscular re-education,  Psychosocial support, Stair training, Community reintegration, UE/LE Strength taining/ROM, Warden/ranger, Discharge planning, Pain management, Therapeutic Activities, UE/LE Coordination activities, Cognitive remediation/compensation, Disease management/prevention, Functional mobility training, Patient/family education, Splinting/orthotics, Therapeutic Exercise, Visual/perceptual remediation/compensation  OT Interventions Balance/vestibular training, Cognitive remediation/compensation, DME/adaptive equipment instruction, Discharge planning, Functional mobility training, Neuromuscular re-education, Patient/family education, Psychosocial support, Self Care/advanced ADL retraining, Therapeutic Exercise, Therapeutic Activities, UE/LE Strength taining/ROM, UE/LE Coordination activities  SLP Interventions Cognitive remediation/compensation, Internal/external aids, Cueing hierarchy, Environmental controls, Therapeutic Activities, Functional tasks, Patient/family education, Therapeutic Exercise  TR Interventions    SW/CM Interventions Discharge Planning, Psychosocial Support, Patient/Family Education, Disease Management/Prevention   Barriers to Discharge MD  New diabetic  Nursing Home environment access/layout 1 level 4 ste no rail w family or dtr's home 1 level 10 ste right rail  PT Inaccessible home environment, New diabetic, Lack of/limited family support, Community education officer for SNF coverage    OT      SLP      SW       Team Discharge Planning: Destination: PT-Home ,OT- Home , SLP-Home Projected Follow-up: PT-Outpatient PT, 24 hour supervision/assistance, OT-  Home health OT, SLP-Outpatient SLP Projected Equipment Needs: PT-To be determined, OT- Tub/shower bench, SLP-None recommended by SLP Equipment Details: PT- , OT-  Patient/family involved in discharge planning: PT- Patient,  OT-Patient, SLP-Patient  MD ELOS: 7d Medical Rehab Prognosis:  Excellent Assessment: The patient has been  admitted for CIR therapies with the diagnosis of left pontine stroke . The team will be addressing functional mobility, strength, stamina, balance, safety, adaptive techniques and equipment, self-care, bowel  and bladder mgt, patient and caregiver education, education for newly diagnosed DM2 and HTN. Goals have been set at mod I. Anticipated discharge destination is Home .        See Team Conference Notes for weekly updates to the plan of care

## 2023-06-03 NOTE — Progress Notes (Signed)
Patient ID: Rachael Aguilar, female   DOB: September 30, 1965, 57 y.o.   MRN: [id number]  TTB ordered through [employer]. Patient uninsured please provide HEP.

## 2023-06-03 NOTE — Progress Notes (Signed)
PROGRESS NOTE   Subjective/Complaints:  Cough but no other c/o denies smoking   ROS- no CP, SOB, N/V/D  Objective:   No results found. Recent Labs    06/01/23 0454  WBC 6.2  HGB 14.9  HCT 44.2  PLT 286   Recent Labs    05/31/23 1413 06/01/23 0454  NA 141 140  K 3.9 3.5  CL 106 107  CO2 25 23  GLUCOSE 117* 127*  BUN 17 18  CREATININE 0.79 0.69  CALCIUM 9.4 9.4    Intake/Output Summary (Last 24 hours) at 06/03/2023 0749 Last data filed at 06/02/2023 1800 Gross per 24 hour  Intake 360 ml  Output --  Net 360 ml        Physical Exam: Vital Signs Blood pressure 112/73, pulse 84, temperature 97.8 F (36.6 C), temperature source Oral, resp. rate 18, height 5\' 4"  (1.626 m), weight 77.2 kg, last menstrual period 10/13/2020, SpO2 97%.   General: No acute distress Mood and affect are appropriate Heart: Regular rate and rhythm no rubs murmurs or extra sounds Lungs: Clear to auscultation, breathing unlabored, no rales or wheezes Abdomen: Positive bowel sounds, soft nontender to palpation, nondistended Extremities: No clubbing, cyanosis, or edema Skin: No evidence of breakdown, no evidence of rash Neurologic: Cranial nerves II through XII intact, motor strength is 5/5 inleft and 4- RIght deltoid, bicep, tricep, grip,5/5 left and 4-/5 Right  hip flexor, 5/5 left and 4/5 RIght knee extensors, 5/5 left and 4-/5 right ankle dorsiflexor and 5/5 l;eft and 4/5 right plantar flexor Sensory exam normal sensation to light touch lower extremities  Musculoskeletal: Full range of motion in all 4 extremities. No joint swelling   Assessment/Plan: 1. Functional deficits which require 3+ hours per day of interdisciplinary therapy in a comprehensive inpatient rehab setting. Physiatrist is providing close team supervision and 24 hour management of active medical problems listed below. Physiatrist and rehab team continue to assess  barriers to discharge/monitor patient progress toward functional and medical goals  Care Tool:  Bathing    Body parts bathed by patient: Right arm, Left arm, Chest, Abdomen, Front perineal area, Buttocks, Right upper leg, Face, Left lower leg, Right lower leg, Left upper leg         Bathing assist Assist Level: Supervision/Verbal cueing     Upper Body Dressing/Undressing Upper body dressing   What is the patient wearing?: Bra, Pull over shirt    Upper body assist Assist Level: Set up assist    Lower Body Dressing/Undressing Lower body dressing      What is the patient wearing?: Underwear/pull up, Pants     Lower body assist Assist for lower body dressing: Supervision/Verbal cueing     Toileting Toileting    Toileting assist Assist for toileting: Supervision/Verbal cueing     Transfers Chair/bed transfer  Transfers assist     Chair/bed transfer assist level: Contact Guard/Touching assist     Locomotion Ambulation   Ambulation assist      Assist level: Contact Guard/Touching assist Assistive device: No Device Max distance: 200 ft   Walk 10 feet activity   Assist     Assist level: Contact Guard/Touching assist Assistive  device: No Device   Walk 50 feet activity   Assist    Assist level: Contact Guard/Touching assist Assistive device: No Device    Walk 150 feet activity   Assist    Assist level: Contact Guard/Touching assist Assistive device: No Device    Walk 10 feet on uneven surface  activity   Assist     Assist level: Contact Guard/Touching assist Assistive device:  (none)   Wheelchair     Assist Is the patient using a wheelchair?: No   Wheelchair activity did not occur: N/A         Wheelchair 50 feet with 2 turns activity    Assist    Wheelchair 50 feet with 2 turns activity did not occur: N/A       Wheelchair 150 feet activity     Assist  Wheelchair 150 feet activity did not occur: N/A        Blood pressure 112/73, pulse 84, temperature 97.8 F (36.6 C), temperature source Oral, resp. rate 18, height 5\' 4"  (1.626 m), weight 77.2 kg, last menstrual period 10/13/2020, SpO2 97%.  Medical Problem List and Plan: 1. 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  Antithrombotics: -DVT/anticoagulation:  Pharmaceutical: Lovenox             -antiplatelet therapy: DAPT x 3 weeks followed by ASA alone. 3. Pain Management: tylenol prn.  4. Mood/Behavior/Sleep:  LCSW to follow for evaluation and support.             --Melatonin prn for sleep.              -antipsychotic agents: N/A 5. Neuropsych/cognition: This patient is capable of making decisions on his own behalf. 6. Skin/Wound Care: Routine pressure relief measures.  7. Fluids/Electrolytes/Nutrition:  Monitor I/O. Check CMET in am  8. HTN: Monitor BP TID--continue Norvasc and Cozaar (question compliant PTA)            Vitals:   06/02/23 1930 06/03/23 0440  BP: (!) 146/93 112/73  Pulse: (!) 105 84  Resp: 18 18  Temp: 98.2 F (36.8 C) 97.8 F (36.6 C)  SpO2: 99% 97%   Controlled  10/25 10. T2DM: Hgb A1C- 6.6 and new diagonsis. Will monitor BS ac/hs and use SSI for now --change diet to Carb Mod and consult RD for diet education CBG (last 3)  Recent Labs    06/02/23 1626 06/02/23 2049 06/03/23 0625  GLUCAP 117* 212* 134*  Controlled 10/25  11. GERD: Resume PPI 12. Intermittent hypokalemia:  improved , recheck Monday     Latest Ref Rng & Units 06/01/2023    4:54 AM 05/31/2023    2:13 PM 05/29/2023    5:07 AM  BMP  Glucose 70 - 99 mg/dL 161  096  045   BUN 6 - 20 mg/dL 18  17  9    Creatinine 0.44 - 1.00 mg/dL 4.09  8.11  9.14   Sodium 135 - 145 mmol/L 140  141  139   Potassium 3.5 - 5.1 mmol/L 3.5  3.9  3.0   Chloride 98 - 111 mmol/L 107  106  103   CO2 22 - 32 mmol/L 23  25  26     Calcium 8.9 - 10.3 mg/dL 9.4  9.4  8.8    . 13.. Bereavement: Lost a friend recently.    LOS: 3 days A FACE TO FACE EVALUATION WAS PERFORMED  Erick Colace 06/03/2023, 7:49 AM

## 2023-06-03 NOTE — Progress Notes (Signed)
Patient ID: Rachael Aguilar, female   DOB: [birth year], 57 y.o.   MRN: [id number]  Sw met with patient to discuss primary SW will be OOO 10/28 and 10/29, returning on 10/30. Patient d/c set for 10/29.

## 2023-06-03 NOTE — Progress Notes (Signed)
Speech Language Pathology Daily Session Note  Patient Details  Name: Rachael Aguilar MRN: [id number] Date of Birth: Mar 26, 1966  Today's Date: 06/03/2023 SLP Individual Time: 7425-9563 SLP Individual Time Calculation (min): 43 min  Short Term Goals: Week 1: SLP Short Term Goal 1 (Week 1): STG=LTG due to ELOS  Skilled Therapeutic Interventions: Skilled therapy session focused on cognitive goals. SLP facilitated session by providing supervision A during financial management activity requiring mildly complex addition/subtraction. Patient occasionally required cues to review calculations, however was then able to self correct. Patient able to recall memory strategies discussed prior sessions independently. SLP discussed ST goals with daughter and reviewed speech strategies. Patient 95% intelligible this date with modiA. Patient left in chair with alarm set and call bell in reach. Continue POC.   Pain No pain reported   Therapy/Group: Individual Therapy  Sayf Kerner M.A., CF-SLP 06/03/2023, 3:40 PM

## 2023-06-03 NOTE — Progress Notes (Signed)
Patient ID: Rachael Aguilar, female   DOB: 04-Jun-1966, 57 y.o.   MRN: [id number]  Patient d/c updated to 10/29.

## 2023-06-04 LAB — GLUCOSE, CAPILLARY
Glucose-Capillary: 142 mg/dL — ABNORMAL HIGH (ref 70–99)
Glucose-Capillary: 165 mg/dL — ABNORMAL HIGH (ref 70–99)
Glucose-Capillary: 146 mg/dL — ABNORMAL HIGH (ref 70–99)
Glucose-Capillary: 207 mg/dL — ABNORMAL HIGH (ref 70–99)

## 2023-06-04 MED ORDER — MAGNESIUM GLUCONATE 500 MG PO TABS
250.0000 mg | ORAL_TABLET | Freq: Every day | ORAL | Status: DC
Start: 2023-06-04 — End: 2023-06-06
  Administered 2023-06-04 – 2023-06-05 (×2): 250 mg via ORAL
  Filled 2023-06-04 (×2): qty 1

## 2023-06-04 NOTE — Progress Notes (Signed)
Speech Language Pathology Daily Session Note  Patient Details  Name: SHANDON HODA MRN: [id number] Date of Birth: Nov 15, 1965  Today's Date: 06/04/2023 SLP Individual Time: 1015-1100 SLP Individual Time Calculation (min): 45 min  Short Term Goals: Week 1: SLP Short Term Goal 1 (Week 1): STG=LTG due to ELOS  Skilled Therapeutic Interventions: Skilled intervention focused on cognition. Pt completed moderately complex verbal problem solving task by determining a single word that would complete 3 words to make a compound word with min A. She completed moderately complex time calculations with mod A. Cont therapy per plan of care.      Pain Pain Assessment Pain Scale: Faces Pain Score: 0-No pain Faces Pain Scale: No hurt  Therapy/Group: Individual Therapy  Amil Amen A Konni Kesinger 06/04/2023, 11:02 AM

## 2023-06-04 NOTE — Progress Notes (Signed)
Occupational Therapy Session Note  Patient Details  Name: Rachael Aguilar MRN: [id number] Date of Birth: [birth year]  Today's Date: 06/04/2023 OT Individual Time: 1122-1200 OT Individual Time Calculation (min): 38 min    Short Term Goals: Week 1:  OT Short Term Goal 1 (Week 1): STGs = LTGs  Skilled Therapeutic Interventions/Progress Updates:  Pt greeted supine in bed, pt agreeable to OT intervention.      Transfers/bed mobility: pt completed all functional ambulation greater than a household distance with no AD and distant supervision.   Therapeutic activity: pt completed standing tolerance task with pt instructed to duplicate pipe tree structure from visual aid provided with 4lb weighted applied to RUE to increase strength and motor planning. Pt completed task with supervision. Able to stand for 3 mins and 45 secs   Exercises: pt completed below therex with level 3 theraband:   X10 shoulder flexion  X10 bicep curls X10 shoulder horizontal ABD X10 shoulder diagonal pulls X10 shoulder extension  X10 alternating punches X10 bilateral shoulder external rotation   Issued pt written HEP to increase carryover   Education provided handou ton [facility] tasks for home   X10 chest presses with 4lb weight X10 shoulder flexion to 90* with 4 lb weight  Ended session with pt supine in bed with all needs within reach, pt independent in the room.                Therapy Documentation Precautions:  Precautions Precautions: Fall Precaution Comments: mild R hemipareisis Restrictions Weight Bearing Restrictions: No General: General OT Amount of Missed Time: 7 Minutes  Pain: No pain    Therapy/Group: Individual Therapy  [name] 06/04/2023, 12:27 PM

## 2023-06-04 NOTE — Progress Notes (Signed)
Physical Therapy Session Note  Patient Details  Name: PEYTIN EWTON MRN: [id number] Date of Birth: 1965-11-23  {CHL IP REHAB PT TIME CALCULATION:304800500}  Short Term Goals: {NWG:9562130}  Skilled Therapeutic Interventions/Progress Updates:      Therapy Documentation Precautions:  Precautions Precautions: Fall Precaution Comments: mild R hemipareisis Restrictions Weight Bearing Restrictions: No General:   Vital Signs: Therapy Vitals Temp: 97.9 F (36.6 C) Pulse Rate: 73 Resp: 18 BP: (!) 134/95 Patient Position (if appropriate): Lying Oxygen Therapy SpO2: 99 % O2 Device: Room Air Pain:     Therapy/Group: Individual Therapy  Loel Dubonnet 06/04/2023, 8:24 AM

## 2023-06-04 NOTE — Progress Notes (Signed)
Physical Therapy Session Note  Patient Details  Name: Rachael Aguilar MRN: [id number] Date of Birth: [birth year]  {CHL IP REHAB PT TIME CALCULATION:304800500}  Short Term Goals: Week 1:  PT Short Term Goal 1 (Week 1): STG = LTG d/t ELOS  Skilled Therapeutic Interventions/Progress Updates:      Therapy Documentation Precautions:  Precautions Precautions: Fall Precaution Comments: mild R hemipareisis Restrictions Weight Bearing Restrictions: No  Pain:  No pain related this session.   Therapy/Group: Individual Therapy  Loel Dubonnet 06/03/2023, 12:23 PM

## 2023-06-04 NOTE — Progress Notes (Signed)
PROGRESS NOTE   Subjective/Complaints: Seen in therapy gym Has no new questions or concerns Tolerated therapy well today Denies pain  ROS- no CP, SOB, N/V/D, pain  Objective:   No results found. No results for input(s): "WBC", "HGB", "HCT", "PLT" in the last 72 hours.  No results for input(s): "NA", "K", "CL", "CO2", "GLUCOSE", "BUN", "CREATININE", "CALCIUM" in the last 72 hours.   Intake/Output Summary (Last 24 hours) at 06/04/2023 1522 Last data filed at 06/04/2023 1259 Gross per 24 hour  Intake 240 ml  Output --  Net 240 ml        Physical Exam: Vital Signs Blood pressure 112/85, pulse 90, temperature 98 F (36.7 C), temperature source Oral, resp. rate 18, height 5\' 4"  (1.626 m), weight 77.2 kg, last menstrual period 10/13/2020, SpO2 97%.   General: No acute distress Mood and affect are appropriate Heart: Regular rate and rhythm no rubs murmurs or extra sounds Lungs: Clear to auscultation, breathing unlabored, no rales or wheezes Abdomen: Positive bowel sounds, soft nontender to palpation, nondistended Extremities: No clubbing, cyanosis, or edema Skin: No evidence of breakdown, no evidence of rash Neurologic: Cranial nerves II through XII intact, motor strength is 5/5 inleft and 4- RIght deltoid, bicep, tricep, grip,5/5 left and 4-/5 Right  hip flexor, 5/5 left and 4/5 RIght knee extensors, 5/5 left and 4-/5 right ankle dorsiflexor and 5/5 l;eft and 4/5 right plantar flexor, stable 10/26 Sensory exam normal sensation to light touch lower extremities  Musculoskeletal: Full range of motion in all 4 extremities. No joint swelling   Assessment/Plan: 1. Functional deficits which require 3+ hours per day of interdisciplinary therapy in a comprehensive inpatient rehab setting. Physiatrist is providing close team supervision and 24 hour management of active medical problems listed below. Physiatrist and rehab team  continue to assess barriers to discharge/monitor patient progress toward functional and medical goals  Care Tool:  Bathing    Body parts bathed by patient: Right arm, Left arm, Chest, Abdomen, Front perineal area, Buttocks, Right upper leg, Face, Left lower leg, Right lower leg, Left upper leg         Bathing assist Assist Level: Supervision/Verbal cueing     Upper Body Dressing/Undressing Upper body dressing   What is the patient wearing?: Bra, Pull over shirt    Upper body assist Assist Level: Set up assist    Lower Body Dressing/Undressing Lower body dressing      What is the patient wearing?: Underwear/pull up, Pants     Lower body assist Assist for lower body dressing: Supervision/Verbal cueing     Toileting Toileting    Toileting assist Assist for toileting: Supervision/Verbal cueing     Transfers Chair/bed transfer  Transfers assist     Chair/bed transfer assist level: Contact Guard/Touching assist     Locomotion Ambulation   Ambulation assist      Assist level: Contact Guard/Touching assist Assistive device: No Device Max distance: 200 ft   Walk 10 feet activity   Assist     Assist level: Contact Guard/Touching assist Assistive device: No Device   Walk 50 feet activity   Assist    Assist level: Contact Guard/Touching assist Assistive device:  No Device    Walk 150 feet activity   Assist    Assist level: Contact Guard/Touching assist Assistive device: No Device    Walk 10 feet on uneven surface  activity   Assist     Assist level: Contact Guard/Touching assist Assistive device:  (none)   Wheelchair     Assist Is the patient using a wheelchair?: No             Wheelchair 50 feet with 2 turns activity    Assist            Wheelchair 150 feet activity     Assist          Blood pressure 112/85, pulse 90, temperature 98 F (36.7 C), temperature source Oral, resp. rate 18, height 5\' 4"   (1.626 m), weight 77.2 kg, last menstrual period 10/13/2020, SpO2 97%.  Medical Problem List and Plan: 1. 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  Antithrombotics: -DVT/anticoagulation:  Pharmaceutical: Lovenox             -antiplatelet therapy: DAPT x 3 weeks followed by ASA alone. 3. Pain Management: tylenol prn.  4. Mood/Behavior/Sleep:  LCSW to follow for evaluation and support.             --Melatonin prn for sleep.              -antipsychotic agents: N/A 5. Neuropsych/cognition: This patient is capable of making decisions on his own behalf. 6. Skin/Wound Care: Routine pressure relief measures.  7. Fluids/Electrolytes/Nutrition:  Monitor I/O. Check CMET in am  8. HTN: Monitor BP TID--continue Norvasc and Cozaar (question compliant PTA)            Vitals:   06/04/23 0604 06/04/23 1307  BP: (!) 134/95 112/85  Pulse: 73 90  Resp: 18 18  Temp: 97.9 F (36.6 C) 98 F (36.7 C)  SpO2: 99% 97%  BP elevated to 134/95, will add magnesium gluconate at night  10. T2DM: Hgb A1C- 6.6 and new diagonsis. Will monitor BS ac/hs and use SSI for now --change diet to Carb Mod and consult RD for diet education CBG (last 3)  Recent Labs    06/03/23 2118 06/04/23 0606 06/04/23 1108  GLUCAP 168* 142* 165*  142-168 on 10/26: will place nursing order for no drinks other than water, tea, and coffee.   11. GERD: Continue PPI 12. Intermittent hypokalemia:  improved , recheck Monday     Latest Ref Rng & Units 06/01/2023    4:54 AM 05/31/2023    2:13 PM 05/29/2023    5:07 AM  BMP  Glucose 70 - 99 mg/dL 161  096  045   BUN 6 - 20 mg/dL 18  17  9    Creatinine 0.44 - 1.00 mg/dL 4.09  8.11  9.14   Sodium 135 - 145 mmol/L 140  141  139   Potassium 3.5 - 5.1 mmol/L 3.5  3.9  3.0   Chloride 98 - 111 mmol/L 107  106  103   CO2 22 - 32 mmol/L 23  25  26     Calcium 8.9 - 10.3 mg/dL 9.4  9.4  8.8    . 13.. Bereavement: Lost a friend recently.  LOS: 4 days A FACE TO FACE EVALUATION WAS PERFORMED  Drema Pry Julianne Chamberlin 06/04/2023, 3:22 PM

## 2023-06-04 NOTE — Progress Notes (Signed)
Speech Language Pathology Daily Session Note  Patient Details  Name: FRANCHESKA JEFFUS MRN: [id number] Date of Birth: 02-Dec-1965  Today's Date: 06/04/2023 SLP Individual Time: 1345-1430 SLP Individual Time Calculation (min): 45 min  Short Term Goals: Week 1: SLP Short Term Goal 1 (Week 1): STG=LTG due to ELOS  Skilled Therapeutic Interventions: Skilled slp intervention focused on cognition and speech intelligibility. Pt listed her strengths weaknesses and responded to interview questions with clear speech with min to no cueing required. Pt continues to require cues for speech strategies with less structured tasks and conversation. Pt would like to go back to  work soon. Educated patient on giving her body time to recover and possibility to interview at a later time and return to work. She participated well in skilled slp intervention. Cont therapy per plan of care.      Pain Pain Assessment Pain Scale: Faces Faces Pain Scale: No hurt  Therapy/Group: Individual Therapy  Carlean Jews Mikhaila Roh 06/04/2023, 2:32 PM

## 2023-06-05 LAB — GLUCOSE, CAPILLARY
Glucose-Capillary: 124 mg/dL — ABNORMAL HIGH (ref 70–99)
Glucose-Capillary: 118 mg/dL — ABNORMAL HIGH (ref 70–99)
Glucose-Capillary: 130 mg/dL — ABNORMAL HIGH (ref 70–99)
Glucose-Capillary: 128 mg/dL — ABNORMAL HIGH (ref 70–99)

## 2023-06-05 MED ORDER — METFORMIN HCL 500 MG PO TABS
500.0000 mg | ORAL_TABLET | Freq: Every day | ORAL | Status: DC
  Administered 2023-06-05 – 2023-06-06 (×2): 500 mg via ORAL
  Filled 2023-06-05 (×2): qty 1

## 2023-06-05 NOTE — Progress Notes (Signed)
PROGRESS NOTE   Subjective/Complaints: No new complaints this morning Discussed elevated blood sugars above 200 and starting metformin and she is agreeable  ROS- denies CP, SOB, N/V/D, pain  Objective:   No results found. No results for input(s): "WBC", "HGB", "HCT", "PLT" in the last 72 hours.  No results for input(s): "NA", "K", "CL", "CO2", "GLUCOSE", "BUN", "CREATININE", "CALCIUM" in the last 72 hours.   Intake/Output Summary (Last 24 hours) at 06/05/2023 0846 Last data filed at 06/05/2023 0830 Gross per 24 hour  Intake 600 ml  Output --  Net 600 ml        Physical Exam: Vital Signs Blood pressure 107/72, pulse 77, temperature 97.7 F (36.5 C), resp. rate 17, height 5\' 4"  (1.626 m), weight 77.2 kg, last menstrual period 10/13/2020, SpO2 100%.   General: No acute distress Mood and affect are appropriate Heart: Regular rate and rhythm no rubs murmurs or extra sounds Lungs: Clear to auscultation, breathing unlabored, no rales or wheezes Abdomen: Positive bowel sounds, soft nontender to palpation, nondistended Extremities: No clubbing, cyanosis, or edema Skin: No evidence of breakdown, no evidence of rash Neurologic: Cranial nerves II through XII intact, motor strength is 5/5 inleft and 4- RIght deltoid, bicep, tricep, grip,5/5 left and 4-/5 Right  hip flexor, 5/5 left and 4/5 RIght knee extensors, 5/5 left and 4-/5 right ankle dorsiflexor and 5/5 l;eft and 4/5 right plantar flexor, stable 10/27 Sensory exam normal sensation to light touch lower extremities  Musculoskeletal: Full range of motion in all 4 extremities. No joint swelling   Assessment/Plan: 1. Functional deficits which require 3+ hours per day of interdisciplinary therapy in a comprehensive inpatient rehab setting. Physiatrist is providing close team supervision and 24 hour management of active medical problems listed below. Physiatrist and rehab  team continue to assess barriers to discharge/monitor patient progress toward functional and medical goals  Care Tool:  Bathing    Body parts bathed by patient: Right arm, Left arm, Chest, Abdomen, Front perineal area, Buttocks, Right upper leg, Face, Left lower leg, Right lower leg, Left upper leg         Bathing assist Assist Level: Supervision/Verbal cueing     Upper Body Dressing/Undressing Upper body dressing   What is the patient wearing?: Bra, Pull over shirt    Upper body assist Assist Level: Set up assist    Lower Body Dressing/Undressing Lower body dressing      What is the patient wearing?: Underwear/pull up, Pants     Lower body assist Assist for lower body dressing: Supervision/Verbal cueing     Toileting Toileting    Toileting assist Assist for toileting: Supervision/Verbal cueing     Transfers Chair/bed transfer  Transfers assist     Chair/bed transfer assist level: Contact Guard/Touching assist     Locomotion Ambulation   Ambulation assist      Assist level: Contact Guard/Touching assist Assistive device: No Device Max distance: 200 ft   Walk 10 feet activity   Assist     Assist level: Contact Guard/Touching assist Assistive device: No Device   Walk 50 feet activity   Assist    Assist level: Contact Guard/Touching assist Assistive device: No  Device    Walk 150 feet activity   Assist    Assist level: Contact Guard/Touching assist Assistive device: No Device    Walk 10 feet on uneven surface  activity   Assist     Assist level: Contact Guard/Touching assist Assistive device:  (none)   Wheelchair     Assist Is the patient using a wheelchair?: No             Wheelchair 50 feet with 2 turns activity    Assist            Wheelchair 150 feet activity     Assist          Blood pressure 107/72, pulse 77, temperature 97.7 F (36.5 C), resp. rate 17, height 5\' 4"  (1.626 m), weight  77.2 kg, last menstrual period 10/13/2020, SpO2 100%.  Medical Problem List and Plan: 1. 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  Antithrombotics: -DVT/anticoagulation:  Pharmaceutical: Lovenox             -antiplatelet therapy: DAPT x 3 weeks followed by ASA alone. 3. Pain Management: tylenol prn.  4. Mood/Behavior/Sleep:  LCSW to follow for evaluation and support.             --continue Melatonin prn for sleep.              -antipsychotic agents: N/A 5. Neuropsych/cognition: This patient is capable of making decisions on his own behalf. 6. Skin/Wound Care: Routine pressure relief measures.  7. Fluids/Electrolytes/Nutrition:  Monitor I/O. Check CMET in am  8. HTN: Monitor BP TID--continue Norvasc and Cozaar (question compliant PTA)            Vitals:   06/04/23 1957 06/05/23 0533  BP: 138/89 107/72  Pulse: 84 77  Resp: 17 17  Temp: 98.1 F (36.7 C) 97.7 F (36.5 C)  SpO2: 100% 100%  BP elevated to 134/95, will add magnesium gluconate at night  10. T2DM: Hgb A1C- 6.6 and new diagonsis. Will monitor BS ac/hs and use SSI for now --change diet to Carb Mod and consult RD for diet education CBG (last 3)  Recent Labs    06/04/23 1637 06/04/23 2048 06/05/23 0603  GLUCAP 146* 207* 124*  142-168 on 10/26: will place nursing order for no drinks other than water, tea, and coffee.  10/27: CBG up to 207: metformin started  11. GERD: continue PPI 12. Intermittent hypokalemia:  improved , recheck Monday     Latest Ref Rng & Units 06/01/2023    4:54 AM 05/31/2023    2:13 PM 05/29/2023    5:07 AM  BMP  Glucose 70 - 99 mg/dL 161  096  045   BUN 6 - 20 mg/dL 18  17  9    Creatinine 0.44 - 1.00 mg/dL 4.09  8.11  9.14   Sodium 135 - 145 mmol/L 140  141  139   Potassium 3.5 - 5.1 mmol/L 3.5  3.9  3.0   Chloride 98 - 111 mmol/L 107  106  103    CO2 22 - 32 mmol/L 23  25  26    Calcium 8.9 - 10.3 mg/dL 9.4  9.4  8.8    . 13.. Bereavement: Lost a friend recently.  LOS: 5 days A FACE TO FACE EVALUATION WAS PERFORMED  Drema Pry Remona Boom 06/05/2023, 8:46 AM

## 2023-06-06 ENCOUNTER — Other Ambulatory Visit (HOSPITAL_COMMUNITY): Payer: Self-pay

## 2023-06-06 DIAGNOSIS — F39 Unspecified mood [affective] disorder: Secondary | ICD-10-CM

## 2023-06-06 DIAGNOSIS — E1149 Type 2 diabetes mellitus with other diabetic neurological complication: Secondary | ICD-10-CM

## 2023-06-06 LAB — CBC
HCT: 42.9 % (ref 36.0–46.0)
Hemoglobin: 14.2 g/dL (ref 12.0–15.0)
MCH: 30.2 pg (ref 26.0–34.0)
MCHC: 33.1 g/dL (ref 30.0–36.0)
MCV: 91.3 fL (ref 80.0–100.0)
Platelets: 330 10*3/uL (ref 150–400)
RBC: 4.7 MIL/uL (ref 3.87–5.11)
RDW: 13.6 % (ref 11.5–15.5)
WBC: 7.9 10*3/uL (ref 4.0–10.5)
nRBC: 0 % (ref 0.0–0.2)

## 2023-06-06 LAB — GLUCOSE, CAPILLARY
Glucose-Capillary: 133 mg/dL — ABNORMAL HIGH (ref 70–99)
Glucose-Capillary: 118 mg/dL — ABNORMAL HIGH (ref 70–99)

## 2023-06-06 MED ORDER — METFORMIN HCL 500 MG PO TABS
500.0000 mg | ORAL_TABLET | Freq: Every day | ORAL | 0 refills | Status: AC
  Filled 2023-06-06: qty 30, 30d supply, fill #0

## 2023-06-06 MED ORDER — MAGNESIUM OXIDE 400 MG PO TABS
200.0000 mg | ORAL_TABLET | Freq: Every day | ORAL | 0 refills | Status: AC
  Filled 2023-06-06: qty 30, 60d supply, fill #0

## 2023-06-06 MED ORDER — CLOPIDOGREL BISULFATE 75 MG PO TABS
75.0000 mg | ORAL_TABLET | Freq: Every day | ORAL | 0 refills | Status: AC
  Filled 2023-06-06: qty 13, 13d supply, fill #0

## 2023-06-06 MED ORDER — TRAZODONE HCL 50 MG PO TABS
25.0000 mg | ORAL_TABLET | Freq: Every evening | ORAL | 0 refills | Status: AC | PRN
  Filled 2023-06-06: qty 30, 30d supply, fill #0

## 2023-06-06 MED ORDER — ASPIRIN 81 MG PO TBEC
81.0000 mg | DELAYED_RELEASE_TABLET | Freq: Every day | ORAL | 0 refills | Status: AC
  Filled 2023-06-06: qty 30, 30d supply, fill #0

## 2023-06-06 MED ORDER — ATORVASTATIN CALCIUM 40 MG PO TABS
40.0000 mg | ORAL_TABLET | Freq: Every day | ORAL | 0 refills | Status: AC
  Filled 2023-06-06: qty 30, 30d supply, fill #0

## 2023-06-06 MED ORDER — LOSARTAN POTASSIUM 25 MG PO TABS
25.0000 mg | ORAL_TABLET | Freq: Every day | ORAL | 0 refills | Status: AC
  Filled 2023-06-06: qty 30, 30d supply, fill #0

## 2023-06-06 MED ORDER — AMLODIPINE BESYLATE 10 MG PO TABS
10.0000 mg | ORAL_TABLET | Freq: Every day | ORAL | 0 refills | Status: AC
  Filled 2023-06-06: qty 30, 30d supply, fill #0

## 2023-06-06 NOTE — Progress Notes (Signed)
Physical Therapy Discharge Summary  Patient Details  Name: Rachael Aguilar MRN: [id number] Date of Birth: [birth year]  Date of Discharge from PT service:June 06, 2023  {CHL IP REHAB PT TIME CALCULATION:[id number]}   Patient has met {NUMBERS 0-12:18577} of 9 long term goals due to improved activity tolerance, improved balance, increased strength, functional use of  right upper extremity and right lower extremity, improved awareness, and improved coordination.  Patient to discharge at an ambulatory level Supervision for community distances.   Patient's care partner is independent to provide the necessary physical assistance at discharge.  Reasons goals not met: ***  Recommendation:  Patient will benefit from ongoing skilled PT services in outpatient setting to continue to advance safe functional mobility, address ongoing impairments in strength, coordination, balance, activity tolerance, cognition, safety awareness, and to minimize fall risk.  Equipment: No equipment provided  Reasons for discharge: treatment goals met and discharge from hospital  Patient/family agrees with progress made and goals achieved: Yes  PT Discharge Precautions/Restrictions Precautions Precautions: Fall Precaution Comments: mild R hemipareisis Restrictions Weight Bearing Restrictions: No Vital Signs   Pain Pain Assessment Pain Scale: 0-10 Pain Score: 0-No pain Faces Pain Scale: No hurt Pain Interference Pain Interference Pain Effect on Sleep: 0. Does not apply - I have not had any pain or hurting in the past 5 days Pain Interference with Therapy Activities: 0. Does not apply - I have not received rehabilitationtherapy in the past 5 days Pain Interference with Day-to-Day Activities: 1. Rarely or not at all Vision/Perception  Vision - History Ability to See in Adequate Light: 0 Adequate Vision - Assessment Eye Alignment: Within Functional Limits Ocular Range of Motion: Within Functional  Limits Alignment/Gaze Preference: Within Defined Limits Tracking/Visual Pursuits: Able to track stimulus in all quads without difficulty Convergence: Within functional limits Perception Perception: Within Functional Limits Praxis Praxis: WFL  Cognition Overall Cognitive Status: Within Functional Limits for tasks assessed Arousal/Alertness: Awake/alert Orientation Level: Oriented X4 Selective Attention: Appears intact Memory: Appears intact Awareness: Appears intact Problem Solving: Appears intact Safety/Judgment: Appears intact Sensation Sensation Light Touch: Appears Intact Coordination Gross Motor Movements are Fluid and Coordinated: No Fine Motor Movements are Fluid and Coordinated: No Coordination and Movement Description: RUE/RLE weakness Heel Shin Test: LLE WFL, RLE slow to perform Motor  Motor Motor: Other (comment) (hemipareisis) Motor - Discharge Observations: R sided hemipareisis; mild and improving since eval  Mobility Bed Mobility Bed Mobility: Sit to Supine;Supine to Sit Rolling Right: Independent Rolling Left: Independent Supine to Sit: Independent Sitting - Scoot to Edge of Bed: Independent Sit to Supine: Independent Transfers Transfers: Sit to Stand;Stand to [employer] Transfers Sit to Stand: Independent Stand to Sit: Independent Stand Pivot Transfers: Independent Transfer (Assistive device): None Locomotion  Gait Ambulation: Yes Gait Assistance: Independent Gait Distance (Feet): 640 Feet Assistive device: None Gait Gait: Yes Gait Pattern: Step-through pattern;Decreased dorsiflexion - right Gait velocity: Decreased at 0.35 m/s Stairs / Additional Locomotion Stairs: Yes Stairs Assistance: Independent with assistive device Stair Management Technique: One rail Right Number of Stairs: 20 Height of Stairs: 6 Ramp: Independent with assistive device Curb: Independent Wheelchair Mobility Wheelchair Mobility: No  Trunk/Postural Assessment   Cervical Assessment Cervical Assessment: Within Functional Limits Thoracic Assessment Thoracic Assessment: Within Functional Limits Lumbar Assessment Lumbar Assessment: Within Functional Limits Postural Control Postural Control: Within Functional Limits  Balance Balance Balance Assessed: Yes Standardized Balance Assessment Standardized Balance Assessment: Berg Balance Test;Functional Gait Assessment Berg Balance Test Sit to Stand: Able to stand without using hands and  stabilize independently Standing Unsupported: Able to stand safely 2 minutes Sitting with Back Unsupported but Feet Supported on Floor or Stool: Able to sit safely and securely 2 minutes Stand to Sit: Sits safely with minimal use of hands Transfers: Able to transfer safely, minor use of hands Standing Unsupported with Eyes Closed: Able to stand 10 seconds safely Standing Ubsupported with Feet Together: Able to place feet together independently and stand for 1 minute with supervision From Standing, Reach Forward with Outstretched Arm: Can reach confidently >25 cm (10") From Standing Position, Pick up Object from Floor: Able to pick up shoe safely and easily From Standing Position, Turn to Look Behind Over each Shoulder: Looks behind from both sides and weight shifts well Turn 360 Degrees: Able to turn 360 degrees safely one side only in 4 seconds or less Standing Unsupported, Alternately Place Feet on Step/Stool: Able to stand independently and safely and complete 8 steps in 20 seconds Standing Unsupported, One Foot in Front: Able to place foot tandem independently and hold 30 seconds Standing on One Leg: Able to lift leg independently and hold 5-10 seconds Total Score: 53 Static Sitting Balance Static Sitting - Balance Support: Feet supported Static Sitting - Level of Assistance: 7: Independent Dynamic Sitting Balance Dynamic Sitting - Balance Support: Feet supported Dynamic Sitting - Level of Assistance: 7:  Independent Static Standing Balance Static Standing - Balance Support: No upper extremity supported Static Standing - Level of Assistance: 7: Independent Dynamic Standing Balance Dynamic Standing - Balance Support: No upper extremity supported Dynamic Standing - Level of Assistance: 6: Modified independent (Device/Increase time) Functional Gait  Assessment Gait assessed : Yes Gait Level Surface: Walks 20 ft in less than 5.5 sec, no assistive devices, good speed, no evidence for imbalance, normal gait pattern, deviates no more than 6 in outside of the 12 in walkway width. Change in Gait Speed: Able to change speed, demonstrates mild gait deviations, deviates 6-10 in outside of the 12 in walkway width, or no gait deviations, unable to achieve a major change in velocity, or uses a change in velocity, or uses an assistive device. Gait with Horizontal Head Turns: Performs head turns smoothly with no change in gait. Deviates no more than 6 in outside 12 in walkway width Gait with Vertical Head Turns: Performs head turns with no change in gait. Deviates no more than 6 in outside 12 in walkway width. Gait and Pivot Turn: Pivot turns safely in greater than 3 sec and stops with no loss of balance, or pivot turns safely within 3 sec and stops with mild imbalance, requires small steps to catch balance. Step Over Obstacle: Is able to step over one shoe box (4.5 in total height) without changing gait speed. No evidence of imbalance. Gait with Narrow Base of Support: Ambulates 4-7 steps. Gait with Eyes Closed: Walks 20 ft, no assistive devices, good speed, no evidence of imbalance, normal gait pattern, deviates no more than 6 in outside 12 in walkway width. Ambulates 20 ft in less than 7 sec. Ambulating Backwards: Walks 20 ft, no assistive devices, good speed, no evidence for imbalance, normal gait Steps: Alternating feet, must use rail. Total Score: 24 FGA comment:: 19-24 = medium/low fall risk Extremity  Assessment  RUE Assessment RUE Assessment: Exceptions to [facility] Active Range of Motion (AROM) Comments: WFL General Strength Comments: 4-/5 throughout UE LUE Assessment LUE Assessment: Within Functional Limits RLE Assessment RLE Assessment: Exceptions to [facility] General Strength Comments: grossly 4/5 with 4-/5 for ankle DF LLE  Assessment LLE Assessment: Within Functional Limits General Strength Comments: grossly 4+/5 proximal to distal  Skilled Intervention: PT instructed pt in Grad day assessment to measure progress toward goals. See above/ below for details. CARETool mobility assessment  also completed; see CARETool tab in navigator for details.  Pt has demonstrated significant improvement in short time in safety awareness, balance, quality of gait, and activity tolerance. Significant improvement in Outcome Measures with Berg Balance Test and Functional Gait Assessment. See above for scores.    Loel Dubonnet PT, DPT, CSRS 06/06/2023, 9:04 AM

## 2023-06-06 NOTE — Progress Notes (Signed)
Occupational Therapy Session Note  Patient Details  Name: Rachael Aguilar MRN: [id number] Date of Birth: [birth year]  Today's Date: 06/06/2023 OT Individual Time: 4782-9562 session 1  OT Individual Time Calculation (min): 70 min  Session 2: 1308-6578   Short Term Goals: Week 1:  OT Short Term Goal 1 (Week 1): STGs = LTGs  Skilled Therapeutic Interventions/Progress Updates:   Session 1: Pt greeted seated EOB, pt agreeable to OT intervention.      Transfers/bed mobility: pt completed all functional mobility with no AD independently.   Therapeutic activity: pt completed standing dynamic balance task with pt instructed to reach out of BOS to place rings on squigz positioned on mirror with an emphasis on [facility] in RUE. Pt completed task with supervision.    IADLS: pt completed ambulatory laundry task with pt instructed to collect clothes from floor level and place in laundry basket to simulate IADLS at home. Pt completed task independently. Pt ambulated to laundry room holding basket with pt then able to place items in washer/dryer independently.pt even able to stand on airex cushion, retreive clothes from knee level and then fold items on side table with no LOB.   Pt ambulated greater than a household distance to apt while carrying weighted laundry basket with no LOB. Pt able to make up bed in apt with no LOB with an emphasis on energy conservation.   Pt additionally completed kithcen task with pt able to reach [facility] into cabinets and below knee level to retrieve items to simulate meal prep. Pt completed task independently. Pt even able to set the table while transporting cups of water from one surface>another.   Assessments:   Box and Blocks Test measures unilateral gross manual dexterity. - Instructions The pt was instructed to carry one block over at a time and go as quickly as they could, making sure their fingertips crossed the partition. One minute was given to complete the task per UE.  The pt was allowed a 15-second trial period prior to testing if needed. - Results The pt transferred 42 blocks with the R hand and 49 with the L hand. The total number of blocks carried from one compartment to the other in one minute is scored per hand. Higher scores on the test indicate better gross manual dexterity.   - Norms for adults females 50-75+ 50-54 R 77.7 L 74.3 55-59 R 74.7 L 73.6 60-64 R 76.1 L 73.6 65-69 R 72 L 71.3 70-74 R 68.6 L 68.3 75+ R 65.0 L 63.6  9 Hole Peg Test is used to measure finger dexterity in pts with various neurological diagnoses. - Instructions The pt was instructed to pick up the pegs one at a time, using their dominant hand first and put them into the holes in any order until the holes were all filled. The pt then removed the pegs one at a time and returned them to the container. Both hands were tested separately.  - Results The pt completed the test in RUE- 32 seconds, LUE-  27 secs. Scores are based on the time taken to complete the activity. The timer started the moment the pt touched the first peg until the moment the last peg hit the container.   - Norms for healthy females ages 49-70+ 80-55 R 17.38 L 18.92 56-60 R 17.86 L 19.48 61-65 R 18.99 L 20.33 66-70 R 19.90 L 21.44 71+ R 22.49 L 24.11   Ended session with pt seated EOB, pt independent in the room.  Session 2:  Pt greeted supine in bed, pt agreeable to OT intervention.      Transfers/bed mobility: pt completed supine>sit independently. Pt completed all functional mobility with no AD independently.   Exercises: issued pt theraputty for home to facilitate improved hand strength in affected RUE. Provided handout to increase carryover.   Therapeutic activities:  Pt completed seated FMC task with pt instructed to use RUE to place push pins in corkboard in outline indicated on board. Pt completed task with supervision.                 Ended session with pt seated EOB, pt  independent in her room.   Therapy Documentation Precautions:  Precautions Precautions: Fall Precaution Comments: mild R hemipareisis Restrictions Weight Bearing Restrictions: No  Pain: No pain reported during either session    Therapy/Group: Individual Therapy  [name] 06/06/2023, 12:11 PM

## 2023-06-06 NOTE — Discharge Instructions (Addendum)
Inpatient Rehab Discharge Instructions  Rachael Aguilar Discharge date and time: 06/06/23   Activities/Precautions/ Functional Status: Activity: no lifting, driving, or strenuous exercise  till cleared by mD Diet: cardiac diet and diabetic diet Wound Care: none needed   Functional status:  ___ No restrictions     ___ Walk up steps independently ___ 24/7 supervision/assistance   ___ Walk up steps with assistance _X__ Intermittent supervision/assistance  _X__ Bathe/dress independently ___ Walk with walker     ___ Bathe/dress with assistance ___ Walk Independently    ___ Shower independently ___ Walk with assistance    ___ Shower with assistance _X__ No alcohol     ___ Return to work/school ________  Special Instructions: Continue exercise plan 2-3 times a day No driving till cleared by MD 3. Need family to assist with medications, finances and other cognitive tasks.     COMMUNITY REFERRALS UPON DISCHARGE:    Home Exercise Program given to patient   Medical Equipment/Items Ordered:tub bench                                                 Agency/Supplier:[facility]   [phone]  Match placed for prescription assistance    STROKE/TIA DISCHARGE INSTRUCTIONS SMOKING Cigarette smoking nearly doubles your risk of having a stroke & is the single most alterable risk factor  If you smoke or have smoked in the last 12 months, you are advised to quit smoking for your health. Most of the excess cardiovascular risk related to smoking disappears within a year of stopping. Ask you doctor about anti-smoking medications [place] Quit Line: 1-800-QUIT NOW Free Smoking Cessation Classes [phone]  CHOLESTEROL Know your levels; limit fat & cholesterol in your diet  Lipid Panel     Component Value Date/Time   CHOL 173 05/29/2023 0507   TRIG 64 05/29/2023 0507   HDL 42 05/29/2023 0507   CHOLHDL 4.1 05/29/2023 0507   VLDL 13 05/29/2023 0507   LDLCALC 118 (H) 05/29/2023 0507     Many  patients benefit from treatment even if their cholesterol is at goal. Goal: Total Cholesterol (CHOL) less than 160 Goal:  Triglycerides (TRIG) less than 150 Goal:  HDL greater than 40 Goal:  LDL (LDLCALC) less than 100   BLOOD PRESSURE American Stroke Association blood pressure target is less that 120/80 mm/Hg  Your discharge blood pressure is:  BP: 114/70 Monitor your blood pressure Limit your salt and alcohol intake Many individuals will require more than one medication for high blood pressure  DIABETES (A1c is a blood sugar average for last 3 months) Goal HGBA1c is under 7% (HBGA1c is blood sugar average for last 3 months)  Diabetes: New diagnosis of stroke    Lab Results  Component Value Date   HGBA1C 6.6 (H) 05/29/2023    Your HGBA1c can be lowered with medications, healthy diet, and exercise. Check your blood sugar as directed by your physician Call your physician if you experience unexplained or low blood sugars.  PHYSICAL ACTIVITY/REHABILITATION Goal is 30 minutes at least 4 days per week  Activity: Increase activity slowly, and No driving, Therapies: See above Return to work:  N/A Activity decreases your risk of heart attack and stroke and makes your heart stronger.  It helps control your weight and blood pressure; helps you relax and can improve your mood. Participate in a  regular exercise program. Talk with your doctor about the best form of exercise for you (dancing, walking, swimming, cycling).  DIET/WEIGHT Goal is to maintain a healthy weight  Your discharge diet is:  Diet Order             Diet heart healthy/carb modified Room service appropriate? Yes with Assist; Fluid consistency: Thin  Diet effective now                   liquids Your height is:  Height: 5\' 4"  (162.6 cm) Your current weight is: Weight: 77.2 kg Your Body Mass Index (BMI) is:  BMI (Calculated): 29.2 Following the type of diet specifically designed for you will help prevent another  stroke. Your goal weight is   Your goal Body Mass Index (BMI) is 19-24. Healthy food habits can help reduce 3 risk factors for stroke:  High cholesterol, hypertension, and excess weight.  RESOURCES Stroke/Support Group:  Call [phone]   STROKE EDUCATION PROVIDED/REVIEWED AND GIVEN TO PATIENT Stroke warning signs and symptoms How to activate emergency medical system (call 911). Medications prescribed at discharge. Need for follow-up after discharge. Personal risk factors for stroke. Pneumonia vaccine given:  Flu vaccine given:  My questions have been answered, the writing is legible, and I understand these instructions.  I will adhere to these goals & educational materials that have been provided to me after my discharge from the hospital.     My questions have been answered and I understand these instructions. I will adhere to these goals and the provided educational materials after my discharge from the hospital.  Patient/Caregiver Signature _______________________________ Date __________  Clinician Signature _______________________________________ Date __________  Please bring this form and your medication list with you to all your follow-up doctor's appointments.

## 2023-06-06 NOTE — Progress Notes (Signed)
Inpatient Rehabilitation Care Coordinator Discharge Note   Patient Details  Name: Rachael Aguilar MRN: [id number] Date of Birth: 1965/08/10   Discharge location: home to daugher's who can provide supervision assist  Length of Stay: 5 days  Discharge activity level: supervision level  Home/community participation: active  Patient response WU:[id number] Literacy - How often do you need to have someone help you when you read instructions, pamphlets, or other written material from your doctor or pharmacy?: Never  Patient response YN:WGNFAO Isolation - How often do you feel lonely or isolated from those around you?: Never  Services provided included: MD, RD, PT, OT, RN, CM, Pharmacy, SW  Financial Services:  Financial Services Utilized: Other (Comment) (uninsured)    Choices offered to/list presented to: pt  Follow-up services arranged:  DME, Patient/Family has no preference for HH/DME agencies      DME : [employer] Home exercise program given to patient     Patient response to transportation need: Is the patient able to respond to transportation needs?: Yes In the past 12 months, has lack of transportation kept you from medical appointments or from getting medications?: No In the past 12 months, has lack of transportation kept you from meetings, work, or from getting things needed for daily living?: No   Patient/Family verbalized understanding of follow-up arrangements:  Yes  Individual responsible for coordination of the follow-up plan: self & Chasity-daughter [phone]  Confirmed correct DME delivered: Lucy Chris 06/06/2023    Comments (or additional information):Pt requested to dc day early and MD and team felt ready to dc  Summary of Stay    Date/Time Discharge Planning CSW  05/31/23 1523 New admission. Assesment pending CJB       Syra Sirmons, Lemar Livings

## 2023-06-06 NOTE — Progress Notes (Signed)
Occupational Therapy Discharge Summary  Patient Details  Name: Rachael Aguilar MRN: [id number] Date of Birth: 1966-03-21  Date of Discharge from OT service:June 06, 2023  Today's Date: 06/06/2023    Patient has met 16 of 16 long term goals due to {due to:3041651}. Pt has made excellent progress during LOS d/t improvements in balance, RUE coordination, activity tolerance, strength, balanace and overall endurance. Pt completes ambulatory ADL transfers with no AD independently. Pt completes all ADLS independently. Pt to DC home with her daughter for a short time before returning back to her home.  Patient to discharge at overall {LOA:3049010} level.  Patient's care partner {care partner:3041650} to provide the necessary {assistance:3041652} assistance at discharge.    Reasons goals not met: NA  Recommendation:  Patient will benefit from ongoing skilled OT services in {setting:3041680} to continue to advance functional skills in the area of {ADL/iADL:3041649}.  Equipment: TTB  Reasons for discharge: {Reason for discharge:3049018}  Patient/family agrees with progress made and goals achieved: {Pt/Family agree with progress/goals:3049020}  OT Discharge Precautions/Restrictions  Precautions Precautions: Fall Precaution Comments: mild R hemipareisis Restrictions Weight Bearing Restrictions: No  ADL ADL Eating: Independent Grooming: Independent Where Assessed-Grooming: Standing at sink Upper Body Bathing: Independent Where Assessed-Upper Body Bathing: Shower Lower Body Bathing: Independent Where Assessed-Lower Body Bathing: Shower Upper Body Dressing: Independent Where Assessed-Upper Body Dressing: Edge of bed Lower Body Dressing: Independent Where Assessed-Lower Body Dressing: Edge of bed Toileting: Independent Where Assessed-Toileting: Teacher, adult education: Community education officer Method: Insurance claims handler: Designer, industrial/product Method:  Ship broker: Insurance underwriter: Administrator, arts Method: Designer, industrial/product: Sales promotion account executive Baseline Vision/History: 1 Wears glasses (readers) Patient Visual Report: No change from baseline Vision Assessment?: No apparent visual deficits Eye Alignment: Within Functional Limits Ocular Range of Motion: Within Functional Limits Alignment/Gaze Preference: Within Defined Limits Tracking/Visual Pursuits: Able to track stimulus in all quads without difficulty Convergence: Within functional limits Visual Fields: No apparent deficits Perception  Perception: Within Functional Limits Praxis Praxis: WFL Cognition Cognition Overall Cognitive Status: Within Functional Limits for tasks assessed Arousal/Alertness: Awake/alert Memory: Appears intact Selective Attention: Appears intact Awareness: Appears intact Problem Solving: Appears intact Safety/Judgment: Appears intact Brief Interview for Mental Status (BIMS) Repetition of Three Words (First Attempt): 3 Temporal Orientation: Year: Correct Temporal Orientation: Month: Accurate within 5 days Temporal Orientation: Day: Correct Recall: "Sock": Yes, no cue required Recall: "Blue": Yes, no cue required Recall: "Bed": Yes, no cue required BIMS Summary Score: 15 Sensation Sensation Light Touch: Appears Intact Coordination Gross Motor Movements are Fluid and Coordinated: No Fine Motor Movements are Fluid and Coordinated: No Coordination and Movement Description: RUE/RLE weakness Heel Shin Test: LLE WFL, RLE slow to perform Motor  Motor Motor: Other (comment) (hemiparesis) Motor - Discharge Observations: R sided hemipareisis; mild and improving since eval Mobility  Bed Mobility Bed Mobility: Sit to Supine;Supine to Sit Rolling Right: Independent Rolling Left: Independent Supine to Sit: Independent Sitting - Scoot to Edge of Bed:  Independent Sit to Supine: Independent Transfers Sit to Stand: Independent Stand to Sit: Independent  Trunk/Postural Assessment  Cervical Assessment Cervical Assessment: Within Functional Limits Thoracic Assessment Thoracic Assessment: Within Functional Limits Lumbar Assessment Lumbar Assessment: Within Functional Limits Postural Control Postural Control: Within Functional Limits  Balance Dynamic Sitting Balance Dynamic Sitting - Balance Support: Feet supported Dynamic Sitting - Level of Assistance: 7: Independent Static Standing Balance Static Standing - Balance Support: No upper extremity supported Static Standing - Level  of Assistance: 7: Independent Dynamic Standing Balance Dynamic Standing - Balance Support: No upper extremity supported Dynamic Standing - Level of Assistance: 6: Modified independent (Device/Increase time) Extremity/Trunk Assessment RUE Assessment RUE Assessment: Exceptions to [facility] Active Range of Motion (AROM) Comments: WFL General Strength Comments: 4-/5 throughout UE LUE Assessment LUE Assessment: Within Functional Limits   [name] 06/06/2023, 9:44 AM

## 2023-06-06 NOTE — Discharge Summary (Signed)
Physician Discharge Summary  Patient ID: Rachael Aguilar MRN: [id number] DOB/AGE: 1965/11/17 57 y.o.  Admit date: 05/31/2023 Discharge date: 06/06/2023  Discharge Diagnoses:  Principal Problem:   Left pontine stroke [facility]) Active Problems:   Mood disorder (HCC) High blood pressure T2DM- new diagnosis.   Discharged Condition: stable  Significant Diagnostic Studies: n/A   Labs:  Basic Metabolic Panel: Recent Labs  Lab 05/31/23 1413 06/01/23 0454  NA 141 140  K 3.9 3.5  CL 106 107  CO2 25 23  GLUCOSE 117* 127*  BUN 17 18  CREATININE 0.79 0.69  CALCIUM 9.4 9.4    CBC: Recent Labs  Lab 06/01/23 0454 06/06/23 0725  WBC 6.2 7.9  NEUTROABS 2.9  --   HGB 14.9 14.2  HCT 44.2 42.9  MCV 92.3 91.3  PLT 286 330    CBG: Recent Labs  Lab 06/05/23 1151 06/05/23 1627 06/05/23 2132 06/06/23 0600 06/06/23 1136  GLUCAP 118* 130* 128* 133* 118*    Brief HPI:   Rachael Aguilar is a 57 y.o. female with history of HTN, lower GI bleed who was admitted to [facility] on 05/28/2023 with right-sided weakness and slurred speech of 48 hours duration.  Blood pressure elevated at 192/108 at admission.  CTA head negative for LVO.  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  Dr. Otelia Limes recommended DAPT x 21 days followed by aspirin alone for stroke felt to be due to small vessel disease.  Therapy was working with patient was limited by L right-sided weakness affecting mobility and ADLs.  CIR was recommended due to functional decline.   Hospital Course: Rachael Aguilar was admitted to rehab 05/31/2023 for inpatient therapies to consist of PT, ST and OT at least three hours five days a week. Past admission physiatrist, therapy team and rehab RN have worked together to provide customized collaborative inpatient rehab.  P.o. intake has been good and she is continent of bowel and bladder.  Her blood  pressures were monitored on TID basis and has been controlled on amlodipine and Cozaar. Follow-up check of BMET showed electrolytes and renal status to be within normal limits.  CBC shows H&H and platelets to be stable on DAPT.  She is to continue on Plavix for 13 more days and was advised to continue aspirin indefinitely.  She was found to have 6.6 with new diagnosis of diabetes and her blood sugars were monitored with ac/hs CBG checks and SSI was use prn for tighter BS control.  Metformin was added with improvement in blood sugar control.  She has been educated on carb modified diet and is to follow-up with PCP for further titration of meds.  She made good gains during her rehab stay and is modified independent in supervised setting.  Home exercise plans given to patient due to lack of insurance and lack of charity care. Family will provide supervision to assist with transition to home setting.    Rehab course: During patient's stay in rehab weekly team conferences were held to monitor patient's progress, set goals and discuss barriers to discharge. At admission, patient required supervision with ADL task and contact-guard assist with mobility. She exhibited mild deficits in memory. She  has had improvement in activity tolerance, balance, postural control as well as ability to compensate for deficits.  She is able to complete ADL tasks independently.She is independent for transfers and to ambulate 640 feet without  assistive device.  She requires supervision for mild complete cognitive supervision is recommended when out of home setting.  Discharge disposition: 01-Home or Self Care  Diet: Heart healthy/carb modified  Special Instructions: Continue Plavix for 13 more days No driving till cleared by MD.   Discharge Instructions     Amb Referral to Nutrition and Diabetic Education   Complete by: As directed    Ambulatory referral to Neurology   Complete by: As directed    An appointment is requested  in approximately: 4-6 weeks   Ambulatory referral to Physical Medicine Rehab   Complete by: As directed       Allergies as of 06/06/2023   No Known Allergies      Medication List     STOP taking these medications    cyclobenzaprine 10 MG tablet Commonly known as: FLEXERIL   dextromethorphan-guaiFENesin 30-600 MG 12hr tablet Commonly known as: MUCINEX DM   mometasone 0.1 % cream Commonly known as: ELOCON       TAKE these medications    amLODipine 10 MG tablet Commonly known as: NORVASC Take 1 tablet (10 mg total) by mouth daily.   aspirin EC 81 MG tablet Take 1 tablet (81 mg total) by mouth daily. Swallow whole.   atorvastatin 40 MG tablet Commonly known as: LIPITOR Take 1 tablet (40 mg total) by mouth daily.   clopidogrel 75 MG tablet Commonly known as: PLAVIX Take 1 tablet (75 mg total) by mouth daily.   losartan 25 MG tablet Commonly known as: COZAAR Take 1 tablet (25 mg total) by mouth daily.   magnesium oxide 400 MG tablet Commonly known as: MAG-OX Take 0.5 tablets (200 mg total) by mouth at bedtime.   metFORMIN 500 MG tablet Commonly known as: GLUCOPHAGE Take 1 tablet (500 mg total) by mouth daily with breakfast. Start taking on: June 07, 2023   traZODone 50 MG tablet Commonly known as: DESYREL Take 0.5-1 tablets (25-50 mg total) by mouth at bedtime as needed for sleep.        Follow-up Information     Bosie Clos, MD Follow up.   Specialty: Family Medicine Why: Call in 1-2 days for post hospital follow up Contact information: [address] [place] [place] [address] [phone]         Erick Colace, MD Follow up.   Specialty: Physical Medicine and Rehabilitation Why: office will call you with follow up appointment Contact information: [address] [place] [address] [phone]         [facility] Follow up.   Why: office will call you with follow up  appointment Contact information: [address]     [address] [place] [address] [phone]                Signed: Jacquelynn Cree 06/06/2023, 12:00 PM

## 2023-06-06 NOTE — Progress Notes (Signed)
Speech Language Pathology Discharge Summary  Patient Details  Name: Rachael Aguilar MRN: [id number] Date of Birth: 05-Jan-1966  Date of Discharge from SLP service:June 06, 2023  Today's Date: 06/06/2023 SLP Individual Time: 1751-0258 SLP Individual Time Calculation (min): 28 min  Skilled Therapeutic Interventions:  SLP conducted skilled therapy session targeting cognitive retraining goals. Patient reports that she is nearing cognitive and motor speech baseline, though does report that her memory is not quite at 100%. SLP administered SLUMS examination to determine lingering cognitive deficits pre-discharge with patient scoring 18/30 and demonstrating most prominent remaining deficits in the areas of attention, problem solving, and working memory. SLP alerted patient to results with patient verbalizing understanding. Patient is returning to live at her daughter's home and will benefit from cognitive supervision for complex tasks. Patient independently reports memory strategies that she can use though endorses she does not use these consistently. SLP encouraged patient to acquire a notebook/planner upon discharge and fill it with important information to reference as needed. Patient is appropriate for discharge, see discharge summary below.   Patient has met 4 of 4 long term goals.  Patient to discharge at overall Supervision level.  Reasons goals not met: n/a   Clinical Impression/Discharge Summary: Patient has met all long term goals set for admission and is discharging at an overall supervision level for mildly complex cognitive tasks and modified independent level for use of speech intelligibility increasing strategies at the conversation level. Patient's intellectual awareness is improving and patient appears to cognitively handle information re: discharge and upcoming responsibilities with modified independence. SLP and patient discussed need for further SLP services at next venue of care to  continue to target lingering cognitive deficits, patient in agreement. Patient and family education complete. Patient is appropriate for discharge from speech therapy services at the CIR level.   Care Partner:  Caregiver Able to Provide Assistance: Yes  Type of Caregiver Assistance: Cognitive  Recommendation:  Outpatient SLP  Rationale for SLP Follow Up: Maximize cognitive function and independence   Equipment: n/a   Reasons for discharge: Discharged from hospital   Patient/Family Agrees with Progress Made and Goals Achieved: Yes   Jeannie Done, M.A., CCC-SLP  Yetta Barre 06/06/2023, 11:14 AM

## 2023-06-06 NOTE — Progress Notes (Addendum)
PROGRESS NOTE   Subjective/Complaints:  No issues overnite , pt would like to discharge home early   ROS- denies CP, SOB, N/V/D, pain  Objective:   No results found. Recent Labs    06/06/23 0725  WBC 7.9  HGB 14.2  HCT 42.9  PLT 330    No results for input(s): "NA", "K", "CL", "CO2", "GLUCOSE", "BUN", "CREATININE", "CALCIUM" in the last 72 hours.   Intake/Output Summary (Last 24 hours) at 06/06/2023 1023 Last data filed at 06/06/2023 0737 Gross per 24 hour  Intake 960 ml  Output --  Net 960 ml        Physical Exam: Vital Signs Blood pressure 114/70, pulse 82, temperature 97.9 F (36.6 C), temperature source Oral, resp. rate 16, height 5\' 4"  (1.626 m), weight 77.2 kg, last menstrual period 10/13/2020, SpO2 99%.   General: No acute distress Mood and affect are appropriate Heart: Regular rate and rhythm no rubs murmurs or extra sounds Lungs: Clear to auscultation, breathing unlabored, no rales or wheezes Abdomen: Positive bowel sounds, soft nontender to palpation, nondistended Extremities: No clubbing, cyanosis, or edema Skin: No evidence of breakdown, no evidence of rash Neurologic: Cranial nerves II through XII intact, motor strength is 5/5 in left and 4- RIght deltoid, bicep, tricep, grip,5/5 left and 4/5 Right  hip flexor, 5/5 left and 4/5 RIght knee extensors, 5/5 left and 4/5 right ankle dorsiflexor and 5/5 l;eft and 4/5 right plantar flexor, stable 10/27 Sensory exam normal sensation to light touch lower extremities Reduced fine motor Right finger to thumb   Musculoskeletal: Full range of motion in all 4 extremities. No joint swelling   Assessment/Plan: Left pontine infarct with RIght HP Stable for D/C today F/u PCP in 2 weeks F/u PM&R 3-4 weeks F/u Neuro 1-2 mo See D/C summary See D/C instructions  No RTW until cleared at f/u OV Care Tool:  Bathing    Body parts bathed by patient: Right  arm, Left arm, Chest, Abdomen, Front perineal area, Buttocks, Right upper leg, Face, Left lower leg, Right lower leg, Left upper leg         Bathing assist Assist Level: Supervision/Verbal cueing     Upper Body Dressing/Undressing Upper body dressing   What is the patient wearing?: Bra, Pull over shirt    Upper body assist Assist Level: Set up assist    Lower Body Dressing/Undressing Lower body dressing      What is the patient wearing?: Underwear/pull up, Pants     Lower body assist Assist for lower body dressing: Supervision/Verbal cueing     Toileting Toileting    Toileting assist Assist for toileting: Supervision/Verbal cueing     Transfers Chair/bed transfer  Transfers assist     Chair/bed transfer assist level: Contact Guard/Touching assist     Locomotion Ambulation   Ambulation assist      Assist level: Contact Guard/Touching assist Assistive device: No Device Max distance: 200 ft   Walk 10 feet activity   Assist     Assist level: Contact Guard/Touching assist Assistive device: No Device   Walk 50 feet activity   Assist    Assist level: Contact Guard/Touching assist Assistive device: No  Device    Walk 150 feet activity   Assist    Assist level: Contact Guard/Touching assist Assistive device: No Device    Walk 10 feet on uneven surface  activity   Assist     Assist level: Contact Guard/Touching assist Assistive device:  (none)   Wheelchair     Assist Is the patient using a wheelchair?: No             Wheelchair 50 feet with 2 turns activity    Assist            Wheelchair 150 feet activity     Assist          Blood pressure 114/70, pulse 82, temperature 97.9 F (36.6 C), temperature source Oral, resp. rate 16, height 5\' 4"  (1.626 m), weight 77.2 kg, last menstrual period 10/13/2020, SpO2 99%.  Medical Problem List and Plan: 1. Functional deficits secondary to a left pontine infarct              -patient may  shower             -ELOS/Goals: 7-10 days, supervision to mod I goals with PT, OT, SLP  Continue CIR 2.  Antithrombotics: -DVT/anticoagulation:  Pharmaceutical: Lovenox             -antiplatelet therapy: DAPT x 3 weeks followed by ASA alone. 3. Pain Management: tylenol prn.  4. Mood/Behavior/Sleep:  LCSW to follow for evaluation and support.             --continue Melatonin prn for sleep.              -antipsychotic agents: N/A 5. Neuropsych/cognition: This patient is capable of making decisions on his own behalf. 6. Skin/Wound Care: Routine pressure relief measures.  7. Fluids/Electrolytes/Nutrition:  Monitor I/O. Check CMET in am  8. HTN: Monitor BP TID--continue Norvasc and Cozaar (question compliant PTA)            Vitals:   06/05/23 1957 06/06/23 0440  BP: (!) 146/100 114/70  Pulse: 92 82  Resp: 16 16  Temp: 97.8 F (36.6 C) 97.9 F (36.6 C)  SpO2: 100% 99%  Am BP improved may d/c home on current meds   10. T2DM: Hgb A1C- 6.6 and new diagonsis. Will monitor BS ac/hs and use SSI for now --change diet to Carb Mod and consult RD for diet education CBG (last 3)  Recent Labs    06/05/23 1627 06/05/23 2132 06/06/23 0600  GLUCAP 130* 128* 133*  10/28 controlled   11. GERD: continue PPI 12. Intermittent hypokalemia:  improved , recheck Monday     Latest Ref Rng & Units 06/01/2023    4:54 AM 05/31/2023    2:13 PM 05/29/2023    5:07 AM  BMP  Glucose 70 - 99 mg/dL 657  846  962   BUN 6 - 20 mg/dL 18  17  9    Creatinine 0.44 - 1.00 mg/dL 9.52  8.41  3.24   Sodium 135 - 145 mmol/L 140  141  139   Potassium 3.5 - 5.1 mmol/L 3.5  3.9  3.0   Chloride 98 - 111 mmol/L 107  106  103   CO2 22 - 32 mmol/L 23  25  26    Calcium 8.9 - 10.3 mg/dL 9.4  9.4  8.8    . 13.. Bereavement: Lost a friend recently.    LOS: 6 days A FACE TO FACE EVALUATION WAS PERFORMED  Erick Colace 06/06/2023,  10:23 AM

## 2023-06-06 NOTE — Progress Notes (Signed)
Patient ID: Rachael Aguilar, female   DOB: May 15, 1966, 57 y.o.   MRN: [id number] Team and MD feel pt ready to discharge home today. Will see if equipment can be delivered and prescription assist in. Work on discharge today

## 2023-06-06 NOTE — Progress Notes (Signed)
Reached out to Dr. Nelson Chimes to clarify duration of DAPT and she recommended 3 weeks followed by ASA per Dr. Shelbie Hutching note.

## 2023-06-06 NOTE — Progress Notes (Signed)
Inpatient Rehabilitation Discharge Medication Review by a Pharmacist  A complete drug regimen review was completed for this patient to identify any potential clinically significant medication issues.   High Risk Drug Classes Is patient taking? Indication by Medication  Antipsychotic No   Anticoagulant No   Antibiotic No    Opioid No    Antiplatelet Yes Plavix, aspirin - stroke  Hypoglycemics/insulin Yes Metformin - diabetes  Vasoactive Medication Yes Amlodipine, losartan - HTN  Chemotherapy No    Other Yes Trazadone - sleep Lipitor - HLD stroke        Type of Medication Issue Identified Description of Issue Recommendation(s)  Drug Interaction(s) (clinically significant)        Duplicate Therapy        Allergy        No Medication Administration End Date        Incorrect Dose        Additional Drug Therapy Needed        Significant med changes from prior encounter (inform family/care partners about these prior to discharge).      Other            Clinically significant medication issues were identified that warrant physician communication and completion of prescribed/recommended actions by midnight of the next day:  No    Time spent performing this drug regimen review (minutes):  20   Ruben Im, PharmD Clinical Pharmacist 06/06/2023 12:23 PM Please check AMION for all [facility] Pharmacy numbers

## 2023-06-06 NOTE — Plan of Care (Signed)
  Problem: RH Expression Communication Goal: LTG Patient will increase speech intelligibility (SLP) Description: LTG: Patient will increase speech intelligibility at word/phrase/conversation level with cues, % of the time (SLP) Outcome: Completed/Met   Problem: RH Problem Solving Goal: LTG Patient will demonstrate problem solving for (SLP) Description: LTG:  Patient will demonstrate problem solving for basic/complex daily situations with cues  (SLP) Outcome: Completed/Met   Problem: RH Memory Goal: LTG Patient will use memory compensatory aids to (SLP) Description: LTG:  Patient will use memory compensatory aids to recall biographical/new, daily complex information with cues (SLP) Outcome: Completed/Met   Problem: RH Awareness Goal: LTG: Patient will demonstrate awareness during functional activites type of (SLP) Description: LTG: Patient will demonstrate awareness during functional activites type of (SLP) Outcome: Completed/Met

## 2023-06-07 NOTE — Plan of Care (Signed)
  Problem: RH Balance Goal: LTG Patient will maintain dynamic standing balance (PT) Description: LTG:  Patient will maintain dynamic standing balance with assistance during mobility activities (PT) Outcome: Completed/Met Flowsheets (Taken 06/01/2023 1722) LTG: Pt will maintain dynamic standing balance during mobility activities with:: Independent   Problem: Sit to Stand Goal: LTG:  Patient will perform sit to stand with assistance level (PT) Description: LTG:  Patient will perform sit to stand with assistance level (PT) Outcome: Completed/Met Flowsheets (Taken 06/01/2023 1722) LTG: PT will perform sit to stand in preparation for functional mobility with assistance level: Independent   Problem: RH Bed to Chair Transfers Goal: LTG Patient will perform bed/chair transfers w/assist (PT) Description: LTG: Patient will perform bed to chair transfers with assistance (PT). Outcome: Completed/Met Flowsheets (Taken 06/01/2023 1722) LTG: Pt will perform Bed to Chair Transfers with assistance level: Independent   Problem: RH Car Transfers Goal: LTG Patient will perform car transfers with assist (PT) Description: LTG: Patient will perform car transfers with assistance (PT). Outcome: Completed/Met Flowsheets (Taken 06/01/2023 1722) LTG: Pt will perform car transfers with assist:: Supervision/Verbal cueing   Problem: RH Floor Transfers Goal: LTG Patient will perform floor transfers w/assist (PT) Description: LTG: Patient will perform floor transfers with assistance (PT). Outcome: Completed/Met Flowsheets (Taken 06/07/2023 0622) LTG: PT WILL PERFORM FLOOR TRANFERS  WITH  ASSIST:: Independent with assistive device    Problem: RH Ambulation Goal: LTG Patient will ambulate in home environment (PT) Description: LTG: Patient will ambulate in home environment, # of feet with assistance (PT). Outcome: Completed/Met Flowsheets (Taken 06/01/2023 1722) LTG: Pt will ambulate in home environ  assist  needed:: Independent LTG: Ambulation distance in home environment: up to 50 ft per bout Goal: LTG Patient will ambulate in community environment (PT) Description: LTG: Patient will ambulate in community environment, # of feet with assistance (PT). Outcome: Completed/Met Flowsheets (Taken 06/01/2023 1722) LTG: Pt will ambulate in community environ  assist needed:: Supervision/Verbal cueing LTG: Ambulation distance in community environment: >300 ft   Problem: RH Stairs Goal: LTG Patient will ambulate up and down stairs w/assist (PT) Description: LTG: Patient will ambulate up and down # of stairs with assistance (PT) Outcome: Completed/Met Flowsheets (Taken 06/01/2023 1722) LTG: Pt will ambulate up/down stairs assist needed:: Supervision/Verbal cueing LTG: Pt will  ambulate up and down number of stairs: at least 15 steps with HR setup as per home environment

## 2023-06-15 ENCOUNTER — Ambulatory Visit: Payer: Self-pay | Admitting: Dietician

## 2023-06-17 NOTE — Progress Notes (Unsigned)
Subjective:    Patient ID: Rachael Aguilar, female    DOB: [birth year], 57 y.o.   MRN: [id number]  HPI: Rachael Aguilar is a 57 y.o. female who is here for HFU appointment for F/U of her Left Pontine Stroke, Essential Hypertension , Type 2 DM and Reactive Depression [name] was seen at [facility] on 05/28/2023 for right sided weakness and slurred speech of 48 hours duration.   Dr Cox: H&P Note: 05/28/2023 Chief Concern: Right-sided weakness, slurred speech, started 2 days   HPI: Rachael Aguilar is a 57 year old female with history of hypertension, GERD, who presents emergency department for chief concerns of right-sided weakness and slurred speech for 2 days.   Vitals in the ED showed temperature of 98.4, respiration rate 20, heart rate of 102, blood pressure 192/108, SpO2 of 99% on room air.   Serum sodium is 140, potassium 3.0, chloride 102, bicarb 26, BUN of 10, serum creatinine 0.88, nonfasting blood glucose 101, EGFR greater than 60, WBC 6.1, hemoglobin 14.8, platelets of 296.  CT Head: WO Contrast IMPRESSION: Subacute lacunar infarct involving the left caudate nucleus and anterior limb of the internal capsule.   Moderate chronic small vessel disease and old right basal ganglia lacunar infarct.   CTA:  IMPRESSION: 1. No intracranial large vessel occlusion. 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 2. No hemodynamically significant stenosis in the neck. 3. 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  MR Brain: WO Contrast IMPRESSION: 1. Acute infarct in the left pons. 2. 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  Neurology was consulted: Recommended DAPT x 21 days  followed by aspirin alone.   Rachael Aguilar was admitted to inpatient rehabilitation on 05/31/2023 and discharged on 06/06/2023. She states she has pain in her right lower extremity. Due to  no insurance she was given a Home Exercise Program to follow. [facility] was called to scheduled HFU appointment, message left and the will call Ms. Kirn to schedule HFU appointment. Ms. Cahoon and her aunt will schedule HFU appointment with her PCP, she verbalizes understanding.   When Doryan Bahl CMA asked Rachael Aguilar about being Depressed and having Suicidal ideation, she felt Ms. Stjulian was on the fence with her answers, her aunt was in the room with her. This provider asked Rachael Aguilar if she was depressed, she admits to being Depressed for a long time, she recently loss her fiance she reports. She's very tearful emotional support was given. She denies being suicidal or having a suicidal plan, we discussed counseling. At this time she doesn't have insurance and she has a Medicaid appointment, she was instructed to keep her medicaid appointment. She was given [facility] number and [facility], she reports she will call. Her aunts states Rachael Aguilar is at her house , and she will make sure she is safe, and will continue to encourage her to make the above appointments. Emotional support given.  We discussed Emotional Health at length and she verbalizes understanding.   Pain Inventory Average Pain 6 Pain Right Now 6 My pain is constant and aching  LOCATION OF PAIN  right leg, right thigh, right knee  BOWEL Number of stools per week: 1 Oral laxative use No   Enema or suppository use No    BLADDER Normal Bladder incontinence Yes  Frequent urination Yes     Mobility walk  without assistance ability to climb steps?  yes do you drive?  yes  Function employed # of hrs/week currently not working  Neuro/Psych bladder control problems  Prior Studies Any changes since last visit?  no  Physicians involved in your care Any changes since last visit?  no   Family History  Problem Relation Age of Onset   Ovarian cancer Mother    Throat cancer Mother    Hypertension Mother    Lupus  Maternal Grandmother    Rheum arthritis Maternal Aunt    Social History   Socioeconomic History   Marital status: Single    Spouse name: Not on file   Number of children: Not on file   Years of education: Not on file   Highest education level: Not on file  Occupational History   Not on file  Tobacco Use   Smoking status: Never   Smokeless tobacco: Never  Vaping Use   Vaping status: Never Used  Substance and Sexual Activity   Alcohol use: Yes    Alcohol/week: 10.0 standard drinks of alcohol    Types: 10 Shots of liquor per week   Drug use: Not Currently   Sexual activity: Not on file  Other Topics Concern   Not on file  Social History Narrative   Not on file   Social Determinants of Health   Financial Resource Strain: Not on file  Food Insecurity: No Food Insecurity (05/28/2023)   Hunger Vital Sign    Worried About Running Out of Food in the Last Year: Never true    Ran Out of Food in the Last Year: Never true  Transportation Needs: No Transportation Needs (05/28/2023)   PRAPARE - Administrator, Civil Service (Medical): No    Lack of Transportation (Non-Medical): No  Physical Activity: Not on file  Stress: Not on file  Social Connections: Not on file   Past Surgical History:  Procedure Laterality Date   CHOLECYSTECTOMY N/A 04/02/2016   Procedure: LAPAROSCOPIC CHOLECYSTECTOMY;  Surgeon: Leafy Ro, MD;  Location: [facility] ORS;  Service: General;  Laterality: N/A;   DILATION AND CURETTAGE OF UTERUS     ESOPHAGOGASTRODUODENOSCOPY N/A 11/09/2020   Procedure: ESOPHAGOGASTRODUODENOSCOPY (EGD);  Surgeon: Toledo, Boykin Nearing, MD;  Location: [facility] ENDOSCOPY;  Service: Gastroenterology;  Laterality: N/A;   FLEXIBLE SIGMOIDOSCOPY N/A 11/09/2020   Procedure: FLEXIBLE SIGMOIDOSCOPY;  Surgeon: Toledo, Boykin Nearing, MD;  Location: [facility] ENDOSCOPY;  Service: Gastroenterology;  Laterality: N/A;   Past Medical History:  Diagnosis Date   Bipolar depression (HCC)    Hypertension     Lower GI bleed    LMP 10/13/2020 Comment: hcg done in ED  Opioid Risk Score:   Fall Risk Score:  `1  Depression screen [facility] 2/9     04/21/2021    3:17 PM  Depression screen PHQ 2/9  Decreased Interest 0  Down, Depressed, Hopeless 2  PHQ - 2 Score 2  Altered sleeping 1  Tired, decreased energy 0  Change in appetite 0  Feeling bad or failure about yourself  1  Trouble concentrating 0  Moving slowly or fidgety/restless 0  Suicidal thoughts 0  PHQ-9 Score 4  Difficult doing work/chores Not difficult at all    Review of Systems  Genitourinary:  Negative for difficulty urinating.  Psychiatric/Behavioral:         Depression   All other systems reviewed and are negative.      Objective:   Physical Exam Vitals and nursing note reviewed.  Constitutional:      Appearance: Normal appearance.  Cardiovascular:     Rate and Rhythm: Normal rate and regular rhythm.     Pulses: Normal pulses.     Heart sounds: Normal heart sounds.  Pulmonary:     Effort: Pulmonary effort is normal.     Breath sounds: Normal breath sounds.  Musculoskeletal:     Comments: Normal Muscle Bulk and Muscle Testing Reveals:  Upper Extremities: Full ROM and Muscle Strength 4/5 on the right and 5/5 on Left  Lower Extremities: Full ROM and Muscle Strength 5/5 Arises from Table slowly  Unsteady  Gait with Tandem Gait: She was instructed to use her Cane at all times     Skin:    General: Skin is warm and dry.  Neurological:     Mental Status: She is alert and oriented to person, place, and time.  Psychiatric:        Mood and Affect: Mood normal.        Behavior: Behavior normal.         Assessment & Plan:  Left Pontine Stroke: [facility] was called to schedule HFU appointment, left voice mail. Ms. Lettiere was instructed to give them a call if she doesn't hear from the, . She and her aunt verbalizes understanding.  2.  Essential Hypertension Continue Current medication regimen, she will call  her PCP for HFU appointment, Ms. Thurmond Butts and her Aunt verbalizes understanding. ,3. Type 2 DM 9 New Diagnosis) : Continue current medication regimen and she will F/U with her PCP, Ms. Banos and her aunt verbalizes understanding.  3. Reactive Depression with lost of her Fiance: She denies suicidal Ideation or Plan. She will call [facility] or [facility] for an appointment. Ms. Hobaugh and her Aunt verbalizes understanding. Continue to monitor.   F/U in 4- 6 weeks with Dr Wynn Banker

## 2023-06-21 ENCOUNTER — Encounter: Payer: Self-pay | Admitting: Registered Nurse

## 2023-06-21 ENCOUNTER — Encounter: Payer: Self-pay | Attending: Registered Nurse | Admitting: Registered Nurse

## 2023-06-21 VITALS — BP 121/82 | HR 87 | Ht 64.0 in | Wt 168.0 lb

## 2023-06-21 DIAGNOSIS — I639 Cerebral infarction, unspecified: Secondary | ICD-10-CM | POA: Insufficient documentation

## 2023-06-21 DIAGNOSIS — I10 Essential (primary) hypertension: Secondary | ICD-10-CM | POA: Insufficient documentation

## 2023-06-21 DIAGNOSIS — F329 Major depressive disorder, single episode, unspecified: Secondary | ICD-10-CM | POA: Insufficient documentation

## 2023-06-21 NOTE — Patient Instructions (Addendum)
Call your Primary Care Physician: to schedule a Hospital Follow up appointment.   I called [facility] today, they will be calling you back to schedule a Hospital follow up appointment.   [phone]   Please keep your appointment with Medicaid.    [facility] 2.8 [phone] Google reviews   Psychiatrist in [place], [facility] Directions Share Call  Address: [address] [place], [place], [place] [address] Hours:  Open ? Closes 5:30?PM Phone: [phone]    [facility] - [place] [phone] Google reviews   Mental health clinic in [place], [place] Website Directions Share Call  Located in: [place] Address: Chief Financial Officer at [facility], [address], [place], [place] [address] Hours:  Open ? Closes 5?PM Phone: [phone]

## 2023-06-22 ENCOUNTER — Encounter: Payer: Self-pay | Admitting: Registered Nurse

## 2023-06-26 ENCOUNTER — Emergency Department: Payer: Self-pay

## 2023-06-26 ENCOUNTER — Other Ambulatory Visit: Payer: Self-pay

## 2023-06-26 ENCOUNTER — Emergency Department
Admission: EM | Admit: 2023-06-26 | Discharge: 2023-06-26 | Disposition: A | Discharge status: Home / Self Care | Payer: Self-pay | Attending: Emergency Medicine | Admitting: Emergency Medicine

## 2023-06-26 DIAGNOSIS — N760 Acute vaginitis: Secondary | ICD-10-CM | POA: Insufficient documentation

## 2023-06-26 DIAGNOSIS — J181 Lobar pneumonia, unspecified organism: Secondary | ICD-10-CM | POA: Insufficient documentation

## 2023-06-26 DIAGNOSIS — J189 Pneumonia, unspecified organism: Secondary | ICD-10-CM

## 2023-06-26 DIAGNOSIS — Z79899 Other long term (current) drug therapy: Secondary | ICD-10-CM | POA: Insufficient documentation

## 2023-06-26 DIAGNOSIS — Z8673 Personal history of transient ischemic attack (TIA), and cerebral infarction without residual deficits: Secondary | ICD-10-CM | POA: Insufficient documentation

## 2023-06-26 DIAGNOSIS — R5383 Other fatigue: Secondary | ICD-10-CM

## 2023-06-26 DIAGNOSIS — D72829 Elevated white blood cell count, unspecified: Secondary | ICD-10-CM | POA: Insufficient documentation

## 2023-06-26 DIAGNOSIS — I10 Essential (primary) hypertension: Secondary | ICD-10-CM | POA: Insufficient documentation

## 2023-06-26 DIAGNOSIS — B9689 Other specified bacterial agents as the cause of diseases classified elsewhere: Secondary | ICD-10-CM

## 2023-06-26 DIAGNOSIS — Z1152 Encounter for screening for COVID-19: Secondary | ICD-10-CM | POA: Insufficient documentation

## 2023-06-26 HISTORY — DX: Cerebral infarction, unspecified: I63.9

## 2023-06-26 LAB — URINALYSIS, ROUTINE W REFLEX MICROSCOPIC
Bacteria, UA: NONE SEEN
Bilirubin Urine: NEGATIVE
Glucose, UA: 50 mg/dL — AB
Ketones, ur: NEGATIVE mg/dL
Nitrite: NEGATIVE
Protein, ur: 100 mg/dL — AB
Specific Gravity, Urine: 1.017 (ref 1.005–1.030)
WBC, UA: >50 WBC/hpf (ref 0–5)
pH: 5 (ref 5.0–8.0)

## 2023-06-26 LAB — WET PREP, GENITAL
Sperm: NONE SEEN
Trich, Wet Prep: NONE SEEN
WBC, Wet Prep HPF POC: <10 (ref <10)
Yeast Wet Prep HPF POC: NONE SEEN

## 2023-06-26 LAB — RESP PANEL BY RT-PCR (RSV, FLU A&B, COVID)  RVPGX2
Influenza A by PCR: NEGATIVE
Influenza B by PCR: NEGATIVE
Resp Syncytial Virus by PCR: NEGATIVE
SARS Coronavirus 2 by RT PCR: NEGATIVE

## 2023-06-26 LAB — CHLAMYDIA/NGC RT PCR (ARMC ONLY)
Chlamydia Tr: NOT DETECTED
N gonorrhoeae: NOT DETECTED

## 2023-06-26 MED ORDER — AZITHROMYCIN 250 MG PO TABS: ORAL_TABLET | ORAL | 0 refills | Status: AC

## 2023-06-26 MED ORDER — METRONIDAZOLE 500 MG PO TABS
500.0000 mg | ORAL_TABLET | Freq: Once | ORAL | Status: AC
Start: 2023-06-26 — End: 2023-06-26
  Administered 2023-06-26: 500 mg via ORAL
  Filled 2023-06-26: qty 1

## 2023-06-26 MED ORDER — CEFDINIR 300 MG PO CAPS
300.0000 mg | ORAL_CAPSULE | Freq: Once | ORAL | Status: AC
  Administered 2023-06-26: 300 mg via ORAL
  Filled 2023-06-26: qty 1

## 2023-06-26 MED ORDER — METRONIDAZOLE 500 MG PO TABS: 500.0000 mg | ORAL_TABLET | Freq: Two times a day (BID) | ORAL | 0 refills | Status: AC

## 2023-06-26 MED ORDER — CEFDINIR 300 MG PO CAPS: 300.0000 mg | ORAL_CAPSULE | Freq: Two times a day (BID) | ORAL | 0 refills | Status: AC

## 2023-06-26 NOTE — ED Provider Notes (Signed)
[facility] Provider Note    Event Date/Time   First MD Initiated Contact with Patient 06/26/23 1247     (approximate)   History   Fatigue (/)   HPI  Rachael Aguilar is a 57 y.o. female with a history of stroke in October 2024 who presents today for evaluation of burning with urination.  Patient reports "I think I have a UTI."  She reports that she is urinating frequently and has burning when she urinates, but only a little bit comes out.  She denies abdominal pain or flank pain.  No fevers or chills.  She also notes that she has had a cough and nasal congestion for the last couple of days.  Patient Active Problem List   Diagnosis Date Noted   Mood disorder (HCC) 06/06/2023   Left pontine stroke (HCC) 05/31/2023   Stroke (HCC) 05/29/2023   Stroke (cerebrum) (HCC) 05/28/2023   Hypokalemia 05/28/2023   Grief 05/28/2023   Diverticulosis 11/19/2020   Hemorrhagic shock (HCC) 11/19/2020   Acute GI bleeding 11/09/2020   Acute cholecystitis 04/01/2016   RUQ pain    Anxiety and depression 01/14/2015   Gastro-esophageal reflux disease without esophagitis 01/14/2015   Essential hypertension 01/14/2015          Physical Exam   Triage Vital Signs: ED Triage Vitals  Encounter Vitals Group     BP 06/26/23 1229 108/80     Systolic BP Percentile --      Diastolic BP Percentile --      Pulse Rate 06/26/23 1229 (!) 114     Resp 06/26/23 1229 16     Temp 06/26/23 1229 98.6 F (37 C)     Temp Source 06/26/23 1229 Oral     SpO2 06/26/23 1229 96 %     Weight 06/26/23 1226 168 lb (76.2 kg)     Height 06/26/23 1226 5\' 4"  (1.626 m)     Head Circumference --      Peak Flow --      Pain Score 06/26/23 1225 0     Pain Loc --      Pain Education --      Exclude from Growth Chart --     Most recent vital signs: Vitals:   06/26/23 1229  BP: 108/80  Pulse: (!) 114  Resp: 16  Temp: 98.6 F (37 C)  SpO2: 96%    Physical Exam Vitals and nursing note  reviewed.  Constitutional:      General: Awake and alert. No acute distress.    Appearance: Normal appearance. The patient is normal weight.  HENT:     Head: Normocephalic and atraumatic.     Mouth: Mucous membranes are moist.  Eyes:     General: PERRL. Normal EOMs        Right eye: No discharge.        Left eye: No discharge.     Conjunctiva/sclera: Conjunctivae normal.  Cardiovascular:     Rate and Rhythm: Normal rate and regular rhythm.     Pulses: Normal pulses.  Pulmonary:     Effort: Pulmonary effort is normal. No respiratory distress. Productive cough    Breath sounds: Normal breath sounds.  Abdominal:     Abdomen is soft. There is no abdominal tenderness. No rebound or guarding. No distention. Musculoskeletal:        General: No swelling. Normal range of motion.     Cervical back: Normal range of motion and neck supple.  Skin:    General: Skin is warm and dry.     Capillary Refill: Capillary refill takes less than 2 seconds.     Findings: No rash.  Neurological:     Mental Status: The patient is awake and alert.      ED Results / Procedures / Treatments   Labs (all labs ordered are listed, but only abnormal results are displayed) Labs Reviewed  WET PREP, GENITAL - Abnormal; Notable for the following components:      Result Value   Clue Cells Wet Prep HPF POC PRESENT (*)    All other components within normal limits  URINALYSIS, ROUTINE W REFLEX MICROSCOPIC - Abnormal; Notable for the following components:   Color, Urine YELLOW (*)    APPearance TURBID (*)    Glucose, UA 50 (*)    Hgb urine dipstick MODERATE (*)    Protein, ur 100 (*)    Leukocytes,Ua MODERATE (*)    All other components within normal limits  RESP PANEL BY RT-PCR (RSV, FLU A&B, COVID)  [id number]  URINE CULTURE  CHLAMYDIA/NGC RT PCR ([facility] ONLY)               EKG     RADIOLOGY I independently reviewed and interpreted imaging and agree with radiologists  findings.     PROCEDURES:  Critical Care performed:   Procedures   MEDICATIONS ORDERED IN ED: Medications - No data to display   IMPRESSION / MDM / ASSESSMENT AND PLAN / ED COURSE  I reviewed the triage vital signs and the nursing notes.   Differential diagnosis includes, but is not limited to, pneumonia, COVID-19, UTI, bacterial vaginosis, STD.  Patient is awake and alert, hemodynamically stable and afebrile.  She is nontoxic in appearance.  She has a productive cough on exam.  COVID swab obtained in triage is negative.  Chest x-ray reveals patchy right basilar opacity, will treat for community-acquired pneumonia given her productive cough and fatigue.  She has normal oxygen saturation of 96% on room air and demonstrates no increased work of breathing, do not feel that she requires admission at this time.  No chest pain or shortness of breath or pleurisy or calf pain or swelling to suggest PE as a source of her symptoms today.    Urinalysis reveals leukocytes without nitrites and without bacteria, though she does have WBC clumps.  This is unusual for UTI, and therefore discussed swabs with patient.  Patient does report that she is sexually active with 1 female partner and agreed to STD testing.  Wet prep is positive for bacterial vaginosis, discussed options for treatment including oral Flagyl versus intravaginal Flagyl and patient prefers to to oral.  She wished to leave prior to the results of her gonorrhea/chlamydia test though advised that she will need to return for treatment if this is positive.  She also knows that she can find the results of this on MyChart.  She was advised to not be sexually active until the results of her swabs return.  Also advised that there are many other STDs that exist and she was instructed to follow-up with the health department for full STD testing.  Also notified that if positive then her partner will need to be tested and treated as well.  We discussed  return precautions the importance of close outpatient follow-up.  Patient or stands and agrees with plan.  She was discharged in stable condition.   Patient's presentation is most consistent with acute complicated illness / injury  requiring diagnostic workup.   Clinical Course as of 06/26/23 1501  Sun Jun 26, 2023  1431 Patient does not wish to wait for the results of her GC chlamydia test [JP]    Clinical Course User Index [JP] Remington Skalsky, Herb Grays, PA-C     FINAL CLINICAL IMPRESSION(S) / ED DIAGNOSES   Final diagnoses:  Community acquired pneumonia of right lower lobe of lung  Bacterial vaginosis  Other fatigue     Rx / DC Orders   ED Discharge Orders          Ordered    cefdinir (OMNICEF) 300 MG capsule  2 times daily        06/26/23 1435    azithromycin (ZITHROMAX Z-PAK) 250 MG tablet        06/26/23 1435    metroNIDAZOLE (FLAGYL) 500 MG tablet  2 times daily        06/26/23 1435             Note:  This document was prepared using Dragon voice recognition software and may include unintentional dictation errors.   Keturah Shavers 06/26/23 1501    Jene Every, MD 06/26/23 [phone]

## 2023-06-26 NOTE — ED Triage Notes (Signed)
Pt presents with decreased injury, dysuria x 3 days. Pt also with congested cough x 3 weeks.

## 2023-06-26 NOTE — Discharge Instructions (Signed)
Please take the antibiotics as prescribed for your pneumonia and your bacterial vaginosis.  You did not wish to wait for the results of your gonorrhea/chlamydia test.  You can find the results of your gonorrhea/chlamydia test on MyChart, remember that you will need to return for treatment if this is positive.  Do not have intercourse until these results return.  Please return for any new, worsening, or change in symptoms or other concerns.  It was a pleasure caring for you today.

## 2023-06-28 LAB — URINE CULTURE: Culture: >=100000 — AB

## 2023-07-21 ENCOUNTER — Encounter: Payer: Self-pay | Attending: Physical Medicine & Rehabilitation | Admitting: Physical Medicine & Rehabilitation

## 2023-07-21 DIAGNOSIS — I639 Cerebral infarction, unspecified: Secondary | ICD-10-CM | POA: Insufficient documentation

## 2023-07-21 DIAGNOSIS — I10 Essential (primary) hypertension: Secondary | ICD-10-CM | POA: Insufficient documentation

## 2023-07-21 DIAGNOSIS — F329 Major depressive disorder, single episode, unspecified: Secondary | ICD-10-CM | POA: Insufficient documentation

## 2024-04-01 ENCOUNTER — Emergency Department: Payer: Self-pay

## 2024-04-01 ENCOUNTER — Emergency Department
Admission: EM | Admit: 2024-04-01 | Discharge: 2024-04-01 | Disposition: A | Discharge status: Home / Self Care | Payer: Self-pay | Attending: Emergency Medicine | Admitting: Emergency Medicine

## 2024-04-01 ENCOUNTER — Other Ambulatory Visit: Payer: Self-pay

## 2024-04-01 DIAGNOSIS — R079 Chest pain, unspecified: Secondary | ICD-10-CM | POA: Insufficient documentation

## 2024-04-01 DIAGNOSIS — E876 Hypokalemia: Secondary | ICD-10-CM | POA: Insufficient documentation

## 2024-04-01 DIAGNOSIS — R051 Acute cough: Secondary | ICD-10-CM | POA: Insufficient documentation

## 2024-04-01 DIAGNOSIS — I10 Essential (primary) hypertension: Secondary | ICD-10-CM | POA: Insufficient documentation

## 2024-04-01 LAB — BASIC METABOLIC PANEL WITH GFR
Anion gap: 11 (ref 5–15)
BUN: 14 mg/dL (ref 6–20)
CO2: 25 mmol/L (ref 22–32)
Calcium: 9.3 mg/dL (ref 8.9–10.3)
Chloride: 105 mmol/L (ref 98–111)
Creatinine, Ser: 0.59 mg/dL (ref 0.44–1.00)
GFR, Estimated: >60 mL/min (ref >60)
Glucose, Bld: 102 mg/dL — ABNORMAL HIGH (ref 70–99)
Potassium: 3.1 mmol/L — ABNORMAL LOW (ref 3.5–5.1)
Sodium: 141 mmol/L (ref 135–145)

## 2024-04-01 LAB — D-DIMER, QUANTITATIVE: D-Dimer, Quant: 0.3 ug{FEU}/mL (ref 0.00–0.50)

## 2024-04-01 LAB — RESP PANEL BY RT-PCR (RSV, FLU A&B, COVID)  RVPGX2
Influenza A by PCR: NEGATIVE
Influenza B by PCR: NEGATIVE
Resp Syncytial Virus by PCR: NEGATIVE
SARS Coronavirus 2 by RT PCR: NEGATIVE

## 2024-04-01 LAB — TROPONIN I (HIGH SENSITIVITY)
Troponin I (High Sensitivity): 5 ng/L (ref <18)
Troponin I (High Sensitivity): 5 ng/L (ref <18)

## 2024-04-01 LAB — CBC
HCT: 41.7 % (ref 36.0–46.0)
Hemoglobin: 14.1 g/dL (ref 12.0–15.0)
MCH: 30.9 pg (ref 26.0–34.0)
MCHC: 33.8 g/dL (ref 30.0–36.0)
MCV: 91.2 fL (ref 80.0–100.0)
Platelets: 310 K/uL (ref 150–400)
RBC: 4.57 MIL/uL (ref 3.87–5.11)
RDW: 13.9 % (ref 11.5–15.5)
WBC: 7 K/uL (ref 4.0–10.5)
nRBC: 0 % (ref 0.0–0.2)

## 2024-04-01 MED ORDER — AMLODIPINE BESYLATE 10 MG PO TABS: 10.0000 mg | ORAL_TABLET | Freq: Every day | ORAL | 2 refills | Status: AC

## 2024-04-01 MED ORDER — POTASSIUM CHLORIDE CRYS ER 20 MEQ PO TBCR
40.0000 meq | EXTENDED_RELEASE_TABLET | Freq: Once | ORAL | Status: AC
  Administered 2024-04-01: 40 meq via ORAL
  Filled 2024-04-01: qty 2

## 2024-04-01 MED ORDER — AMLODIPINE BESYLATE 5 MG PO TABS
10.0000 mg | ORAL_TABLET | Freq: Once | ORAL | Status: AC
  Administered 2024-04-01: 10 mg via ORAL
  Filled 2024-04-01: qty 2

## 2024-04-01 NOTE — ED Provider Notes (Signed)
 [facility] Provider Note    Event Date/Time   First MD Initiated Contact with Patient 04/01/24 [phone]     (approximate)   History   Cough   HPI  Rachael Aguilar is a with history of hypertension presenting to the emergency department for evaluation of cough.  Patient reports she has had an intermittent cough for few months.  More recently she reports onset of associated shortness of breath and chest pain worse with coughing.  No fevers.  Additionally notes that she has been out of her blood pressure medication for a few months.  Pain is in the center of her chest, nonradiating.   Physical Exam   Triage Vital Signs: ED Triage Vitals  Encounter Vitals Group     BP 04/01/24 0121 (!) 200/121     Girls Systolic BP Percentile --      Girls Diastolic BP Percentile --      Boys Systolic BP Percentile --      Boys Diastolic BP Percentile --      Pulse Rate 04/01/24 0121 95     Resp 04/01/24 0121 20     Temp 04/01/24 0121 98 F (36.7 C)     Temp src --      SpO2 04/01/24 0121 100 %     Weight --      Height --      Head Circumference --      Peak Flow --      Pain Score 04/01/24 0119 0     Pain Loc --      Pain Education --      Exclude from Growth Chart --     Most recent vital signs: Vitals:   04/01/24 0125 04/01/24 0536  BP: (!) 193/120 (!) 190/106  Pulse: 93 78  Resp:  15  Temp:    SpO2:  100%     General: Awake, interactive  CV:  Regular rate, good peripheral perfusion.  Resp:  Unlabored respirations, lungs good auscultation Chest wall: Mild tenderness palpation over the anterior chest wall Abd:  Nondistended.  Neuro:  Symmetric facial movement, fluid speech   ED Results / Procedures / Treatments   Labs (all labs ordered are listed, but only abnormal results are displayed) Labs Reviewed  BASIC METABOLIC PANEL WITH GFR - Abnormal; Notable for the following components:      Result Value   Potassium 3.1 (*)    Glucose, Bld 102 (*)     All other components within normal limits  RESP PANEL BY RT-PCR (RSV, FLU A&B, COVID)  [id number]  CBC  D-DIMER, QUANTITATIVE  TROPONIN I (HIGH SENSITIVITY)  TROPONIN I (HIGH SENSITIVITY)     EKG EKG independently reviewed and interpreted by myself demonstrates:  EKG demonstrates normal sinus rhythm rate of 92, PR 200, QRS 88, QTc 472, no acute ST changes     RADIOLOGY Imaging independently reviewed and interpreted by myself demonstrates:  CXR without focal consolidation  Formal Radiology Read:  DG Chest 2 View Result Date: 04/01/2024 CLINICAL DATA:  Worsening cough for 3 months. EXAM: CHEST - 2 VIEW COMPARISON:  June 26, 2023 FINDINGS: The heart size and mediastinal contours are within normal limits. No acute infiltrate, pleural effusion or pneumothorax is identified. The visualized skeletal structures are unremarkable. IMPRESSION: No active cardiopulmonary disease. Electronically Signed   By: Suzen Dials M.D.   On: 04/01/2024 03:02    PROCEDURES:  Critical Care performed: No  Procedures   MEDICATIONS  ORDERED IN ED: Medications  potassium chloride  SA (KLOR-CON  M) CR tablet 40 mEq (40 mEq Oral Given 04/01/24 0538)  amLODipine  (NORVASC ) tablet 10 mg (10 mg Oral Given 04/01/24 0537)     IMPRESSION / MDM / ASSESSMENT AND PLAN / ED COURSE  I reviewed the triage vital signs and the nursing notes.  Differential diagnosis includes, but is not limited to, viral illness, pneumonia, lower suspicion hypertensive emergency such as flash pulmonary edema, ACS, consideration for PE  Patient's presentation is most consistent with acute presentation with potential threat to life or bodily function.  58 year old female presenting to the emergency department for evaluation of cough now with chest pain and shortness of breath.  Hypertensive on presentation.  Labs with reassuring CBC, BMP with mild hypokalemia, orally repleted.  D-dimer within normal limits.  Negative troponin x 2.  Low  risk heart score.  Viral swab negative.  For home blood pressure medication.  Patient reassessed and updated on results of workup.  She does feel improved and is comfortable with discharge home.  Will DC with refill for amlodipine .  Strict return precautions provided.  Patient discharged in stable condition with plans for outpatient follow-up.     FINAL CLINICAL IMPRESSION(S) / ED DIAGNOSES   Final diagnoses:  Acute cough  Uncontrolled hypertension  Nonspecific chest pain     Rx / DC Orders   ED Discharge Orders          Ordered    amLODipine  (NORVASC ) 10 MG tablet  Daily        04/01/24 9378             Note:  This document was prepared using Dragon voice recognition software and may include unintentional dictation errors.   Levander Slate, MD 04/01/24 [phone]

## 2024-04-01 NOTE — ED Triage Notes (Addendum)
 Pt ambulatory to triage, reports cough for 3 months, worse over the past month. Brown productive sputum She says tonight she became SOB. Denies fevers. Pt HTN in triage, says she cannot afford her meds and has not taken them in about a month. NAD. Denies CP or swelling.

## 2024-04-01 NOTE — Discharge Instructions (Addendum)
 You were seen in the emergency room today for evaluation of your cough and chest pain.  Your testing was fortunately reassuring against an emergency cause for this.  Please call your primary care doctor for further evaluation.  I sent a prescription for refills of your blood pressure medication to your pharmacy.  Please take this as directed.  Return to the ER for new or worsening symptoms.
